# Patient Record
Sex: Female | Born: 1935 | Race: White | Hispanic: No | Smoking: Former smoker
Health system: Southern US, Community
[De-identification: ages and names within clinical notes are randomized; demographics above are authoritative.]

## PROBLEM LIST (undated history)

## (undated) DIAGNOSIS — N289 Disorder of kidney and ureter, unspecified: Secondary | ICD-10-CM

## (undated) DIAGNOSIS — E039 Hypothyroidism, unspecified: Secondary | ICD-10-CM

## (undated) DIAGNOSIS — C189 Malignant neoplasm of colon, unspecified: Secondary | ICD-10-CM

## (undated) DIAGNOSIS — I359 Nonrheumatic aortic valve disorder, unspecified: Secondary | ICD-10-CM

## (undated) DIAGNOSIS — I495 Sick sinus syndrome: Secondary | ICD-10-CM

## (undated) DIAGNOSIS — E785 Hyperlipidemia, unspecified: Secondary | ICD-10-CM

## (undated) DIAGNOSIS — K219 Gastro-esophageal reflux disease without esophagitis: Secondary | ICD-10-CM

## (undated) DIAGNOSIS — I10 Essential (primary) hypertension: Secondary | ICD-10-CM

## (undated) DIAGNOSIS — I701 Atherosclerosis of renal artery: Secondary | ICD-10-CM

## (undated) DIAGNOSIS — I2510 Atherosclerotic heart disease of native coronary artery without angina pectoris: Secondary | ICD-10-CM

## (undated) DIAGNOSIS — I219 Acute myocardial infarction, unspecified: Secondary | ICD-10-CM

## (undated) DIAGNOSIS — C50919 Malignant neoplasm of unspecified site of unspecified female breast: Secondary | ICD-10-CM

## (undated) HISTORY — DX: Gastro-esophageal reflux disease without esophagitis: K21.9

## (undated) HISTORY — DX: Disorder of kidney and ureter, unspecified: N28.9

## (undated) HISTORY — DX: Malignant neoplasm of colon, unspecified: C18.9

## (undated) HISTORY — PX: OTHER SURGICAL HISTORY: SHX169

## (undated) HISTORY — DX: Hyperlipidemia, unspecified: E78.5

## (undated) HISTORY — PX: ABDOMINAL HYSTERECTOMY: SHX81

## (undated) HISTORY — DX: Atherosclerosis of renal artery: I70.1

## (undated) HISTORY — DX: Sick sinus syndrome: I49.5

## (undated) HISTORY — PX: TONSILLECTOMY: SUR1361

## (undated) HISTORY — PX: AORTIC VALVE REPLACEMENT: SHX41

## (undated) HISTORY — DX: Malignant neoplasm of unspecified site of unspecified female breast: C50.919

## (undated) HISTORY — PX: PACEMAKER INSERTION: SHX728

## (undated) HISTORY — PX: CORONARY ARTERY BYPASS GRAFT: SHX141

## (undated) HISTORY — DX: Essential (primary) hypertension: I10

## (undated) HISTORY — DX: Atherosclerotic heart disease of native coronary artery without angina pectoris: I25.10

## (undated) HISTORY — DX: Hypothyroidism, unspecified: E03.9

## (undated) HISTORY — DX: Nonrheumatic aortic valve disorder, unspecified: I35.9

---

## 2002-01-11 ENCOUNTER — Ambulatory Visit (HOSPITAL_COMMUNITY): Admission: RE | Admit: 2002-01-11 | Discharge: 2002-01-11 | Payer: Self-pay | Admitting: Internal Medicine

## 2002-04-11 ENCOUNTER — Ambulatory Visit (HOSPITAL_COMMUNITY): Admission: RE | Admit: 2002-04-11 | Discharge: 2002-04-11 | Payer: Self-pay | Admitting: Internal Medicine

## 2002-10-23 ENCOUNTER — Inpatient Hospital Stay (HOSPITAL_COMMUNITY): Admission: EM | Admit: 2002-10-23 | Discharge: 2002-10-24 | Payer: Self-pay | Admitting: *Deleted

## 2002-10-23 ENCOUNTER — Encounter: Payer: Self-pay | Admitting: Emergency Medicine

## 2002-10-24 ENCOUNTER — Encounter: Payer: Self-pay | Admitting: Cardiology

## 2003-06-06 ENCOUNTER — Inpatient Hospital Stay (HOSPITAL_COMMUNITY): Admission: EM | Admit: 2003-06-06 | Discharge: 2003-07-04 | Payer: Self-pay | Admitting: Emergency Medicine

## 2003-06-14 ENCOUNTER — Encounter: Payer: Self-pay | Admitting: Cardiology

## 2003-06-15 ENCOUNTER — Encounter (INDEPENDENT_AMBULATORY_CARE_PROVIDER_SITE_OTHER): Payer: Self-pay | Admitting: Specialist

## 2003-06-19 ENCOUNTER — Encounter: Payer: Self-pay | Admitting: Thoracic Surgery (Cardiothoracic Vascular Surgery)

## 2003-06-27 ENCOUNTER — Encounter: Payer: Self-pay | Admitting: Cardiovascular Disease

## 2003-07-09 ENCOUNTER — Inpatient Hospital Stay (HOSPITAL_COMMUNITY): Admission: EM | Admit: 2003-07-09 | Discharge: 2003-07-11 | Payer: Self-pay | Admitting: Emergency Medicine

## 2003-07-24 ENCOUNTER — Ambulatory Visit (HOSPITAL_COMMUNITY): Admission: RE | Admit: 2003-07-24 | Discharge: 2003-07-24 | Payer: Self-pay | Admitting: *Deleted

## 2003-07-31 ENCOUNTER — Ambulatory Visit (HOSPITAL_COMMUNITY): Admission: RE | Admit: 2003-07-31 | Discharge: 2003-07-31 | Payer: Self-pay | Admitting: *Deleted

## 2003-08-10 ENCOUNTER — Encounter (HOSPITAL_COMMUNITY): Admission: RE | Admit: 2003-08-10 | Discharge: 2003-09-09 | Payer: Self-pay | Admitting: *Deleted

## 2003-09-08 ENCOUNTER — Encounter (HOSPITAL_COMMUNITY): Admission: RE | Admit: 2003-09-08 | Discharge: 2003-10-08 | Payer: Self-pay | Admitting: *Deleted

## 2003-10-09 ENCOUNTER — Encounter (HOSPITAL_COMMUNITY): Admission: RE | Admit: 2003-10-09 | Discharge: 2003-11-08 | Payer: Self-pay | Admitting: *Deleted

## 2003-11-10 ENCOUNTER — Encounter (HOSPITAL_COMMUNITY): Admission: RE | Admit: 2003-11-10 | Discharge: 2003-12-10 | Payer: Self-pay | Admitting: *Deleted

## 2003-12-11 ENCOUNTER — Ambulatory Visit (HOSPITAL_COMMUNITY): Admission: RE | Admit: 2003-12-11 | Discharge: 2003-12-11 | Payer: Self-pay | Admitting: Family Medicine

## 2004-02-11 ENCOUNTER — Inpatient Hospital Stay (HOSPITAL_COMMUNITY): Admission: EM | Admit: 2004-02-11 | Discharge: 2004-02-17 | Payer: Self-pay | Admitting: Emergency Medicine

## 2004-07-22 ENCOUNTER — Ambulatory Visit: Payer: Self-pay | Admitting: *Deleted

## 2004-07-22 ENCOUNTER — Ambulatory Visit: Payer: Self-pay | Admitting: Internal Medicine

## 2004-09-04 ENCOUNTER — Ambulatory Visit: Payer: Self-pay | Admitting: Cardiovascular Disease

## 2005-07-23 ENCOUNTER — Ambulatory Visit: Payer: Self-pay | Admitting: *Deleted

## 2005-08-14 ENCOUNTER — Ambulatory Visit: Payer: Self-pay | Admitting: Orthopedic Surgery

## 2005-08-14 ENCOUNTER — Observation Stay (HOSPITAL_COMMUNITY): Admission: EM | Admit: 2005-08-14 | Discharge: 2005-08-16 | Payer: Self-pay | Admitting: Emergency Medicine

## 2005-08-21 ENCOUNTER — Ambulatory Visit: Payer: Self-pay | Admitting: Orthopedic Surgery

## 2005-08-25 ENCOUNTER — Ambulatory Visit: Payer: Self-pay | Admitting: Orthopedic Surgery

## 2005-09-04 ENCOUNTER — Ambulatory Visit: Payer: Self-pay | Admitting: Cardiology

## 2005-10-01 ENCOUNTER — Ambulatory Visit: Payer: Self-pay | Admitting: Orthopedic Surgery

## 2005-10-13 ENCOUNTER — Encounter (HOSPITAL_COMMUNITY): Admission: RE | Admit: 2005-10-13 | Discharge: 2005-11-12 | Payer: Self-pay | Admitting: Orthopedic Surgery

## 2005-10-29 ENCOUNTER — Ambulatory Visit: Payer: Self-pay | Admitting: Orthopedic Surgery

## 2005-11-14 ENCOUNTER — Encounter (HOSPITAL_COMMUNITY): Admission: RE | Admit: 2005-11-14 | Discharge: 2005-12-14 | Payer: Self-pay | Admitting: Orthopedic Surgery

## 2006-01-07 ENCOUNTER — Ambulatory Visit: Payer: Self-pay | Admitting: Internal Medicine

## 2006-02-04 ENCOUNTER — Ambulatory Visit: Payer: Self-pay | Admitting: Internal Medicine

## 2006-03-11 ENCOUNTER — Ambulatory Visit: Payer: Self-pay | Admitting: Internal Medicine

## 2006-04-08 ENCOUNTER — Ambulatory Visit: Payer: Self-pay | Admitting: Internal Medicine

## 2006-04-09 ENCOUNTER — Ambulatory Visit: Payer: Self-pay | Admitting: Cardiology

## 2006-04-22 ENCOUNTER — Ambulatory Visit: Payer: Self-pay

## 2006-04-22 ENCOUNTER — Encounter: Payer: Self-pay | Admitting: Cardiology

## 2006-05-13 ENCOUNTER — Ambulatory Visit: Payer: Self-pay | Admitting: Internal Medicine

## 2006-05-20 ENCOUNTER — Ambulatory Visit: Payer: Self-pay | Admitting: Cardiology

## 2006-06-10 ENCOUNTER — Ambulatory Visit: Payer: Self-pay | Admitting: Internal Medicine

## 2006-07-08 ENCOUNTER — Ambulatory Visit: Payer: Self-pay | Admitting: Internal Medicine

## 2006-08-12 ENCOUNTER — Ambulatory Visit: Payer: Self-pay | Admitting: Internal Medicine

## 2006-09-02 ENCOUNTER — Ambulatory Visit: Payer: Self-pay | Admitting: Internal Medicine

## 2006-12-04 ENCOUNTER — Ambulatory Visit: Payer: Self-pay | Admitting: Internal Medicine

## 2007-03-03 ENCOUNTER — Ambulatory Visit: Payer: Self-pay | Admitting: Internal Medicine

## 2007-06-02 ENCOUNTER — Ambulatory Visit: Payer: Self-pay | Admitting: Internal Medicine

## 2007-06-03 ENCOUNTER — Ambulatory Visit: Payer: Self-pay | Admitting: Cardiology

## 2007-06-03 LAB — CONVERTED CEMR LAB
AST: 18 units/L (ref 0–37)
BUN: 26 mg/dL — ABNORMAL HIGH (ref 6–23)
Bilirubin, Direct: 0.1 mg/dL (ref 0.0–0.3)
CO2: 23 meq/L (ref 19–32)
Calcium: 8.8 mg/dL (ref 8.4–10.5)
Chloride: 110 meq/L (ref 96–112)
Cholesterol: 124 mg/dL (ref 0–200)
Creatinine, Ser: 1.7 mg/dL — ABNORMAL HIGH (ref 0.4–1.2)
GFR calc non Af Amer: 31 mL/min
Sodium: 139 meq/L (ref 135–145)
Total Bilirubin: 0.7 mg/dL (ref 0.3–1.2)
Total CHOL/HDL Ratio: 2.5
Total Protein: 6.8 g/dL (ref 6.0–8.3)

## 2007-06-15 ENCOUNTER — Encounter: Payer: Self-pay | Admitting: Cardiology

## 2007-06-15 ENCOUNTER — Ambulatory Visit: Payer: Self-pay

## 2007-08-27 ENCOUNTER — Ambulatory Visit: Payer: Self-pay | Admitting: Internal Medicine

## 2007-11-24 ENCOUNTER — Ambulatory Visit: Payer: Self-pay | Admitting: Internal Medicine

## 2008-03-22 ENCOUNTER — Ambulatory Visit: Payer: Self-pay | Admitting: Cardiology

## 2008-03-31 ENCOUNTER — Ambulatory Visit: Payer: Self-pay | Admitting: Cardiology

## 2008-03-31 ENCOUNTER — Encounter: Payer: Self-pay | Admitting: Cardiology

## 2008-03-31 ENCOUNTER — Ambulatory Visit: Payer: Self-pay

## 2008-03-31 LAB — CONVERTED CEMR LAB
ALT: 15 units/L (ref 0–35)
Albumin: 3.8 g/dL (ref 3.5–5.2)
Alkaline Phosphatase: 60 units/L (ref 39–117)
CO2: 23 meq/L (ref 19–32)
Calcium: 8.5 mg/dL (ref 8.4–10.5)
Cholesterol: 132 mg/dL (ref 0–200)
Creatinine, Ser: 1.7 mg/dL — ABNORMAL HIGH (ref 0.4–1.2)
GFR calc Af Amer: 37 mL/min
GFR calc non Af Amer: 30 mL/min
LDL Cholesterol: 61 mg/dL (ref 0–99)
Total Bilirubin: 0.7 mg/dL (ref 0.3–1.2)
Total Protein: 6.5 g/dL (ref 6.0–8.3)
Triglycerides: 95 mg/dL (ref 0–149)
VLDL: 19 mg/dL (ref 0–40)

## 2008-06-29 ENCOUNTER — Ambulatory Visit: Payer: Self-pay | Admitting: Internal Medicine

## 2008-08-18 ENCOUNTER — Encounter: Payer: Self-pay | Admitting: Internal Medicine

## 2008-08-22 ENCOUNTER — Ambulatory Visit: Payer: Self-pay | Admitting: Internal Medicine

## 2008-11-06 ENCOUNTER — Encounter (INDEPENDENT_AMBULATORY_CARE_PROVIDER_SITE_OTHER): Payer: Self-pay | Admitting: *Deleted

## 2008-11-22 ENCOUNTER — Ambulatory Visit: Payer: Self-pay | Admitting: Internal Medicine

## 2008-11-30 ENCOUNTER — Encounter: Payer: Self-pay | Admitting: Internal Medicine

## 2008-12-23 DIAGNOSIS — I2510 Atherosclerotic heart disease of native coronary artery without angina pectoris: Secondary | ICD-10-CM | POA: Insufficient documentation

## 2008-12-23 DIAGNOSIS — C189 Malignant neoplasm of colon, unspecified: Secondary | ICD-10-CM | POA: Insufficient documentation

## 2008-12-23 DIAGNOSIS — C50919 Malignant neoplasm of unspecified site of unspecified female breast: Secondary | ICD-10-CM | POA: Insufficient documentation

## 2008-12-23 DIAGNOSIS — I498 Other specified cardiac arrhythmias: Secondary | ICD-10-CM

## 2008-12-23 DIAGNOSIS — E039 Hypothyroidism, unspecified: Secondary | ICD-10-CM

## 2008-12-23 DIAGNOSIS — K219 Gastro-esophageal reflux disease without esophagitis: Secondary | ICD-10-CM | POA: Insufficient documentation

## 2008-12-23 DIAGNOSIS — R0989 Other specified symptoms and signs involving the circulatory and respiratory systems: Secondary | ICD-10-CM | POA: Insufficient documentation

## 2008-12-23 DIAGNOSIS — I10 Essential (primary) hypertension: Secondary | ICD-10-CM | POA: Insufficient documentation

## 2008-12-23 DIAGNOSIS — I459 Conduction disorder, unspecified: Secondary | ICD-10-CM

## 2008-12-23 DIAGNOSIS — M810 Age-related osteoporosis without current pathological fracture: Secondary | ICD-10-CM | POA: Insufficient documentation

## 2008-12-23 DIAGNOSIS — E785 Hyperlipidemia, unspecified: Secondary | ICD-10-CM | POA: Insufficient documentation

## 2008-12-23 DIAGNOSIS — N189 Chronic kidney disease, unspecified: Secondary | ICD-10-CM

## 2008-12-29 ENCOUNTER — Ambulatory Visit: Payer: Self-pay | Admitting: Cardiology

## 2008-12-29 DIAGNOSIS — Z954 Presence of other heart-valve replacement: Secondary | ICD-10-CM | POA: Insufficient documentation

## 2008-12-29 DIAGNOSIS — I079 Rheumatic tricuspid valve disease, unspecified: Secondary | ICD-10-CM | POA: Insufficient documentation

## 2008-12-29 DIAGNOSIS — Z950 Presence of cardiac pacemaker: Secondary | ICD-10-CM | POA: Insufficient documentation

## 2008-12-29 DIAGNOSIS — R0602 Shortness of breath: Secondary | ICD-10-CM

## 2008-12-29 DIAGNOSIS — I701 Atherosclerosis of renal artery: Secondary | ICD-10-CM

## 2009-01-16 ENCOUNTER — Telehealth (INDEPENDENT_AMBULATORY_CARE_PROVIDER_SITE_OTHER): Payer: Self-pay | Admitting: Radiology

## 2009-01-17 ENCOUNTER — Ambulatory Visit: Payer: Self-pay

## 2009-01-17 ENCOUNTER — Ambulatory Visit: Payer: Self-pay | Admitting: Cardiology

## 2009-01-17 ENCOUNTER — Encounter: Payer: Self-pay | Admitting: Cardiovascular Disease

## 2009-02-07 ENCOUNTER — Telehealth: Payer: Self-pay | Admitting: Cardiology

## 2009-02-21 ENCOUNTER — Ambulatory Visit: Payer: Self-pay | Admitting: Internal Medicine

## 2009-02-23 ENCOUNTER — Encounter: Payer: Self-pay | Admitting: Internal Medicine

## 2009-03-28 ENCOUNTER — Encounter: Payer: Self-pay | Admitting: Cardiology

## 2009-03-29 ENCOUNTER — Encounter: Payer: Self-pay | Admitting: Internal Medicine

## 2009-04-02 ENCOUNTER — Ambulatory Visit (HOSPITAL_COMMUNITY): Admission: RE | Admit: 2009-04-02 | Discharge: 2009-04-02 | Payer: Self-pay | Admitting: Family Medicine

## 2009-05-23 ENCOUNTER — Ambulatory Visit: Payer: Self-pay | Admitting: Internal Medicine

## 2009-05-28 ENCOUNTER — Encounter: Payer: Self-pay | Admitting: Internal Medicine

## 2009-09-19 ENCOUNTER — Ambulatory Visit: Payer: Self-pay | Admitting: Internal Medicine

## 2009-10-16 ENCOUNTER — Ambulatory Visit: Payer: Self-pay | Admitting: Cardiology

## 2009-11-02 ENCOUNTER — Ambulatory Visit (HOSPITAL_COMMUNITY): Admission: RE | Admit: 2009-11-02 | Discharge: 2009-11-02 | Payer: Self-pay | Admitting: Cardiology

## 2009-11-02 ENCOUNTER — Ambulatory Visit: Payer: Self-pay | Admitting: Cardiovascular Disease

## 2009-11-02 ENCOUNTER — Encounter: Payer: Self-pay | Admitting: Cardiology

## 2009-11-02 ENCOUNTER — Ambulatory Visit: Payer: Self-pay

## 2009-11-14 ENCOUNTER — Telehealth: Payer: Self-pay | Admitting: Cardiology

## 2009-12-19 ENCOUNTER — Ambulatory Visit: Payer: Self-pay | Admitting: Internal Medicine

## 2010-01-01 ENCOUNTER — Encounter: Payer: Self-pay | Admitting: Internal Medicine

## 2010-03-27 ENCOUNTER — Ambulatory Visit: Payer: Self-pay | Admitting: Internal Medicine

## 2010-03-28 ENCOUNTER — Encounter: Payer: Self-pay | Admitting: Internal Medicine

## 2010-07-02 ENCOUNTER — Encounter (INDEPENDENT_AMBULATORY_CARE_PROVIDER_SITE_OTHER): Payer: Self-pay | Admitting: *Deleted

## 2010-07-05 ENCOUNTER — Ambulatory Visit
Admission: RE | Admit: 2010-07-05 | Discharge: 2010-07-05 | Payer: Self-pay | Source: Home / Self Care | Attending: Internal Medicine | Admitting: Internal Medicine

## 2010-07-05 ENCOUNTER — Encounter: Payer: Self-pay | Admitting: Internal Medicine

## 2010-07-21 LAB — CONVERTED CEMR LAB
BUN: 29 mg/dL — ABNORMAL HIGH (ref 6–23)
CO2: 25 meq/L (ref 19–32)
Creatinine, Ser: 1.4 mg/dL — ABNORMAL HIGH (ref 0.4–1.2)

## 2010-07-23 NOTE — Progress Notes (Signed)
Summary: refill meds   Phone Note Refill Request Call back at Home Phone [phone] Message from:  Patient on Nov 14, 2009 11:03 AM  Refills Requested: Medication #1:  LISINOPRIL 20 MG TABS Take one tablet by mouth daily  Medication #2:  NIASPAN 1000 MG CR-TABS 1 tab by mouth once daily medco 90 suppy with 3 refill.    Method Requested: Fax to [facility] Initial call taken by: Neil Crouch,  Nov 14, 2009 11:04 AM    Prescriptions: LISINOPRIL 20 MG TABS (LISINOPRIL) Take one tablet by mouth daily  #90 x 3   Entered by:   Burnett Kanaris   Authorized by:   Colin Mulders, MD, [facility]   Signed by:   Burnett Kanaris on 11/14/2009   Method used:   Electronically to        [facility] (mail-order)             ,          Ph: [phone]       Fax: [phone]   [id number] [place] 1000 MG CR-TABS (NIACIN (ANTIHYPERLIPIDEMIC)) 1 tab by mouth once daily  #90 x 3   Entered by:   Burnett Kanaris   Authorized by:   Colin Mulders, MD, [facility]   Signed by:   Burnett Kanaris on 11/14/2009   Method used:   Electronically to        [facility] (mail-order)             ,          Ph: [phone]       Fax: [phone]   [id number]
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
## 2010-07-23 NOTE — Letter (Signed)
Summary: Remote Device Check  [facility], [place]  [id number] [place]   [place], [place] [address]   Phone: [phone]  Fax: [phone]     January 01, 2010 MRN: [id number]   Alexandra Hall, [place]  [address]   Dear [name],   Your remote transmission was recieved and reviewed by your physician.  All diagnostics were within normal limits for you.   __X____Your next office visit is scheduled for:   September 2011 with Dr Lovena Le.                            Please call our office to schedule an appointment.    Sincerely,  Shelly Bombard

## 2010-07-23 NOTE — Cardiovascular Report (Signed)
Summary: Office Visit Remote   Office Visit Remote   Imported By: Sallee Provencal 01/02/2010 12:07:19  _____________________________________________________________________  External Attachment:    Type:   Image     Comment:   External Document
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
## 2010-07-25 NOTE — Letter (Signed)
Summary: Device-Delinquent Phone Proofreader, [place]  [address] [place]   [place], [place] [address]   Phone: [phone]  Fax: [phone]     July 02, 2010 MRN: [id number]   [place] [address] [place], [place]  [address]   Dear Ms. DENHOLM,  According to our records, you were scheduled for a device phone transmission on 06-27-2010.     We did not receive any results from this check.  If you transmitted on your scheduled day, please call us to help troubleshoot your system.  If you forgot to send your transmission, please send one upon receipt of this letter.  Thank you,   [facility]

## 2010-08-04 ENCOUNTER — Encounter (INDEPENDENT_AMBULATORY_CARE_PROVIDER_SITE_OTHER): Payer: Self-pay | Admitting: *Deleted

## 2010-08-14 NOTE — Letter (Signed)
Summary: Remote Device Check  [facility], [place]  [id number] [place]   [place], [place] [address]   Phone: [phone]  Fax: [phone]     August 04, 2010 MRN: [id number]   [place], [place]  [address]   Dear Ms. MUNDIE,   Your remote transmission was recieved and reviewed by your physician.  All diagnostics were within normal limits for you.  __X___Your next transmission is scheduled for: 10-03-2010.  Please transmit at any time this day.  If you have a wireless device your transmission will be sent automatically.   Sincerely,  Shelly Bombard

## 2010-08-14 NOTE — Cardiovascular Report (Signed)
Summary: Office Visit Remote   Office Visit Remote   Imported By: Sallee Provencal 08/06/2010 14:58:59  _____________________________________________________________________  External Attachment:    Type:   Image     Comment:   External Document

## 2010-10-03 ENCOUNTER — Other Ambulatory Visit: Payer: Self-pay | Admitting: Internal Medicine

## 2010-10-03 ENCOUNTER — Ambulatory Visit (INDEPENDENT_AMBULATORY_CARE_PROVIDER_SITE_OTHER): Payer: Medicare Other | Admitting: *Deleted

## 2010-10-03 DIAGNOSIS — I495 Sick sinus syndrome: Secondary | ICD-10-CM

## 2010-10-08 ENCOUNTER — Encounter: Payer: Self-pay | Admitting: Cardiology

## 2010-10-09 ENCOUNTER — Ambulatory Visit (INDEPENDENT_AMBULATORY_CARE_PROVIDER_SITE_OTHER): Payer: Medicare Other | Admitting: Cardiology

## 2010-10-09 ENCOUNTER — Encounter: Payer: Self-pay | Admitting: Cardiology

## 2010-10-09 DIAGNOSIS — I359 Nonrheumatic aortic valve disorder, unspecified: Secondary | ICD-10-CM

## 2010-10-09 DIAGNOSIS — I701 Atherosclerosis of renal artery: Secondary | ICD-10-CM

## 2010-10-09 MED ORDER — LISINOPRIL 40 MG PO TABS: 40.0000 mg | ORAL_TABLET | Freq: Every day | ORAL | Status: DC

## 2010-10-09 NOTE — Assessment & Plan Note (Signed)
Continue statin. Lipids and liver monitored by primary care. 

## 2010-10-09 NOTE — Assessment & Plan Note (Signed)
Continued SBE prophylaxis. Followup echocardiogram both for AVR and tricuspid valve repair.

## 2010-10-09 NOTE — Assessment & Plan Note (Signed)
Management per electrophysiology. 

## 2010-10-09 NOTE — Progress Notes (Signed)
HPI:Alexandra Hall is a very pleasant  who I have seen in the past for coronary artery disease status post coronary bypass graft, history of aortic valve replacement, as well as tricuspid valve repair.  This was performed in December of 2004. Most recent Myoview was performed in July 2010.  At that time her ejection fraction was 64% and there was no ischemia or infarction. Last echocardiogram was performed in May of 2011. At that time her LV function was normal. The prosthetic aortic valve was functioning appropriately with no AI;  mean gradient of 22 mm of mercury; no TR. She also has a history of pacemaker. Renal Dopplers in July of 2010 revealed very mild 1-59% bilateral stenosis.  I last saw her in April of 2011. Since then she has dyspnea on exertion but unchanged compared to previous. It is relieved with rest. There is no associated chest pain. There is no orthopnea, PND, palpitations or syncope. Mild pedal edema.   Current Outpatient Prescriptions  Medication Sig Dispense Refill  . aspirin 81 MG tablet Take 81 mg by mouth daily.        Marland Kitchen azithromycin (ZITHROMAX) 250 MG tablet Take 2 tablets by mouth on day 1, followed by 1 tablet by mouth daily for 4 days.      Marland Kitchen CALCIUM-VITAMIN D PO Take 1 tablet by mouth daily.        . chlorthalidone (HYGROTON) 25 MG tablet Take 25 mg by mouth daily.        . diphenhydrAMINE (BENADRYL) 25 mg capsule Take 25 mg by mouth at bedtime as needed.        . ezetimibe-simvastatin (VYTORIN) 10-40 MG per tablet Take 1 tablet by mouth at bedtime.        . ibandronate (BONIVA) 150 MG tablet Take 150 mg by mouth every 30 (thirty) days. Take in the morning with a full glass of water, on an empty stomach, and do not take anything else by mouth or lie down for the next 30 min.       Marland Kitchen levothyroxine (SYNTHROID, LEVOTHROID) 88 MCG tablet Take 88 mcg by mouth daily.        Marland Kitchen lisinopril (PRINIVIL,ZESTRIL) 20 MG tablet Take 20 mg by mouth daily.        . metoprolol tartrate  (LOPRESSOR) 25 MG tablet Take 25 mg by mouth 2 (two) times daily.        . niacin (NIASPAN) 1000 MG CR tablet Take 1,000 mg by mouth at bedtime.        . vitamin B-12 (CYANOCOBALAMIN) 1000 MCG tablet Take 1,000 mcg by mouth daily.           Past Medical History  Diagnosis Date  . CAD (coronary artery disease)   . Hypothyroidism   . Renal insufficiency   . Colon cancer   . HTN (hypertension)   . HLD (hyperlipidemia)   . GERD (gastroesophageal reflux disease)   . Osteoporosis   . Breast cancer   . Sinus node dysfunction   . Renal artery stenosis     Past Surgical History  Procedure Date  . Coronary artery bypass graft     12/04  . Aortic valve replacement     12/04  . Tricuspid valve repair     12/04  . Pacemaker insertion     History   Social History  . Marital Status: Widowed    Spouse Name: N/A    Number of Children: N/A  . Years of Education:  N/A   Occupational History  . Not on file.   Social History Main Topics  . Smoking status: Never Smoker   . Smokeless tobacco: Not on file  . Alcohol Use: No  . Drug Use: Not on file  . Sexually Active: Not on file   Other Topics Concern  . Not on file   Social History Narrative  . No narrative on file    ROS: no fevers or chills, productive cough, hemoptysis, dysphasia, odynophagia, melena, hematochezia, dysuria, hematuria, rash, seizure activity, orthopnea, PND,  claudication. Remaining systems are negative.  Physical Exam: Well-developed well-nourished in no acute distress.  Skin is warm and dry.  HEENT is normal.  Neck is supple. No thyromegaly.  Chest is clear to auscultation with normal expansion.  Cardiovascular exam is regular rate and rhythm. 2/6 SM left sternal border. Abdominal exam nontender or distended. No masses palpated. Extremities show no edema. neuro grossly intact  ECG  Atrial paced rhythm, left axis deviation,prior anterior infarct.

## 2010-10-09 NOTE — Assessment & Plan Note (Signed)
Repeat echo

## 2010-10-09 NOTE — Assessment & Plan Note (Signed)
Schedule follow-up renal Dopplers. 

## 2010-10-09 NOTE — Assessment & Plan Note (Signed)
Blood pressure elevated. Increase lisinopril to 40 mg p.o. Daily. Check potassium and renal function in one week.

## 2010-10-09 NOTE — Patient Instructions (Signed)
Your physician recommends that you schedule a follow-up appointment in: [place] LISINOPRIL TO 40MG  ONCE DAILY  Your physician has requested that you have an echocardiogram. Echocardiography is a painless test that uses sound waves to create images of your heart. It provides your doctor with information about the size and shape of your heart and how well your heart's chambers and valves are working. This procedure takes approximately one hour. There are no restrictions for this procedure.    Your physician has requested that you have a renal artery duplex. During this test, an ultrasound is used to evaluate blood flow to the kidneys. Allow one hour for this exam. Do not eat after midnight the day before and avoid carbonated beverages. Take your medications as you usually do.   Your physician recommends that you return for lab work in: [place]

## 2010-10-09 NOTE — Assessment & Plan Note (Signed)
Continue aspirin and statin. 

## 2010-10-10 NOTE — Progress Notes (Signed)
Pacer remote

## 2010-10-16 ENCOUNTER — Other Ambulatory Visit: Payer: Self-pay | Admitting: Cardiology

## 2010-10-16 LAB — BASIC METABOLIC PANEL
BUN: 28 mg/dL — ABNORMAL HIGH (ref 6–23)
CO2: 23 mEq/L (ref 19–32)
Calcium: 9 mg/dL (ref 8.4–10.5)
Chloride: 101 mEq/L (ref 96–112)
Creat: 1.49 mg/dL — ABNORMAL HIGH (ref 0.40–1.20)
Glucose, Bld: 99 mg/dL (ref 70–99)
Potassium: 4.5 mEq/L (ref 3.5–5.3)
Sodium: 135 mEq/L (ref 135–145)

## 2010-10-22 ENCOUNTER — Encounter: Payer: Self-pay | Admitting: *Deleted

## 2010-10-24 ENCOUNTER — Ambulatory Visit (HOSPITAL_COMMUNITY): Payer: Medicare Other | Attending: Cardiology | Admitting: Radiology

## 2010-10-24 ENCOUNTER — Encounter (INDEPENDENT_AMBULATORY_CARE_PROVIDER_SITE_OTHER): Payer: Medicare Other | Admitting: Cardiology

## 2010-10-24 DIAGNOSIS — I2510 Atherosclerotic heart disease of native coronary artery without angina pectoris: Secondary | ICD-10-CM

## 2010-10-24 DIAGNOSIS — I359 Nonrheumatic aortic valve disorder, unspecified: Secondary | ICD-10-CM | POA: Insufficient documentation

## 2010-10-24 DIAGNOSIS — I701 Atherosclerosis of renal artery: Secondary | ICD-10-CM

## 2010-10-29 ENCOUNTER — Encounter: Payer: Self-pay | Admitting: Cardiology

## 2010-11-05 NOTE — Assessment & Plan Note (Signed)
Alexandra OFFICE NOTE   Hall, Alexandra Hall                       MRN:          [id number]  DATE:08/27/2007                            DOB:          May 07, 1922    Alexandra Hall returns today for follow-up.  She is a very pleasant elderly  woman with history of symptomatic bradycardia status post pacemaker  insertion.  She also has a history of aortic valve replacement in the  past.  She returns today for follow-up.  She denies chest pain or  shortness of breath.  She had no specific complaints today except for  problems with her foot swelling up, which has resolved.   MEDICATIONS:  1. Synthroid 112 mcg daily.  2. Aspirin 81 a day.  3. Metoprolol 25 twice daily.  4. Lisinopril 20 a day.  5. Vytorin 10/40 daily.  6. Niaspan 1 gram a day.  7. Chlorthalidone 25 a day.   PHYSICAL EXAMINATION:  GENERAL APPEARANCE:  She is a pleasant elderly-  appearing woman in no acute distress.  VITAL SIGNS:  Blood pressure today was 160/85, the pulse was 80 and  regular, respirations were 18, weight was 155 pounds.  NECK:  No jugular distention.  LUNGS:  Clear bilaterally to auscultation.  No wheezes, rales or rhonchi  are present.  CARDIAC:  Exam revealed a regular rate and rhythm with normal S1 and S2.  EXTREMITIES:  Trace edema on the right, none on the left, no cyanosis or  clubbing.   Interrogation of her pacemaker demonstrates a Medtronic Kappa 900.  The  P-waves were not available secondary to underlying sinus node  dysfunction, the R-waves were 15, the impedance 441 in the atrium, 671  the ventricle threshold 0.75 at 0.4 in the A and 0.5 at 0.4 in the V.  Battery voltage was 2.77 volts.  She was 99.5% A paced.   IMPRESSION:  1. Sinus node dysfunction.  2. History of heart block.  3. Status post AVR   DISCUSSION:  Overall Alexandra Hall is stable and her pacemaker is working  normally.  Will see her back in the  office in 1 year for pacemaker  follow-up.    Champ Mungo. Lovena Le, MD  Electronically Signed   GWT/MedQ  DD: 08/27/2007  DT: 08/29/2007  Job #: [id number]

## 2010-11-05 NOTE — Assessment & Plan Note (Signed)
[facility] OFFICE NOTE   Alexandra, Hall                       MRN:          [id number]  DATE:03/22/2008                            DOB:          01-04-1922    [name] is a very pleasant 86-year female who I have seen in the past  for coronary artery disease status post coronary bypass graft, history  of aortic valve replacement, as well as tricuspid valve repair.  Most  recent Myoview was performed on April 22, 2006.  At that time her  ejection fraction was 56% and there was no ischemia or infarction.  Since I last saw her she apparently is doing well from a symptomatic  standpoint with no dyspnea, chest pain, palpitations, or syncope, and  there is no pedal edema.   MEDICATIONS:  Include,  1. Chlorthalidone 25 mg tablets 2 p.o. daily.  2. Synthroid 125 mcg p.o. daily.  3. Prilosec 20 mg p.o. daily.  4. Benadryl.  5. Vytorin 10/40 daily.  6. Lisinopril 20 mg p.o. daily.  7. Boniva.  8. Niaspan 1 g p.o. daily.  9. Aspirin 81 mg p.o. daily.  10.Lopressor 25 mg p.o. b.i.d.   PHYSICAL EXAMINATION:  VITAL SIGNS:  Today shows a blood pressure that  is elevated to 150/90 and pulse is 77.  HEENT:  Normal.  NECK:  Supple.  CHEST:  Clear.  CARDIOVASCULAR:  Regular rate and rhythm.  There is a 2/6 systolic  murmur at the left sternal border.  There is no diastolic murmur noted.  ABDOMEN:  No tenderness.  EXTREMITIES:  No edema.   Electrocardiogram shows atrial pacing at a rate of 77.  There are no  significant ST changes noted.   DIAGNOSES:  1. Coronary artery disease status post coronary artery bypass graft -      the patient has had no chest pain and we will continue with medical      therapy including her aspirin, statin, ACE inhibitor, and beta-      blocker.  2. Status post aortic valve replacement with pericardial tissue valve      - we will plan to repeat her echocardiogram.  She will  continue      with SBE prophylaxis and we have provided a card for her use today.  3. History of tricuspid valve repair - as per status post aortic valve      replacement with pericardial tissue valve.  4. Hypertension - her blood pressure is elevated today.  However,      daughter has kept records of her pressures at home and they are      adequately controlled on present medications.  We will check her      BMET to follow her potassium and renal function.  5. Hyperlipidemia - she will continue on statin and Niaspan and I will      check lipids and liver.  6. History of intolerance to Lipitor.  7. Status post pacemaker placement - she will follow up with Dr.      Lovena Le concerning this issue.  8. Hypothyroidism.  9. Renal insufficiency.  10.History of gastroesophageal reflux disease.   She will see [place] back in 9 months.     Alexandra Hall Alexandra Breed, MD, Alexandra Hall  Electronically Signed    BSC/MedQ  DD: 03/22/2008  DT: 03/23/2008  Job #: [id number]   cc:   Alexandra Reaper A. Wolfgang Phoenix, MD

## 2010-11-05 NOTE — Assessment & Plan Note (Signed)
[place]                            CARDIOLOGY OFFICE NOTE   [name], [name]                       MRN:          [id number]  DATE:06/03/2007                            DOB:          1921-11-27    Alexandra Hall returns for followup today.  She is a very pleasant female  whom I follow for coronary artery disease status post coronary artery  bypass grafting, status post aortic valve replacement and tricuspid  valve repair.  Since I last saw her, she denies any dyspnea, other than  with more moderate activities.  There is no orthopnea, PND, pedal edema,  pre-syncope, syncope, or chest pain.  The most recent Myoview was  performed in October of 2007.  At that time, her left ventricular  function was normal with an ejection fraction of 56% and there was  normal perfusion.   MEDICATIONS AT PRESENT:  1. Amlodipine 2.5 mg p.o. daily.  2. Chlorthalidone 25 mg p.o. daily.  3. Toprol 25 mg p.o. daily.  4. Synthroid 112 mcg p.o. daily.  5. Prilosec.  6. Benadryl.  7. Vytorin 10/40 daily.  8. Lisinopril 20 mg p.o. daily.  9. Boniva 150 mg monthly.  10.Niaspan 1 g p.o. daily.   PHYSICAL EXAM:  Shows a blood pressure of 143/78, and pulse is 87.  She  weighs 158 pounds.  HEENT:  Normal.  NECK:  Supple.  CHEST:  Clear.  CARDIOVASCULAR:  Regular rate and rhythm.  There is a 1/6 systolic  ejection murmur at the left sternal border.  There is no diastolic  murmur.  Note, she also has bilateral carotid bruits.  ABDOMEN:  No tenderness.  EXTREMITIES:  No edema.   Her electrocardiogram shows atrial pacing at a rate of 80.  There is  left axis deviation.  There are nonspecific ST changes.   DIAGNOSES:  1. Coronary artery disease status post coronary artery bypass grafting      - she has had no chest pain and her Myoview 1 year ago showed      normal perfusion.  We will continue with medical therapy, including      her ACE inhibitor, Toprol, and a Statin.   I have asked her to begin      aspirin 81 mg p.o. daily.  2. Status post aortic valve replacement with pericardial tissue valve      - we will plan to repeat her echocardiogram.  She will continue      with subacute bacterial endocarditis prophylaxis.  3. Status post tricuspid valve repair - as per #2.  4. Hyperthyroidism - her blood pressure is mildly elevated today.      However, they state it runs in the normal range at home.  I have      asked her to track this and we can increase her amlodipine as      needed.  5. Hyperlipidemia - she will continue on her Statin and Niaspan.  We      will check lipids and liver today and adjust as indicated.  We will  also check a BMET given her diuretic use.  6. History of intolerance to Lipitor.  7. Status post pacemaker placement secondary to sick sinus syndrome -      she will follow up with Dr. Lovena Le.  8. Hypothyroidism.  9. Renal insufficiency.  10.History of gastroesophageal reflux disease.  11.Carotid bruits - we will recheck carotid Dopplers.   I will see her back in 9 months.     Denice Bors Stanford Breed, MD, [facility]  Electronically Signed    BSC/MedQ  DD: 06/03/2007  DT: 06/03/2007  Job #: [id number]   cc:   Nicki Reaper A. Wolfgang Phoenix, MD
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
## 2010-11-08 NOTE — H&P (Signed)
NAME:  Alexandra [name], Alexandra [name]                          ACCOUNT NO.:  [id number]   MEDICAL RECORD NO.:  [id number]                   PATIENT TYPE:  INP   LOCATION:  A203                                 FACILITY:  [facility]   PHYSICIAN:  Scott [name]. Wolfgang Phoenix, M.D.               DATE OF BIRTH:  [birth year]   DATE OF ADMISSION:  02/11/2004  DATE OF DISCHARGE:                                HISTORY & PHYSICAL   CHIEF COMPLAINT:  Right upper abdominal discomfort and pain.   The patient states after church started having severe right upper quadrant  nausea, discomfort, pain, combined with multiple episodes of vomiting,  inability to keep anything down, no diarrhea, no fever, no chills. Denied  any chest pressure, tightness, shortness of breath, or sweats. Presented to  the emergency department. Was evaluated in the ED. Was felt to have findings  of cholecystitis.   The patient had known history of cholecystitis symptoms back in December of  2004 and before having her surgery, she ended up having an infarct and had  to go to [facility] and received bypass there. In addition to this, she also  has had an aortic valve replacement, and she also had [name] pacemaker placement  as well. She has been overall medically stable recently with no chest  pressure, tightness, or pain.   MEDICATIONS:  1. Aspirin 81 mg daily.  2. [place].  3. Chlorthalidone 25 two daily.  4. Synthroid 112 mcg daily.  5. Altace 20 mg daily.  6. Prilosec OTC 1 daily.  7. Ultram p.r.n.  8. Lipitor 20 mg daily.  9. Toprol-XL 100 mg daily.   ALLERGIES:  DARVON and STREPTOMYCIN cause side effects.   PAST MEDICAL HISTORY:  1. Five bypass surgeries along with aortic valve replacement December 2004.     It has been recommended in the past that she wait at least 6 months after     surgery before having any repair done.  2. Hypothyroidism.  3. Hyperlipidemia.  4. Osteoporosis.  5. History of esophageal webs.  6. Diverticula.   SOCIAL  HISTORY:  Quit smoking years ago. Does not currently smoke or drink.  Is married.   FAMILY HISTORY:  Colon and breast cancer, hypertension.   REVIEW OF SYSTEMS:  See per above.   PHYSICAL EXAMINATION:  GENERAL:  ND.  HEENT:  Benign.  NECK:  No masses.  CHEST:  CTA. No crackles.  HEART:  Regular.  ABDOMEN:  Soft, moderate right upper tenderness.  EXTREMITIES:  No edema.   LABORATORY DATA:  White count of 12.9, hemoglobin 14.1. Potassium 4.3,  sodium 137, glucose 149. Bilirubin 0.7, SGOT 448, SGPT 131, lipase 8610.   ASSESSMENT/PLAN:  1. Cholecystitis. She will need to have surgery. I do feel from [name] cardiac     standpoint she is stable for surgery but probably will go ahead and have     [facility] see  her from [name] consultation standpoint.  2. Pancreatitis secondary to cholecystitis. Should gradually come down. Puts     her [name] little higher risk for surgery but not [name] lot. Will need to follow     that as well as liver enzymes after wards.  3. Hypertension, stable.  4. Osteoporosis, not an issue for this admission.     ___________________________________________                                         Elayne Snare Wolfgang Phoenix, M.D.   Lennon Alstrom  D:  02/12/2004  T:  02/12/2004  Job:  [id number]
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
## 2010-11-08 NOTE — Discharge Summary (Signed)
NAME:  Alexandra [name], Alexandra [name]                          ACCOUNT NO.:  [id number]   MEDICAL RECORD NO.:  [id number]                   PATIENT TYPE:  INP   LOCATION:  3303                                 FACILITY:  [facility]   PHYSICIAN:  Champ Mungo. Lovena Le, M.D.               DATE OF BIRTH:  1921-12-07   DATE OF ADMISSION:  07/09/2003  DATE OF DISCHARGE:  07/11/2003                                 DISCHARGE SUMMARY   PRIMARY DIAGNOSIS:  Atypical chest pain.   SECONDARY DIAGNOSES:  1. Coronary artery disease status post five vessel coronary artery bypass     grafting December 2004.  2. Ischemic cardiomyopathy of 35% to 40 to 50%.  3. Bio aortic valve replacement.  4. Tricuspid valve repair.  5. Sick sinus syndrome.  6. Atrial fibrillation.  7. Status post permanent pacemaker.  8. Postoperative hypertension.  9. Hypothyroidism.  10.      Peptic ulcer disease.  11.      Diverticulosis.  12.      Hiatal hernia.  13.      Total abdominal hysterectomy for fibroids.   HISTORY OF PRESENT ILLNESS:  This is a 77 year old female admitted to [facility] on June 12, 2003, with non-ST elevated MI.  The following day, catheterization by Dr. Lyndel Safe showed three vessel disease.  LVEF was 35%.  The patient went to the OR for a CABG x5 with an AVR  pericardial valve and tricuspid valve repair with ring.  Also had gallstone  pancreatitis as admitted, recovered well.  The patient developed  bradycardia.  She was AV paced.  She underwent a dual chamber pacemaker,  [employer] type, on July 03, 2003.  She was ultimately discharged home.  She did well until 1 a.m. the morning of admission when she awakened from  sleep with a 10/10 pain in her left shoulder at the pacemaker site,  radiating down her arm, sharp in quality, much different than angina.  No  swelling or redness at the pacemaker site.  No fever or chills.  No trauma  or strains in the left arm.  No shortness of breath,  palpitations or  dizziness.  She took an Ultram without effect.   The patient was admitted to the hospital.  Her CK was 56 with an MB of 2.9,  troponin 0.11.  Blood cultures showed no growth.  Lipid profile showed  cholesterol 225, triglycerides 201, HDL 28, LDL was 157.  The wbc was 7.0,  H&H 10.9 and 32.4, platelets 189.  INR 1.1.  Glucose 119.  Second set of  cardiac enzymes with CK 34, MB 2.6, troponin 0.09.  Third CK 39, MB 2.9, and  troponin 0.10.   The patient was admitted and the following day, evaluated by Dr. Cristopher Peru.  There was no evidence of DVT in that left arm.   She was then discharged to home  the following day in stable condition on all  her previous medications which include:  1. Synthroid 112 mcg daily.  2. Prilosec 20 daily.  3. Altace 10 daily.  4. Enteric coated aspirin 81 daily.  5. Toprol 25 daily.  6. Lipitor 20 nightly.  7. Ultram as needed.   ACTIVITY:  As before.   DIET:  Low fat, low cholesterol, low salt diet.   WOUND CARE:  Same as before.   FOLLOW UP:  She was to follow up with Dr. Cristopher Peru, September 12, 2003, at  11:45 a.m.  at [place], [place], and reschedule with Dr. Wilhemina Cash in  the [place], [place], office on July 20, 2003, at 2 p.m.      Forest Becker, C.R.N.P. [facility]                 Champ Mungo. Lovena Le, M.D.    DS/MEDQ  D:  07/11/2003  T:  07/12/2003  Job:  [id number]   cc:   Scarlett Presto, M.D.  Fax: [facility] [place]. Wolfgang Phoenix, M.D.  [address] [place] [address]  Fax: [phone]   Gilford Raid, M.D.  [address]  [place]  [place] [address]

## 2010-11-08 NOTE — Discharge Summary (Signed)
NAME:  Alexandra [name], Alexandra [name]                ACCOUNT NO.:  [id number]   MEDICAL RECORD NO.:  [id number]          PATIENT TYPE:  INP   LOCATION:  A203                          FACILITY:  [facility]   PHYSICIAN:  Vernon Prey. Tamala Julian, M.D.   DATE OF BIRTH:  August 18, 1921   DATE OF ADMISSION:  02/11/2004  DATE OF DISCHARGE:  08/27/2005LH                                 DISCHARGE SUMMARY   DISCHARGE DIAGNOSES:  1.  Acute biliary pancreatitis.  2.  Cholelithiasis with cholecystitis.  3.  Severe three vessel coronary artery disease, status post five vessel      coronary artery bypass graft.  4.  Valvular heart disease, status post aortic valve replacement and      tricuspid valve repair.  5.  Sick sinus syndrome, status post dual chamber pacemaker placement.  6.  Chronic atrial fibrillation.  7.  Hypertension.  8.  Gastroesophageal reflux disease.  9.  Hypothyroidism.   PROCEDURE:  Laparoscopic cholecystectomy on February 13, 2004.   DISPOSITION:  The patient is discharged home in stable satisfactory  condition.   DISCHARGE MEDICATIONS:  1.  Norvasc 5 mg 1/2 tab daily.  2.  Aspirin 81 mg daily.  3.  Chlorthalidone 25 mg two daily.  4.  Synthroid 112 mcg daily.  5.  Altace 20 mg daily.  6.  Toprol XL 100 mg daily.   HISTORY:  An 77 year old female with a history of acute onset of sever right  upper quadrant pain, nausea, vomiting, without diarrhea.  The patient  presented to the emergency room where on examination she was noted to have  findings suggestive of cholecystitis.  The patient has a known history of  cholecystitis dating back to December 2004.  During that time, she had an  acute myocardial infarction and underwent coronary artery bypass graft.  She  also had aortic valve replacement and pacemaker replacement during that  hospitalization.  The patient has done well from a cardiac standpoint.  Because of her prior cardiac disease, it was felt that she needed to have an  evaluation by cardiology.   This was done by Dr. Wilhemina Cash.  It was Dr. Baird Cancer  opinion that the patient was at moderate risk for procedure, but that she  did not need further cardiac workup because she was stable.  Because her  enzymes were so high, it was elected to allow her pancreatitis to trend  downward.  She finally underwent laparoscopic cholecystectomy on February 13, 2004.  She did exceptionally well in the postoperative period except for  elevation of her SGOT.  This was followed and it trended downward.  By  February 16, 2004, she had good return of bowel movements, she was doing well,  she was afebrile, alkaline phosphatase was 308, SGOT was 177, SGPT was 59,  amylase and lipase were normal.  The following day she was stable.  Her  abdominal examination was benign.  She was tolerating a diet without  difficulty, and she was discharged home to have followup in cardiology in  the office and with Dr. Wolfgang Phoenix.  LCS/MEDQ  D:  03/24/2004  T:  03/24/2004  Job:  [id number]   cc:   Nicki Reaper A. Wolfgang Phoenix, M.D.  [address], [place]  [place] [address]  Fax: [phone]

## 2010-11-08 NOTE — Discharge Summary (Signed)
   NAME:  Alexandra [name], Alexandra [name]                          ACCOUNT NO.:  [id number]   MEDICAL RECORD NO.:  [id number]                   PATIENT TYPE:  INP   LOCATION:  2016                                 FACILITY:  [facility]   PHYSICIAN:  Scott [name]. Wolfgang Phoenix, M.D.               DATE OF BIRTH:  [birth year]   DATE OF ADMISSION:  10/23/2002  DATE OF DISCHARGE:  10/24/2002                                 DISCHARGE SUMMARY   The patient was admitted in on Oct 23, 2002, was treated for chest pain,  thought to have the possibility of unstable angina and cardiology was called  and the patient was transferred to cardiology in [place] in good  condition.                                               Scott [name]. Wolfgang Phoenix, M.D.    Lennon Alstrom  D:  11/21/2002  T:  11/21/2002  Job:  [id number]

## 2010-11-08 NOTE — H&P (Signed)
NAME:  Alexandra [name], Alexandra [name]                          ACCOUNT NO.:  [id number]   MEDICAL RECORD NO.:  [id number]                   PATIENT TYPE:  INP   LOCATION:  2016                                 FACILITY:  [facility]   PHYSICIAN:  Glori Bickers, M.D. [facility]          DATE OF BIRTH:  [birth year]   DATE OF ADMISSION:  10/23/2002  DATE OF DISCHARGE:                                HISTORY & PHYSICAL   PRIMARY CARE DOCTOR:  Dr. Nicki Reaper A. Luking.   CARDIOLOGIST:  She is new to [facility].   PATIENT PROFILE:  The patient is a very pleasant 77 year old white female  with a history of hypertension, hyperlipidemia, peptic ulcer disease and  esophageal ring, status post dilation, but no known history of CAD, who was  transferred from [facility] for further evaluation of chest pain.   She denies previous history of known heart disease except for a heart  murmur.  She has never had a cardiac catheterization or a stress test.  At  baseline, she is fairly active without exertional angina.   Today, around 4 a.m., she woke up with 10/10 ache across her left chest and  there was no associated radiation, shortness of breath, palpitations,  diaphoresis or nausea and vomiting.  She went to [facility] and the  pain was partially relieved with a GI cocktail, but she got total relief  with one sublingual nitroglycerin.  EKG was nondiagnostic.  She has had  three sets of cardiac enzymes subsequently which have been negative.  She  denies any further chest pain.  She also denies any recent long car rides,  airplane trips or prolonged bedrest.   She does have a history of a severe Schatzki's ring which was dilated in  July of 2003; she also had a gastric ulcers at that time.  She was placed on  a PPI and followup EGD in October 2003 showed that the ulcer was well-healed  with an incomplete Schatzki's ring.   REVIEW OF SYSTEMS:  She denies any abdominal pain, fevers, chills, nausea,  vomiting,  melena or CHF.   PAST MEDICAL HISTORY:  1. Hypertension.  2. Hyperlipidemia.  3. History of Schatzki's ring, status post dilation in July of 2003.  4. History of peptic ulcer disease, as per HPI.  5. History of heart murmur.  6. Hypothyroidism.  7. Anxiety.   MEDICATIONS:  1. Chlorthalidone 50 mg a day.  2. Synthroid 112 mcg a day.  3. Crestor 10 mg a day.  4. Adalat XL 30 mg a day.  5. Xanax p.r.n.  6. Prilosec 20 mg a day.   DRUG ALLERGIES:  She has no known drug allergies.  She is intolerant to BETA  BLOCKERS due to bradycardia.   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  She has a history of tobacco but quit over 20 years ago.  She  denies any alcohol.   PHYSICAL EXAMINATION:  GENERAL:  On exam, she is in no acute distress.  VITAL SIGNS:  Temperature is 98.3, blood pressure 140/80, heart rate is 59.  BACK:  She is kyphotic.  NECK:  Her neck is supple.  JVP is approximately 7 cmH2O with prominent c-v  waves.  Carotids are 2+ bilaterally with bilateral bruits over both carotids  and clavicles.  CARDIAC:  Exam shows a regular rate and rhythm with a normal S1 and S2.  She  has a 2/6 early peaking systolic ejection murmur at the right sternal border  and a 2/6 blowing systolic murmur at the left sternal border which increases  with respirations.  LUNGS:  Lungs are clear to auscultation.  ABDOMEN:  Abdomen is benign, nontender and nondistended and no  hepatosplenomegaly and no bruits.  EXTREMITIES:  No cyanosis, clubbing or edema.  They are warm.  Her femoral  pulses are 2+ bilaterally with bilateral bruits.  DP pulses are 2+  bilaterally.   LABORATORY AND ACCESSORY CLINICAL DATA:  White count is 5.3, hematocrit is  35, platelets are 189,000.  Sodium is 133, potassium is 3.4, BUN is 15,  creatinine is 1.0, glucose is 107.  TSH is normal.  Coagulations are normal.  First set of cardiac marker is 104 with an MB of 2.4, troponin 0.01;  second  set -- CK of 99, MB of 2.3, troponin 0.04; and third set -- CK 97, MB of  1.9, troponin of 0.01.   EKG shows sinus bradycardia with heart rate of 50, with approximately 0.5-mm  ST depression in V4 to V6 with no change over serial EKGs.   ASSESSMENT AND PLAN:  This is a 77 year old white female, as above, with  history of hypertension, hyperlipidemia and significant gastrointestinal  problems, who is admitted with acute onset of chest pain with both typical  and atypical features.  She is now with three sets of negative cardiac  enzymes and remains pain-free.  She also has some evidence of peripheral  vascular disease on exam.   Plan will be to:  1. Proceed with treadmill Cardiolite in a.m. to look for ischemic disease.  2. Treat with aspirin, nitroglycerin, statin and Lovenox.  No beta blocker     secondary to bradycardia.  3. Change calcium channel blocker to ACE inhibitor.  4. Check echocardiogram to evaluate valvular disease.  5. Carotid ultrasound to evaluate bruits.  6. Continue proton pump inhibitor.                                               Glori Bickers, M.D. [facility]    DB/MEDQ  D:  10/23/2002  T:  10/24/2002  Job:  [id number]

## 2010-11-08 NOTE — Assessment & Plan Note (Signed)
[facility] OFFICE NOTE   [name], Alexandra                       MRN:          [id number]  DATE:05/20/2006                            DOB:          14-Oct-1921    Alexandra [name] returns for followup today.  We did a nuclear study on her  recently on April 22, 2006.  She was found to have normal perfusion  and an ejection fraction of 56%.  An echocardiogram performed on the  same day showed mildly reduced left ventricular function, a pericardial  tissue valve with no significant AI, with a mean gradient of 13 mmHg.  She had mild tricuspid regurgitation across her tricuspid valve repair.  Since that time, she has not had any dyspnea more than normal.  There is  no orthopnea, PND, pedal edema, palpitations, presyncope, syncope or  chest pain.  We did decrease her chlorthalidone to 25 mg p.o. daily  secondary to mild renal insufficiency.   MEDICATIONS:  1. Boniva 168.75 mg q month.  2. Lisinopril 10 mg p.o. daily.  3. Amlodipine 2.5 mg p.o. daily.  4. Chlorthalidone 25 mg p.o. daily.  5. Metoprolol ER 25 mg p.o. daily.  6. Synthroid 112 mcg p.o. daily.  7. Prilosec.  8. Baby aspirin.  9. Benadryl.  10.Vytorin 10/40 q.h.s.   PHYSICAL EXAMINATION TODAY:  VITAL SIGNS:  Blood pressure of 110/70,  pulse of 65.  NECK:  Her neck is supple.  CHEST:  Clear.  CARDIOVASCULAR:  Regular rate and rhythm.  EXTREMITIES:  Trace edema.   DIAGNOSES:  1. Coronary artery disease status post coronary artery bypass graft.  2. Status post aortic valve replacement with a pericardial tissue      valve.  3. Status post tricuspid valve repair.  4. Hypertension.  5. __________.  6. History of intolerance to Lipitor.  7. Status post pacemaker secondary to sick sinus syndrome.  8. Hypothyroidism.  9. Gastroesophageal reflux disease.  10.Renal insufficiency.   PLAN:  [name] is doing well from a symptomatic standpoint.  We  will  continue with medical therapy.  We did recently change her statin to  Vytorin, and she is scheduled to have lipids and liver checked in  December.  We will adjust with a goal LDL of less than 70, given her  history of coronary disease.  Her blood pressure is much better today.  We will need to follow her renal function in the future.  She will  continue with risk factor modification and followup in the pacemaker  clinic, and I will see her back in 6 months.     Denice Bors Stanford Breed, MD, [facility]  Electronically Signed    BSC/MedQ  DD: 05/20/2006  DT: 05/20/2006  Job #: [id number]   cc:   Sallee Lange, MD

## 2010-11-08 NOTE — Assessment & Plan Note (Signed)
[facility] OFFICE NOTE   HIBAQ, NAKAO                       MRN:          [id number]  DATE:04/09/2006                            DOB:          04-23-1922    [name] is an 77 year old female that has previously been followed by Dr.  Wilhemina Cash.  She has a history of hypertension, hyperlipidemia, coronary artery  disease status post coronary artery bypassing graft, status post aortic  valve replacement secondary to aortic insufficiency (pericardial tissue  valve) and tricuspid valve repair.  She also has had a previous pacemaker  placed secondary to sick sinus syndrome.  Since she was last seen, she has  done reasonably well.  She has dyspnea with more extreme exertion, but not  with routine activities.  There is no orthopnea or PND, and she denies any  pedal edema.  She typically does not have exertional chest pain.  However,  recently she was traveling and developed a dull sensation in her chest that  she thought was potentially gas.  Her pain did not radiate and it resolved  spontaneously.  She presented for establishing with a cardiologist, as Dr.  Wilhemina Cash recently left the practice.   MEDICATIONS:  1. Boniva 168.75 mg.  2. Lisinopril 10 mg p.o. daily.  3. Amlodipine 2.5 mg p.o. daily.  4. Chlorthalidone 50 mg p.o. daily.  5. Metoprolol ER 25 mg p.o. daily.  6. Zocor 40 mg p.o. nightly.  7. Synthroid 112 mcg p.o. daily.  8. Prilosec 20 mg p.o. daily.  9. Baby aspirin 81 mg p.o. daily.  10.Benadryl.   ALLERGIES:  DARVOCET.   SOCIAL HISTORY:  She does not smoke, nor does she consume alcohol.   PHYSICAL EXAMINATION:  Shows a blood pressure of 160/80 and her pulse is 80.  She weighs 155 pounds.  NECK:  Supple, and I cannot appreciate bruits.  CHEST:  Clear.  CARDIOVASCULAR:  Exam reveals a regular rate and rhythm.  There is a 1/6  systolic ejection murmur at the left sternal border.  There is no  diastolic  murmur noted.  ABDOMEN:  Exam is benign.  EXTREMITIES:  Show no edema.   Her electrocardiogram shows atrial pacing at a rate of 85.  There are  nonspecific ST changes noted.  There is poor R wave progression.   DIAGNOSES:  1. Coronary artery disease, status post coronary artery bypassing graft.  2. Status post aortic valve replacement with a pericardial tissue valve.  3. Status post tricuspid valve repair.  4. Hypertension.  5. Hyperlipidemia.  6. History of intolerance to Lipitor.  7. Status post pacemaker secondary to sick sinus syndrome.  8. History of hypothyroidism.  9. Gastroesophageal reflux disease.   PLAN:  Ms. [name] is complaining of atypical chest pain.  We will risk  stratify with an adenosine Myoview.  If it shows no ischemia, we will  continue with medical therapy.  We will plan to repeat her echocardiogram to  assess her prosthetic aortic valve and her tricuspid valve repair.  This  will also help quantify  her left ventricular function.  Her blood pressure  is elevated today, and I will increase her lisinopril to 20 mg p.o. daily  for optimal control.  In one week we will check a CMET (note she has had  some renal insufficiency in the past).  We will also check lipids and adjust  her Zocor as indicated.  Her goal LDL will be less than 70, and she has had  significant hyperlipidemia in the past.  Note she has not tolerated Lipitor  in the past.  She will otherwise continue with risk factor modification,  including diet and exercise.  We will see her back in 6 weeks to check her  blood pressure.  She will also be followed in the pacemaker clinic.    ______________________________  Denice Bors. Stanford Breed, MD, [facility]    BSC/MedQ  DD: 04/09/2006  DT: 04/12/2006  Job #: [id number]   cc:   Nicki Reaper A. Wolfgang Phoenix, MD

## 2010-11-08 NOTE — Discharge Summary (Signed)
NAME:  Alexandra [name], Alexandra [name]                          ACCOUNT NO.:  [id number]   MEDICAL RECORD NO.:  [id number]                   PATIENT TYPE:  INP   LOCATION:  2031                                 FACILITY:  [facility]   PHYSICIAN:  Scott A. Wolfgang Phoenix, M.D.               DATE OF BIRTH:  Dec 22, 1921   DATE OF ADMISSION:  06/06/2003  DATE OF DISCHARGE:  06/12/2003                                 DISCHARGE SUMMARY   DISCHARGE DIAGNOSIS:  1. Acute myocardial infarction.  2. Pancreatitis.  3. Gallstones.   HOSPITAL COURSE:  77 year-old white female was admitted in with  acute biliary pancreatitis and cholecystitis.  Cardiac enzymes were  negative.  Amylase and lipase were markedly elevated.  Ultrasound showed  thickened gallbladder, liver is normal.  She had chest pain back in 2004  which had a negative Cardiolite.  Patient had an episode of back pain on  June 09, 2003, with significant enzyme changes.  It was felt by [facility]  Cardiology that patient had an MI.  They were shooting to transfer the  patient and over the whole weekend unable to transfer because of lack of  beds.  Patient was stable throughout the weekend.  She was transferred on  June 12, 2003 without further event.  Patient was properly treated with  medications to try to help alleviate angina related symptoms.  Finally, on  June 12, 2003, she was transferred.     ___________________________________________                                         Elayne Snare. Wolfgang Phoenix, M.D.   Alexandra [name]  D:  07/13/2003  T:  07/13/2003  Job:  [id number]

## 2010-11-08 NOTE — Procedures (Signed)
[name], Alexandra [name] NO.:  [id number]   MEDICAL RECORD NO.:  [id number]                  PATIENT TYPE:   LOCATION:                                       FACILITY:   PHYSICIAN:  Margaretmary Eddy, M.D.             DATE OF BIRTH:   DATE OF PROCEDURE:  10/23/2002  DATE OF DISCHARGE:                                EKG INTERPRETATION   FINDINGS:  Electrocardiogram reveals sinus bradycardia with nonspecific ST-T  changes.      ___________________________________________                                            Margaretmary Eddy, M.D.   WSL/MEDQ  D:  06/21/2003  T:  06/21/2003  Job:  [id number]

## 2010-11-08 NOTE — H&P (Signed)
NAME:  Alexandra [name], Alexandra [name]                          ACCOUNT NO.:  [id number]   MEDICAL RECORD NO.:  [id number]                   PATIENT TYPE:  INP   LOCATION:  A318                                 FACILITY:  [facility]   PHYSICIAN:  Tammi Sou, M.D.             DATE OF BIRTH:  February 01, 1922   DATE OF ADMISSION:  06/06/2003  DATE OF DISCHARGE:                                HISTORY & PHYSICAL   PRIMARY M.D.:  Dr. Sallee Lange.   CHIEF COMPLAINT:  Chest pain, nausea, and vomiting.   HISTORY OF PRESENT ILLNESS:  Alexandra [name] is a 77 year old white female who was  feeling in her normal state of health this morning and had acute onset of  lower chest and upper abdominal pain after eating breakfast.  She noted no  difficulty until the last bite of her breakfast and had difficulty getting  it down.  She began to feel extremely nauseous and began vomiting up  nonbilious, nonbloody emesis.  She took a nitroglycerin tablet, and this did  not bring any relief.  She began to get sweaty and turned slightly pale per  her daughter's report.  She attempted to take a Vicodin which her daughter  had around and attempted another nitroglycerin but threw these up.  She  waited a significant amount of time, continued vomiting, and finally  presented to the emergency department about six hours after the onset of her  pain.  She had no shortness of breath, no cough, no recent fever.  She did  say the abdominal pain radiated into her back.  In the emergency department  she was evaluated for chest pain and had a normal EKG and cardiac enzymes.  Blood counts revealed evidence of pancreatitis, and I was called for  admission and further evaluation.   PAST MEDICAL HISTORY:  1. Hypertension.  2. Chest pain:  Recent Cardiolite stress test and echocardiogram revealing     no ischemia and normal LV function in 2004.  3. Hypothyroidism.  4. Anxiety.  5. Gastroesophageal reflux disease.  6. Small sliding hiatal hernia.  7.  History of gastric ulcer (this was in July 2003, and an EGD documented     healing in October 2003).  8. Left colonic diverticulosis.  9. Osteoporosis (could not tolerate bisphosphonate).   PAST SURGICAL HISTORY:  1. Hysterectomy done for uterine fibroids in the distant past.  2. Tonsillectomy in the distant past.   MEDICATIONS:  1. Altace 10 mg daily.  2. Chlorthalidone 25 mg daily.  3. Adalat 30 mg daily.  4. Nitroglycerin 0.4 mg p.o. every five minutes x3 p.r.n. chest pain.  5. Synthroid 112 mcg p.o. daily.  6. Prilosec OTC 20 mg p.o. daily.  7. Aspirin 81 mg p.o. daily.  8. Occasional Tylenol p.r.n. back pain.   Of note, no NSAIDs and no herbal medicines.   ALLERGIES:  DARVOCET causes nausea.   SOCIAL HISTORY:  The  patient lives with her 34 year old husband, and they  take care of each other.  They live in [place].  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  She does drive and gets her hair done every Friday.   FAMILY HISTORY:  Noncontributory.   REVIEW OF SYSTEMS:  No melena or hematochezia.  No fever.  No lower  abdominal pain/pelvic pain.  No vaginal bleeding or discharge.  No dysuria.  No rash.  No lower extremity swelling.  No shortness of breath.   PHYSICAL EXAM:  VITAL SIGNS:  Temperature 97.7, pulse 66, respirations 20,  blood pressure 186/73.  Weight 142 pounds.  GENERAL:  Alert and talkative.  She is oriented to person, place, time, and  situation.  HEENT:  Pupils are equal, round, and reactive to light and accommodation.  Extraocular movements are intact.  No icterus.  Tympanic membranes with good  light reflex and landmarks bilaterally.  Oropharynx with slightly tachy  mucosa, pink, no lesion.  NECK:  Supple.  With no lymphadenopathy or thyromegaly.  LUNGS:  Clear to auscultation bilaterally, nonlabored breathing.  CARDIOVASCULAR:  Regular rhythm and rate with a 2/6 systolic murmur heard   best at the left lower sternal border but heard throughout the precordium.  ABDOMEN:  Soft, with mild to moderate tenderness to palpation in the  epigastrium diffusely.  No rebound or guarding.  There is mild right lower  quadrant pain as well.  No mass.  No pulsations.  No hepatosplenomegaly.  There is a faint abdominal bruit versus transmission of the cardiac murmur.  Femoral pulses 2+ bilaterally.  Radial pulses 2+ bilaterally.  EXTREMITIES:  No edema, clubbing, or cyanosis.  NEUROLOGIC:  Cranial nerves II-XII grossly intact bilaterally.  Motor exam  shows 5/5 in proximal and distal muscles in the upper extremities and lower  extremities bilaterally.  No sensory deficits.   LABORATORIES:  EKG:  Normal sinus rhythm, rate 60.  Nonspecific T-wave  changes in the anterolateral leads.  There are no ischemic changes noted.   Abdominal x-ray shows a nonspecific bowel gas pattern.  There is a 1-cm oval  density in the left lower quadrant.  There is no prior abdominal film for  comparison.  Portable chest x-ray shows slightly increased vascular  markings.  No cardiomegaly, no infiltrate, no effusions.   CBC shows a white count of 12.6 with hemoglobin of 13.7 and platelet count  259.  Differential with 91% neutrophils, 7% lymphocytes.  Sodium 135,  potassium 3.8, chloride 101, bicarbonate 24, BUN 24, creatinine 1.5, glucose  163, LDH 275, calcium 9.4, albumin 3.8, total protein 6.8, ALT 68, AST 212,  alkaline phosphatase 87, total bilirubin 1.3, indirect bilirubin 0.9, direct  bilirubin 0.4.  PT 12, INR 0.8, PTT 23.  Lipase 5900, amylase 3676.  CK  total 102, CK-MB 3.2, relative index 3.1, troponin I 0.02.   ASSESSMENT AND PLAN:  1. Pancreatitis:  Clinical presentation suspicious for obstructing gallstone     which she likely now has passed.  I have ordered an abdominal ultrasound    to evaluate for biliary tract disease and will hydrate with IV fluids and     treat pain p.r.n.  Of note, her  pain is controlled now without ever     receiving pain medicine, but only antiemetics.  Will keep her n.p.o.     except for ice chips for now.  2. Hypertension:  The patient is  moderately hypertensive presently and is on     two medications at home for this.  Will continue with ACE inhibitor in     the hospital since we are hydrating her.  Will hold her diuretic for now.  3. Hypothyroidism:  Will continue home medications.  4. Gastroesophageal reflux disease:  Will continue home medications.  5. Will notify Dr. Sallee Lange of admission in the morning.     ___________________________________________                                         Tammi Sou, M.D.   PHM/MEDQ  D:  06/06/2003  T:  06/06/2003  Job:  [id number]

## 2010-11-08 NOTE — Discharge Summary (Signed)
NAME:  Alexandra [name], Alexandra [name]                          ACCOUNT NO.:  [id number]   MEDICAL RECORD NO.:  [id number]                   PATIENT TYPE:  INP   LOCATION:  2031                                 FACILITY:  [facility]   PHYSICIAN:  Gilford Raid, M.D.                  DATE OF BIRTH:  17-Aug-1921   DATE OF ADMISSION:  06/12/2003  DATE OF DISCHARGE:  07/04/2003                                 DISCHARGE SUMMARY   CARDIOLOGIST:  Jacqulyn Ducking, M.D.   ADMISSION DIAGNOSES:  1. Non-Q-wave myocardial infarction.  2. Gallstone pancreatitis.   DISCHARGE DIAGNOSES:  1. Severe three vessel coronary artery disease status post non-Q-wave     myocardial infarction.  2. Gallstone pancreatitis, resolved.  3. Postoperative symptomatic bradycardia secondary to sinus node dysfunction     status post dual chamber pacemaker placement.  4. Postoperative atrial fibrillation/atrial flutter, resolved.   PAST MEDICAL HISTORY:  1. Hypertension.  2. Hypothyroidism.  3. Gastroesophageal reflux disease.  4. History of gastric ulcer in 2003.  5. Left colonic diverticulosis.  6. History of hiatal hernia.  7. History of prior cardiac workup in May 2004 with a negative Adenosine     Cardiolite scan and a normal echocardiogram.  8. History of hysterectomy for fibroid tumors.  9. History of tonsillectomy.   CONSULTATIONS:  [place] Ray. Grandville Silos, M.D. from [facility] to  evaluate cholecystitis.   PROCEDURES:  1. On June 13, 2003 she underwent a cardiac catheterization by Dr. Christy Sartorius. Findings showed severe three vessel coronary artery disease. The     LAD had 50-60% proximal stenosis and 90% mid vessel stenosis. The left     circumflex had a 90% small first marginal and a 50% lesion in the AV     groove prior to a large second marginal. The right coronary artery had     80% osteal stenosis, 70% mid stenosis, and then 60% distal stenosis. The     left ventricular ejection fraction was about 35%  with akinesis of the     distal anterior wall apex and distal inferior wall. She had 3+ mitral     regurgitation with an end diastolic pressure of 35. There was no gradient     across the aortic valve.  2. On June 15, 2003 she underwent a transesophageal echocardiogram and     cardiographic probe placement with interpretation during aortic and     tricuspid valve replacement by Dr. Lillia Abed.  3. On June 15, 2003 she underwent a median sternotomy extracorporal     circulation, coronary artery bypass graft surgery x5 using a left     internal mammary graft to the left anterior descending coronary artery,     saphenous vein graft to the diagonal branch of the LAD, a sequential     saphenous vein graft to the first and second obtuse marginal branches of  the left circumflex coronary artery disease, and saphenous vein graft to     the posterior descending coronary artery. She also had aortic valve     replacement with a 21 mm pericardial tissue valve and tricuspid valve     repair using a 26 mm Annuloplasty ring. Endoscopic vein harvesting was     done using the right leg. Her surgeon was Dr. Gilford Raid.  4. On July 03, 2003 she underwent placement of a dual chamber pacemaker     by Dr. Lovena Le.   BRIEF HISTORY:  Alexandra [name] is a 77 year old Caucasian female who was  initially admitted to [facility] on June 06, 2003 for what was  thought to be gallstone pancreatitis. While there she developed an acute  onset of lower chest and upper abdominal pain after eating breakfast, which  was associated with nausea and vomiting. She also had some diaphoresis and  looked pale. She did take a nitroglycerin tablet, which did not bring any  relief. While in the emergency department she did have a normal  electrocardiogram and cardiac enzymes. Her lipase was elevated at 5900 and  her amylase at 3676. Her troponin was 0.02. Her CPK 102 with an MB of 3.2.  Subsequently she was  admitted with gallstone pancreatitis and an ultrasound  of her abdomen was done, which was consistent with acute cholecystitis.  Shortly after her hospitalization she was noted to have an episode of  hypotension and bradycardia with a feeling of coolness. At that time she was  noted to have an abnormal electrocardiogram. Her CPK was also elevated at  291 with an MB of 18.6 and her troponin was 1.69. Subsequently she was  diagnosed with a non-Q-wave myocardial infarction and was transferred to  [facility] where she was to undergo cardiac catheterization.   HOSPITAL COURSE:  On June 12, 2003 Alexandra [name] was transferred from [facility] to [facility] to undergo a cardiac catheterization.  This was done on June 13, 2003 as described above. Subsequently Dr.  Gilford Raid was consulted for cardiac surgery evaluation. Based on her  cardiac catheterization results Dr. Gilford Raid did feel that Alexandra [name]  would benefit from coronary artery bypass graft surgery as well as aortic  valve replacement for severe aortic insufficiency and tricuspid valve repair  for tricuspid regurgitation. After discussing the risks and benefits with  the patient, her husband, and daughter, the patient agreed to proceed and  this was tentatively scheduled for December 23.   On admission to [facility] Dr. Georganna Skeans was also consulted to  evaluate her acute cholecystitis as described above. At that time she was  started on Unasyn intravenously and continued on a low-fat diet. At the time  of his consultation her gallstone pancreatitis was nearly resolved. Her  nausea and vomiting and abdominal pain were also resolving, so Dr. Grandville Silos  did feel that further evaluation of her cholecystitis could be postponed to  after her cardiac surgery.   On June 15, 2003 Alexandra [name] did undergo coronary artery bypass graft surgery x5, aortic valve replacement, and tricuspid valve repair  as noted  above. Overall she tolerated these procedures very well, but did require  fresh frozen plasma and platelets to correct postoperative coagulopathy. She  also received one unit of packed red blood cells for a decreased hemoglobin  of 5.1 postoperatively.   Over the next couple of days Alexandra [name] did slowly progress. She was  extubated and neurologically intact on postoperative day one. Her  mobilization was slow initially, requiring two people assist as well as a  front wheel walker. She also required supplemental oxygen per nasal cannula,  but was weaned to room air prior to discharge. She also required diuresis  with Lasix and potassium replacement for the first several weeks of her  hospitalization, however, prior to discharge she was actually below her  preoperative weight. In fact, she was noted to have decreased from 155  pounds to 140 pounds. Subsequently her diuretics were held and she was  started on Ensure Plus twice a day for supplemental calories and nutrition.   On the morning of June 22, 2003 she was noted to have a heart rate in  the high 40s. Subsequently she was connected to a temporary pacer and was  atrial paced at a rate of 80. She remained atrial paced until June 28, 2003. At that time Dr. Cyndia Bent did turn off the pacer and she was noted to go  into atrial fibrillation with a rate of 110. At that time she was started on  oral Digoxin and Lovenox. By later that afternoon she had converted back  into normal sinus rhythm at a rate of 62. However, the following day she was  noted to be bradycardic with a rate in the 40s and 50s with runs of atrial  fibrillation and flutter. At this time she was reconnected to that atrial  pacer with a rate of 80. Cardiology reconsulted for a permanent pacemaker  and a dual chamber pacemaker was placed on July 03, 2003 as described  above.   On postoperative day nine she was noted to have shortness of breath and  right  shoulder pain. A D. dimer, lower extremity Dopplers, and chest x-ray  were done to rule out the possibility of pulmonary embolism. Venous studies  of her lower extremities were negative for DVT, SVT, or Baker's cyst. A  chest CT was also done, which showed no evidence of pulmonary embolism. The  following day she also reported acute chest pain and cardiac enzymes were  ordered that showed a slightly elevated troponin at 0.3. She was started on  a nitroglycerin drip and did undergo another echocardiogram on June 27, 2003 which showed a left ventricular ejection fraction ranging between 40  and 50%. No mitral or tricuspid valve regurgitation and aortic valve  replacement was functioning normal. Overall, Dr. Cyndia Bent was pleased with  these results. Her chest pain did resolve the following day.   On postoperative day 56 Alexandra [name] was progressing well. As stated above she did require a permanent pacemaker, which was placed on January 10 and she  was noted on telemetry to be atrial paced at 76 beats per minute. Her vital  signs were stable and she was afebrile. She was saturating 94% on room air.  She did continue to have slight edema and bruising on the right lower  extremity, but overall her lower extremity and sternal incisions were clean,  dry, and intact without erythema. She positively denied any abdominal pain  and her liver enzymes on June 29, 2003 were within normal limits. She did  complete a 12 day course of Unasyn for her cholecystitis. She was also have  bowel movement without diarrhea. She was ambulating in the hallways with a  rolling walker independently, however, she was noted to be stopping several  times for rest. Based on this home health physical therapy was arranged  as  well as home health nursing for further monitoring of her vital signs and  incisions. Based on her ___________ study Dr. Cyndia Bent did feel that she was  stable for discharge on July 04, 2003.    ALLERGIES:  Darvocet.   RECENT LABS:  On May 31, 2003 her sodium was 136, potassium 4.3, blood  glucose 106, BUN 21, creatinine 1.5. On June 29, 2003 her alkaline  phosphatase was 86, SGOT 21, SGPT 10, total protein 6.3, and albumin 2.6.  Her white blood count was 8.8, hemoglobin 10.3, hematocrit 30.1, and  platelet count 253.   MEDICATIONS:  She will resume the following home medications:  1. Synthroid 112 mcg 1 p.o. daily.  2. Prilosec 20 mg 1 p.o. daily.  3. Altace 10 mg 1 p.o. daily.  4. Aspirin 81 mg 1 p.o. daily.  5. Toprol XL 25 mg 1 p.o. daily, which was started during the     hospitalization.  6. She may also take a laxative of choice if needed for constipation.  7. Ultram 50 mg 1-2 tablets p.o. q.4h. p.r.n. pain.   ACTIVITY:  She is to avoid driving, heavy lifting, or strenuous activity.  Home health physical therapy through [facility] is to evaluate and  assist with mobilization and home safety.   DIET:  She is to follow a balanced low-fat diet.   WOUND CARE:  She may shower daily with soap and water. She is to notify the  CVTS office for fever greater than 101, redness, or drainage from any of her  incision sites.   SPECIAL INSTRUCTIONS:  [facility] Nurse to provide routine nursing  care and wound evaluation.   DISCHARGE FOLLOW UP:  1. She is to see Dr. Cyndia Bent at the [place] office on Tuesday, July 18, 2003     at 11:45 a.m. She is instructed to bring her chest x-ray to this     appointment.  2. She is to follow up with Dr. Jacqulyn Ducking at [facility] in two     weeks. She will have a chest x-ray done at this appointment. She is to     call [phone] to schedule this appointment. [facility] is also     notified of her discharge. She will also have a three month follow-up     with [facility] for her  pacemaker placement. 3. She is to follow up with Dr. Georganna Skeans at [facility]     in four to six  weeks. She is to call [phone] to schedule this     appointment.      Jacinta Shoe, P.A.                  Gilford Raid, M.D.    AWZ/MEDQ  D:  07/03/2003  T:  07/04/2003  Job:  [id number]   cc:   Gilford Raid, M.D.  [address]  [place]  [place] [address]   Jacqulyn Ducking, M.D.   Merri Ray Grandville Silos, M.D.  [facility]  [address] [place], [place] [address]  Fax: [phone]

## 2010-11-08 NOTE — Op Note (Signed)
NAME:  Alexandra [name], Alexandra [name]                          ACCOUNT NO.:  [id number]   MEDICAL RECORD NO.:  [id number]                   PATIENT TYPE:  AMB   LOCATION:  DAY                                  FACILITY:  [facility]   PHYSICIAN:  Hildred Laser, M.D.                 DATE OF BIRTH:  1921-10-05   DATE OF PROCEDURE:  04/11/2002  DATE OF DISCHARGE:                                 OPERATIVE REPORT   PROCEDURE:  Esophagogastroduodenoscopy followed by total colonoscopy.   INDICATIONS FOR PROCEDURE:  Alexandra [name] is [name] 77 year old Caucasian female who  was found to have gastric ulcer on recent EGD. She is undergoing repeat exam  to document ulcer healing. She has experienced any dysphagia since the last  EGD and ED. She is also undergoing colonoscopy primarily for screening  purposes. She has had nonbloody diarrhea she feels is secondary to Prilosec  and she was recently switched to Protonix. The procedure risks were reviewed  with the patient and informed consent was obtained.   PREOP MEDICATION:  Cetacaine spray for pharyngeal topical anesthesia,  Demerol 50 mg IV, Versed 4 mg IV in divided dose.   INSTRUMENT:  Olympus video system.   FINDINGS:  Procedures were performed in endoscopy suite. The patient's vital  signs and O2 sat were monitored during the procedure and remained stable.   1. ESOPHAGOGASTRODUODENOSCOPY.  The patient was placed in the left lateral     decubitus position, endoscope was passed through oropharynx without any     difficulty into her esophagus.   ESOPHAGUS.  The mucosa of the esophagus was normal throughout. There was  incomplete noncritical Schatzki's ring. There was [name] small sliding hiatal  hernia.   STOMACH:  Empty and distended very well with insufflation. The folds in the  proximal stomach were normal. There were some focal erythema in the antrum  which was felt to be the site of previous ulceration that is completely  healed. Pyloric channel was patent. The  angularis and fundus were examined  by retroflexing the scope and were normal.   DUODENUM:  Examination of the bulb and second part of the duodenum was  normal. The endoscope was withdrawn and the patient was prepared for  procedure #2.   1. COLONOSCOPY.  Rectal examination performed. This was within normal     limits. The scope was placed through the rectum and advanced under direct     vision in the sigmoid colon and beyond. Preparation was excellent. The     scope was passed to the cecum which was identified by the ileocecal valve     and appendiceal stump. Pictures taken for the record. As the scope was     withdrawn, the mucosa was once again carefully examined. There were [name] few     diverticula which was clustered in the area of sigmoid and descending     colon junction. There  was [name] 5-6 mm polyp at the distal sigmoid colon     which was snared. The rest of the mucosa was normal. The rectal mucosa     was also normal. The scope was retroflexed to examine the anorectal     junction and some small hemorrhoids were noted below the dentate line.     The endoscope was straightened and withdrawn. The patient tolerated the     procedures well.   FINAL DIAGNOSES:  1. Small sliding hiatal hernia with incomplete noncritical Schatzki's ring.  2. Gastric ulcer that has completely healed.  3. Left colonic diverticulosis.  4. [name] 5-6 mm polyp snared from distal sigmoid colon.  5. Small external hemorrhoids.   RECOMMENDATIONS:  1. She will continue antireflux measures and Protonix as before.  2. High fiber diet.  3. Citrucel one tablespoonful daily.  4. Like for her to hold off her ASA for one week.   I will be contacting the patient with biopsy results and further  recommendations.                                                  Hildred Laser, M.D.    NR/MEDQ  D:  04/11/2002  T:  04/11/2002  Job:  [id number]   cc:   Alexandra [name]. Alexandra [name], M.D.

## 2010-11-08 NOTE — Op Note (Signed)
NAME:  Alexandra [name], Alexandra [name]                          ACCOUNT NO.:  [id number]   MEDICAL RECORD NO.:  [id number]                   PATIENT TYPE:  INP   LOCATION:  2031                                 FACILITY:  [facility]   PHYSICIAN:  Champ Mungo. Lovena Le, M.D.               DATE OF BIRTH:  04/10/1922   DATE OF PROCEDURE:  07/03/2003  DATE OF DISCHARGE:                                 OPERATIVE REPORT   PROCEDURE PERFORMED:  Insertion of a dual chamber pacemaker.   INDICATIONS FOR PROCEDURE:  Symptomatic bradycardia in the setting of tachy-  brady syndrome.   INTRODUCTION:  The patient is a 77 year old woman with a history of  aortic  insufficiency, status post  aortic valve replacement. She also has a history  of sinus node dysfunction. Postoperatively she developed symptomatic  bradycardia with sinus rates of less than 40 despite a lack of significant  sinus node blocking drugs as well as paroxysmal atrial fibrillation with  rapid ventricular response with rates of over 130 beats a minute. She is now  referred for permanent pacemaker insertion.   DESCRIPTION OF PROCEDURE:  After informed consent was obtained the patient  was taken to the diagnostic electrophysiology laboratory  in a fasting  state. After the usual prepping and draping a total of 30 mL of Lidocaine  was infiltrated into the left infraclavicular region.   A 6-cm incision was carried out over this region and electrocautery was  utilized to dissect down to the fascial plane. Then 10 mL of contrast was  injected into the left upper extremity venous system and demonstrated a  patent left subclavian vein. It was subsequently punctured x2, and the  Medtronic model 5076, 52-cm active fixation pacing lead, serial number  [id number] was advanced into the right ventricle and the Medtronic model  5076, 45-cm active fixation pacing lead, serial number [id number] was  advanced into the right atrium. Mapping was carried out in the right  ventricle, and at the final site the R-waves measured 11 millivolts in the  pacing threshold with the leads actively fixed with 0.3 volts at 0.5  milliseconds with a pacing impendence of 690 ohms. Then 10 volt pacing was  carried out and did not stimulate the diaphragm.   With the ventricular lead in satisfactory position, attention was turned to  the atrial lead. Mapping was carried out in the right atrium where P-waves  measured 1.4 millivolts paced, and the atrial threshold was 0.7 volts at 0.5  milliseconds. Once the lead was actively fixed, the pacing impendence was  509 ohms. Again 10 volt pacing did not stimulate the diaphragm.   With both atrial and ventricular leads in satisfactory position, they were  secured to the subpectoralis fascia with a figure-of-8 silk suture. In  addition the sewing sleeves ere secured with silk suture.   Electrocautery was then utilized to make a subcutaneous pocket. Kanamycin  irrigation was  utilized to irrigate the pocket and electrocautery was  utilized to assure hemostasis. The Medtronic Kappa KDR 901 dual chamber  pacemaker serial number [id number] was connected to the atrial and  ventricular pacing leads and placed in the subcutaneous pocket. The  generator was secured with a silk suture.   Additional kanamycin was utilized to irrigate the incision and the incision  was then closed with a layer of 2-0 Vicryl followed  by a layer of 3-0  Vicryl followed  by a layer of 4-0 Vicryl. Benzoin was painted on the skin,  Steri-Strips were applied and a pressure dressing  was placed. The patient  was returned to her room in satisfactory condition.   COMPLICATIONS:  There were no immediate procedure complications.   RESULTS:  This demonstrates successful  implantation of a Medtronic dual  chamber pacemaker in a patient with symptomatic bradycardia following  aortic valve replacement.                                               Champ Mungo. Lovena Le,  M.D.    GWT/MEDQ  D:  07/03/2003  T:  07/03/2003  Job:  [id number]   cc:   Nicki Reaper A. Wolfgang Phoenix, M.D.  [address] [place] [address]  Fax: [phone]   Scarlett Presto, M.D.  Fax: [phone]   Wynona Dove, R.N. [facility]

## 2010-11-08 NOTE — Group Therapy Note (Signed)
NAME:  Alexandra [name], Alexandra [name]                ACCOUNT NO.:  [id number]   MEDICAL RECORD NO.:  [id number]          PATIENT TYPE:  INP   LOCATION:  A339                          FACILITY:  [facility]   PHYSICIAN:  Scott A. Wolfgang Phoenix, MD    DATE OF BIRTH:  [birth year]   DATE OF PROCEDURE:  DATE OF DISCHARGE:                                   PROGRESS NOTE   Overall patient doing good.  Has a brace on the left arm.  Awaiting a brace  for the left knee.  Dr. Aline Brochure saw the patient.  Going to have physical  therapy to see her today.  It is hard to note when the patient will be able  to go home.  Will talk to Dr. Aline Brochure and try to help clarify that.  I  think as soon as it is cleared by him the patient can go home because  medically everything seems stable.  The patient is alert and appropriate  this morning and eating okay.      Scott A. Wolfgang Phoenix, MD  Electronically Signed     SAL/MEDQ  D:  08/15/2005  T:  08/15/2005  Job:  [id number]

## 2010-11-08 NOTE — H&P (Signed)
NAME:  Alexandra [name], Alexandra [name]                          ACCOUNT NO.:  [id number]   MEDICAL RECORD NO.:  [id number]                   PATIENT TYPE:  INP   LOCATION:  A220                                 FACILITY:  [facility]   PHYSICIAN:  Naomie Dean, M.D.                  DATE OF BIRTH:  [birth year]   DATE OF ADMISSION:  10/23/2002  DATE OF DISCHARGE:                                HISTORY & PHYSICAL   PRIMARY CARE PHYSICIAN:  Scott A. Wolfgang Phoenix, M.D.   CHIEF COMPLAINT:  I have chest pain.   HISTORY OF PRESENT ILLNESS:  The patient is a 77 year old lady with a  history of hypothyroidism and hypertension.  She was well until about 2  hours ago when she woke up with substernal chest pain which she describes as  a dull ache 10/10 radiating to the back.  She took four tablets of aspirin  and came to the emergency room.  The pain subsided in the emergency room  after she was given a GI cocktail but it was completely relieved after she  was given some nitroglycerin sublingually.  She denied any nausea,  diaphoresis, palpitations, or any shortness of breath.  She has no more  chest pain at the moment.   REVIEW OF SYSTEMS:  CONSTITUTIONAL: She denies any fever or recent weight  loss.  RESPIRATORY: She denies any cough or shortness of breath.  GI: Denies  any nausea, vomiting, or diarrhea.  GENITOURINARY: Denies any dysuria.  All  other systems were reviewed and were negative.   PAST MEDICAL HISTORY:  She has hypertension.  She has hyperlipidemia,  hypothyroidism, and anxiety disorder.   MEDICATIONS:  Her medications include chlorthalidone 50 mg once daily and  Synthroid 112 mcg once daily, Crestor 10 mg once daily, Adalat XL 50 mg and  Xanax p.r.n.   ALLERGIES:  She has no known drug allergies.   FAMILY HISTORY:  Her father died from a heart attack at the age of 65.  Her  mother also died from a CVA.   SOCIAL HISTORY:  She is married with three children.  She quit smoking 20  years ago; does not  drink alcohol.  She lives at home with her husband, and  she is independent of activities of daily living.   PHYSICAL EXAMINATION:  VITAL SIGNS:  Blood pressure is 178/56, heart rate is  53, respiratory rate is 18.  GENERAL:  She is an elderly lady lying comfortably on a stretcher, not in  any apparent distress.  HEAD/EARS/NOSE AND THROAT:  She is not pale.  She is anicteric.  Oral mucosa  is moist.  NECK:  Supple.  No jugular venous distention.  No thyromegaly.  No carotid  bruit.  CHEST:  Air entry is good bilaterally.  Breath sounds are vesicular.  No  wheeze or crackles are heard.  CVS:  Heart sounds 1 and 2 are  normal rhythm, appears regular, but slightly  bradycardic.  No murmurs are appreciated.  ABDOMEN:  Benign.  CNS:  She is awake, alert and oriented x 3.  She has no gross or focal  neurological deficits.  EXTREMITIES:  She has no pedal edema.  Pulses are 2+ bilaterally.  No  clubbing or cyanosis is noted.   LABORATORIES AND DIAGNOSTICS:  WBC is 5.3, neutrophil is 68%, hemoglobin is  12.0, MCV is 92.9, platelet count is 189.  Sodium is 134, potassium is 3.2,  chloride is 103, CO2 is 26, BUN is 19, creatinine is 1.1, glucose is 119,  calcium is 8.2.  PTT is 28, PT is 12.8, INR is 0.9.  CT is 104, troponin is  0.01.  MB fraction: CK-MB is 2.4.  Her chest x-ray shows no cardiomegaly, no  infiltrates, no widening of the mediastinum.  Her EKGs are sinus rhythm at  59 beats per minute, left axis deviation, normal intervals, no acute ST or T  wave changes.   ASSESSMENT AND PLAN:  1. Chest pain in a medically solid lady with hypertension and     hyperlipidemia.  The patient will be admitted to telemetry to rule out     acute myocardial infarction.  I will order serial cardiac enzymes and EKG     q.6h.  The patient will be put on aspirin 325 once daily.  Her beta     blockers will be held because of the patient's slow heart rate and she     will also be given nitroglycerin as  needed and a cardiology evaluation     may be needed on this admission.  2. Gastroesophageal reflux disease.  The patient will be given Protonix 40     mg once daily.  3. Hypertension, uncontrolled at this time.  The patient will be resumed on     Hygroton 50 once daily and Adalat 30 once daily.  Her blood pressure will     be monitored and medication should be adjusted accordingly.  4. Hyperlipidemia.  The patient's lipid panel will be checked.  She will be     continued on Crestor 10 mg once daily.  5. Hypothyroidism.  The patient's TSH will be checked and maintained on     Synthroid 112 mcg once daily.  6. Hypokalemia.  The patient will be put on oral replacement K-Dur 40 mg     stat and a BMET to be monitored.   The patient to be admitted under the care of primary MD, Dr. Wolfgang Phoenix.  Further workup and management depend on clinical course.                                               Naomie Dean, M.D.    DW/MEDQ  D:  10/23/2002  T:  10/23/2002  Job:  [id number]

## 2010-11-08 NOTE — H&P (Signed)
NAME:  Alexandra [name], Alexandra [name]                          ACCOUNT NO.:  [id number]   MEDICAL RECORD NO.:  [id number]                   PATIENT TYPE:  INP   LOCATION:  1831                                 FACILITY:  [facility]   PHYSICIAN:  Fritz Pickerel [name]. Alexandra [name], M.D. [facility]            DATE OF BIRTH:  1922/02/21   DATE OF ADMISSION:  07/09/2003  DATE OF DISCHARGE:                                HISTORY & PHYSICAL   ADMITTING PHYSICIAN:  Norva Pavlov, MD.   This is [facility] Admission Note.   CHIEF COMPLAINT:  Left chest pressure.   HISTORY OF PRESENT ILLNESS:  Patient is [name] 77 year old female who was  admitted to [facility] on June 12, 2003, with non-ST elevation  MI with peak MB of 17 and [name] troponin-I of 1.7.  The following day she had  cardiac catheterization showing three-vessel disease with 60% proximal and  90% mid LAD lesions, 50% mid left circumflex lesion, 90% OM-1 lesion, 80%  ostial RCA lesion, left ventricular ejection fraction of 35% with anterior  apical akinesis, 3+ mitral regurgitation and evidence of peripheral vascular  disease.  Consequently, on June 15, 2003, the patient underwent  cardiothoracic surgery with five-vessel bypass grafting including LIMA to  LAD, SVG to D1, SVG to OM1/OM2, and SVG to PDA, as well as interop  transesophageal echocardiography showing 3+ aortic regurgitation and  tricuspid regurgitation, prompting placement of aortic valve, 21 mm  pericardial valve as well as [name] tricuspid valve repair with [name] ring.  At  admission, the patient also had gallstone pancreatitis and cholecystitis.  She was treated with Unasyn for 14 days.  Postoperatively the patient  recovered well until June 22, 2003, when she had bradycardia into the  40s.  [name] temporary pacemaker was placed and she was apaced at [name] rate of 80.  She continued to do well until June 28, 2003, when she had postoperative  atrial fibrillation at [name] rate of 110.  This was followed again by  bradycardia.  Ultimately, [name] DDD permanent pacemaker was placed on July 03, 2003.  She was discharged home in stable condition on July 04, 2003.   The patient did well until 1 [name].m. on July 09, 2003, when she was awoken  from sleep by 10/10 chest pain in the left shoulder at the pacemaker pocket  site, with radiation down the left arm, pain sharp in quality and much  different than her prior anginal symptoms.  She had no swelling or redness  at the pacemaker site, no fevers or chills.  No trauma or strain to the left  arm.  No shortness of breath or palpitations or dizziness.  She took one  Ultram without effect and called the Cardiology coverage.  She had been  given antibiotics up til the day of her discharge but has not been on  antibiotics the last few days.  Because of her extensive cardiac history  in  the recent months, she was instructed to report to [facility] for  further evaluation.   PAST MEDICAL HISTORY:  1. Coronary artery disease status post five-vessel bypass grafting.  2. Ischemic cardiomyopathy with an EF of approximately 45% postoperatively.  3. Bioprosthetic aortic valve replacement.  4. Tricuspid valve repair.  5. Sick sinus syndrome/postoperative atrial fibrillation status post     pacemaker placement.  6. Hypertension.  7. Gallstone pancreatitis and cholecystitis.  8. Hypothyroidism.  9. Peptic ulcer disease in 2003.  10.      Diverticulosis.  11.      Hiatal hernia.  12.      Total abdominal hysterectomy for fibroids.  13.      Tonsillectomy.  14.      Schatzki's ring status post dilation.  15.      Chronic anxiety.   ALLERGIES:  DARVOCET.   MEDICATIONS:  1. Synthroid 112 mcg daily.  2. Prilosec 20 mg daily.  3. Ramipril 10 mg daily.  4. Aspirin 81 mg daily.  5. Toprol XL 20 mg daily.  6. Ultram as needed.  7. Laxative as needed.   SOCIAL HISTORY:  The patient lives in [place] in [name] trailer with her  husband.  She has two  daughters and [name] son nearby.  She has [name] distant smoking  history and quit in 1970.  No alcohol or drug or herbal medicine use.   FAMILY HISTORY:  Her father had [name] myocardial infarction.   REVIEW OF SYSTEMS:  Chest pain and arm pain.  Otherwise negative in detail.   ADVANCED DIRECTIVE:  Patient is Full Code.   PHYSICAL EXAMINATION:  Pulse 75, blood pressure 115/62, respirations 22,  temperature 97.9.  GENERAL:  The patient was comfortable appearing, nontoxic and conversant.  NECK:  Had no carotid bruits with small thyroid and visible jugular venous  pressure at 7 cm.  CHEST:  Her left chest wall showed [name] pacemaker incision site that was clean  with Steri-Strips over it and no drainage.  No swelling at the site and only  mild tenderness.  She had mild warmth but no significant erythema.  Her  sternal incision site was healing well.  She had no axillary or cervical  lymphadenopathy around the pacemaker site.  HEART:  Was regular rate and rhythm with normal S1, S2, no gallops and [name] 3/6  mitral regurgitation murmur at the apex as well as [name] 1/6 aortic murmur at  the base.  Pulses were 2+ in the [place] and 1+ in the __________.  She had no  femoral bruits.  LUNGS:  Clear to auscultation bilaterally.  SKIN:  Without rash.  ABDOMEN:  Soft, nontender.  RECTAL EXAM:  Showed normal sphincter tone with no masses, minimal stool and  visible hemorrhoid with heme negative stool.  EXTREMITIES:  Showed minimal edema and were warm with [name] well-healing  saphenous vein harvest site on the right leg.  Patient was alert and  oriented.   Chest x-ray showed atrial and ventricular leads in place without fracture  and no evidence of abnormality around the pacemaker generator box, no  significant pulmonary edema, no infiltrate, and mild cardiomegaly with  questionable small effusion on the left.   EKG showed [name] rate of 77, in normal sinus rhythm with [name] leftward axis at -20, normal intervals and anterolateral  inferior T-wave inversions.  Her inferior  T-wave inversions were significantly increased from July 04, 2003, and  there was mild pseudonormalization of her lateral  T-waves.   LABORATORIES:  With white blood cell count of 7.0 and [name] dif of 70%  neutrophils, 17 lymphocytes and 9 monocytes.  Hematocrit 32.4, platelets  189, sodium 134, potassium 3.5, BUN 16, creatinine 1.2, down from 1.5.  Glucose 119, calcium 8.5, CK 34, troponin-I 0.09, PT 13.6, PTT 30.  Two  blood cultures were sent and pending.   IMPRESSION:  77 year-old female with extensive recent  cardiovascular history including five-vessel coronary artery bypass graft,  aortic valve replacement, tricuspid valve repair and pacemaker placement for  sick sinus.  By history, the patient reports pain at the pacemaker site and  this most likely represents symptoms from the pacemaker pocket itself.  Concern would be for infection of the site or hematoma but examination,  white blood cell count and temperature argue against infection.  However,  her mild EKG changes and mildly elevated troponin point towards unstable  angina/non ST elevation myocardial infarction syndrome.  Her CK is  relatively flat and her troponin-I could be residual from the cardiac events  of her recent hospitalization.  Consequently, we will admit her for  observation and further evaluation of possible cardiac ischemia.   PROBLEM LIST:  1. Chest pain.  Admit to the Primary Care Unit.  Continue aspirin.  Add     Plavix given low chance of surgery.  Unfractionated heparin until cardiac     markers have been cycled times three and repeat EKG is done.  Prior to     discharge the patient may need either [name] Cardiolite or [name] re-     catheterization to evaluate her coronary blood flow.  2. Hyperlipidemia.  The patient was not discharged on [name] statin and this     should be started prior to discharge.  3. Valvular disease.  Postoperative echocardiogram showed  excellent aortic     valve function.  She has no evidence of heart failure currently on exam.  4. Hypothyroidism.  Continue Synthroid.                                                Larina Earthly Alexandra [name], M.D. [facility]    LAA/MEDQ  D:  07/09/2003  T:  07/09/2003  Job:  [id number]

## 2010-11-08 NOTE — Op Note (Signed)
[facility]  Patient:    Alexandra [name], Alexandra [name] Visit Number: [id number] MRN: [id number]          Service Type: END Location: DAY Attending Physician:  Rogene Houston Dictated by:   Hildred Laser, M.D. Proc. Date: 01/11/02 Admit Date:  01/11/2002 Discharge Date: 01/11/2002   CC:         Sallee Lange, M.D.   Operative Report  PROCEDURE:  Esophagogastroduodenoscopy with esophageal dilatation.  INDICATIONS:  Alexandra [name] is a 78 year old Caucasian female who has had intermittent solid food dysphagia for the past few months but now having episodes of food impaction.  She is, therefore, undergoing EGD and ED. She does complain of hot burning and belching with certain foods.  She was begun on the Prevacid by Dr. Sallee Lange yesterday but had not started it yet. Procedure risks were reviewed with the patient, and informed consent was obtained.  PREOPERATIVE MEDICATIONS:  Cetacaine spray for pharyngeal topical anesthesia, Demerol 40 mg IV, Versed 2.5 mg IV.  INSTRUMENT: Olympus video system.  FINDINGS:  Procedure performed in endoscopy suite.  The patients vital signs and OT sat were monitored and during the procedure remained stable. The patienT was placed in the left lateral recumbent position, and the endoscope was passed without any difficulty into the esophagus.  ESOPHAGUS:  The esophagus was normal.  The body was somewhat tortuous.  She had a tight Schatzkis ring.  This was initially dilated by passing it through the scope. There was a small sliding hiatal hernia.  STOMACH:  It was empty and distended very well with insufflation.  Folds in the proximal stomach were normal.  Examination of the mucosa revealed a 5 mm ulcer at the gastric antrum along the posterior wall towards the lesser curvature.  Multiple erosions were noted, one of which was over 1 cm long. The pyloric channel was patent.  The angularis and fundus were examined by retroflexing the  scope.  DUODENUM:  Examination of the bulb and second part of the duodenum was normal. The endoscope was withdrawn.  Esophageal dilatation was performed by passing 75 French Maloney dilator through the esophagus completely.  As the dilatation was completed, the endoscope was passed again, and this ring was noted to be further disrupted with a Maloney dilator.  Pictures were taken for the record and part of our data base.  The endoscope was withdrawn. The patient tolerated the procedure well.  FINAL DIAGNOSES: 1. Tight Schatzkis ring which was initially dilated with a scope and    subsequently bypassing 54 [place] Maloney dilator. 2. Small sliding hiatal hernia. 3. Gastric ulcer at antrum along with multiple erosions.  RECOMMENDATIONS: 1. Antireflux measures. 2. She should resume her Prevacid 30 mg p.o. q. a.m. 3. She is advised to hold off her ASA for now. 4. H. pylori will be checked today, and I will be contacting the patient    with the results within the next few days. Dictated by:   Hildred Laser, M.D. Attending Physician:  Rogene Houston DD:  01/11/02 TD:  01/14/02 Job: [id number]

## 2010-11-08 NOTE — Procedures (Signed)
NAME:  Alexandra [name], Alexandra [name]                ACCOUNT NO.:  [id number]   MEDICAL RECORD NO.:  [id number]          PATIENT TYPE:  INP   LOCATION:  A339                          FACILITY:  [facility]   PHYSICIAN:  Edward L. Luan Pulling, M.D.DATE OF BIRTH:  [birth year]   DATE OF PROCEDURE:  DATE OF DISCHARGE:                                EKG INTERPRETATION   I believe this EKG shows a dual-chamber pacemaker.      Edward L. Luan Pulling, M.D.  Electronically Signed     ELH/MEDQ  D:  08/14/2005  T:  08/15/2005  Job:  [id number]

## 2010-11-08 NOTE — Consult Note (Signed)
NAME:  Alexandra [name], Alexandra [name]                          ACCOUNT NO.:  [id number]   MEDICAL RECORD NO.:  [id number]                   PATIENT TYPE:  INP   LOCATION:  A203                                 FACILITY:  [facility]   PHYSICIAN:  Vernon Prey. Tamala Julian, M.D.                DATE OF BIRTH:  1922/03/08   DATE OF CONSULTATION:  02/12/2004  DATE OF DISCHARGE:                                   CONSULTATION   Scheduled for surgery on February 13, 2004.   An 77 year old female with known history of gallbladder disease dating back  to December 2004.  The patient presents with less than a 24-hour history of  recurrent, severe abdominal pain radiating through to her back with  associated nausea and vomiting.  She states that her pain started in the  midday while she was at church.  She developed some nausea.  The pain became  much more severe, and after leaving church she had recurrent episodes of  nausea with vomiting.  After vomiting over a period of about 3-hours she was  brought to the emergency room for evaluation.   At the time of evaluation she was in severe discomfort.  Vital signs were  stable.  Abdominal exam revealed moderate epigastric and right upper  quadrant tenderness.  She also had some left upper quadrant tenderness with  guarding.  White count was elevated at 12,000.  Her amylase of 8610; follow  up lipase was over 2600.  The patient is admitted with acute biliary  pancreatitis. She will be followed and we will have to get cardiac  clearance, but she should have cholecystectomy within the next 24-48 hours.   PAST HISTORY:  The patient presented with a similar picture on June 07, 2003.  She was noted to have an elevated amylase of 3600 with lipase of 5900  at that time.  Liver functions were normal.  She was being prepared for  cholecystectomy when she developed acute chest pain with weakness and  hypotension and suffered a non-Q wave myocardial infarction.  Previous  adenosine stress  Cardiolite study which was done in May of 2004 was  reportedly normal.  The patient, however, was transferred to [facility] where she underwent urgent cardiac catheterization.  At the time of  cardiac catheterization she had severe 3-vessel disease.  She also had  significant valvular heart disease.  After stabilization she underwent 5-  vessel coronary artery bypass graft.  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  She subsequently  had a dual chamber pacemaker placed. During her open heart surgery she had  an aortic valve replacement and a tricuspid valve repair.  She has been  asymptomatic from her cardiac standpoint.  After she recuperated from her  surgeries she has not had any more abdominal pain since that time either.  She was scheduled to have  elective cholecystectomy in October,   Other medical problems include:  Hypertension, hypothyroidism,  gastroesophageal reflux disease, diverticulosis.   SURGERY:  Hysterectomy, tonsillectomy, 5-vessel coronary artery bypass  graft, aortic valve replacement, tricuspid valve repair. She also had a dual  chamber cardiac pacemaker placement.   MEDICATIONS AT THE TIME OF ADMISSION:  1. Ultram 50 mg p.r.n.  2. Hygroton 25 mg daily.  3. Synthroid 112 mcg daily.  4. Altace 20 mg daily,  5. Prilosec 20 mg daily.  6. Toprol XL 50 mg daily.  7. Lipitor 20 mg h.s.  8. Aspirin 81 mg daily.   ALLERGIES:  She has an allergy to DARVON COMPOUND.   PHYSICAL EXAMINATION:  GENERAL:  On examination she is a pleasant, elderly,  female who is in no acute distress.  VITAL SIGNS:  Blood pressure presently is 137/68; pulse 81; respirations 20,  temperature 97.4,  HEENT:  Unremarkable.  NECK:  Supple.  No JVD, bruits or adenopathy.  CHEST:  Clear to auscultation.  ABDOMEN:  Obese, soft, no epigastric or upper quadrant tenderness, no  masses, no guarding.  Active bowel sounds.  EXTREMITIES:   No clubbing, cyanosis, or edema.  Good peripheral pulses.  No  joint deformity.  NEUROLOGIC EXAM:  No focal, motor, sensory, or cerebellar deficit.   IMPRESSION:  1. Recurrent biliary pancreatitis.  2. Acute cholecystitis.  3. Severe 3-vessel coronary artery disease status post 5-vessel coronary     artery bypass graft.  4. Valvular heart disease status post aortic valve replacement and tricuspid     valve repair.  5. History of sick sinus syndrome status post dual chamber pacemaker     placement.  6. History of atrial fibrillation presently on beta blocker therapy.  7. Hypertension, controlled.  8. Gastroesophageal reflux disease.  9. Hypothyroidism.   PLAN:  The patient will be continued on NSAIDs.  Will follow her amylase and  lipase.  If she is trending downwards we will make plans for early  intervention with cholecystectomy with intraoperative cholangiogram. We will  get cardiology clearance prior to surgery.  With her having bivessel bypass  aortic valve replacement, tricuspid valve repair, and pacemaker placement  she should do very well.  We will need the blessings of cardiology to  proceed.  The procedure has been discussed with the patient, her husband and  daughter.  They understand that she is at risk for perioperative events  including cardiac and cerebrovascular events.  They accept the risk and we  will probably proceed with surgery in the morning.      ___________________________________________                                            Vernon Prey. Tamala Julian, M.D.   LCS/MEDQ  D:  02/12/2004  T:  02/12/2004  Job:  [id number]

## 2010-11-08 NOTE — Consult Note (Signed)
NAME:  Alexandra [name], Alexandra [name]                          ACCOUNT NO.:  [id number]   MEDICAL RECORD NO.:  [id number]                   PATIENT TYPE:  INP   LOCATION:  IC08                                 FACILITY:  [facility]   PHYSICIAN:  Scarlett Presto, M.D.                DATE OF BIRTH:  Nov 01, 1921   DATE OF CONSULTATION:  DATE OF DISCHARGE:                                   CONSULTATION   CONSULTING PHYSICIAN:  Scarlett Presto, M.D.   PRIMARY CARE Kerstie Agent:  Nicki Reaper A. Wolfgang Phoenix, M.D.   HISTORY OF PRESENT ILLNESS:  Ms. Chihuahua is a 77 year old woman with no known  coronary artery disease, who was admitted on the June 06, 2003,  complaining of acute onset of epigastric and chest discomfort, was felt to  have gallstone pancreatitis with an elevated amylase and lipase and was  admitted to the hospital, evaluated.  At that time an abdominal ultrasound  showed pericholecystic fluid and evidence of a mildly dilated gallbladder  and bile ducts, and she was scheduled for surgery tomorrow for a  cholecystectomy with common bile duct exploration.  However, she had acute  onset this morning of lower back pain associated with shortness of breath.  She was evaluated by her primary care Juwuan Sedita, who got an electrocardiogram  which showed junctional bradycardia at a rate of 54 with inverted T waves  across the anterior lateral and inferolateral precordium, and we were  consulted.  She describes the onset of this discomfort in her chest and back  associated with shortness of breath this morning which was progressive.  It  was associated with diaphoresis and mild nausea.  She subsequently resolved  the discomfort and by the time I saw her, she was completely pain free  though mildly short of breath.  Electrocardiogram subsequently showed sinus  rhythm at a rate of 65 with evidence of inverted T waves across the  anterolateral precordium.  A quick-look echocardiogram shows an anterior  wall which is significantly  hypokinetic from the mid ventricle to the apex.  There is a very small pericardial effusion with normal right ventricular  systolic function, normal right ventricular size.   PAST MEDICAL HISTORY:  Significant for:  1. Hypertension which is reasonably well controlled.  2. Hyperlipidemia.  3. A recent evaluation at [facility] for atypical chest pain in     which she had a perfusion study which showed normal left ventricular     systolic function and normal ventricular perfusion.  She has subsequently     had an echocardiogram which showed an ejection fraction of 72% with mild     aortic insufficiency.  4. She has a history of a Schatzki's ring, status post dilatation in July     2003.  5. History of peptic ulcer disease.  6. History of hypothyroidism on replacement.  7. Chronic anxiety.   MEDICATIONS:  Upon presentation were:  1.  Chlorthalidone 50 mg a day.  2. Synthroid 112 mcg a day.  3. Crestor 10 mg a day.  4. Adalat XR 30 mg a day.  5. Xanax as needed.  6. Prilosec 20 mg a day.   ALLERGIES:  She is not allergic to any medications.  Is supposedly  intolerant to beta-blockers due to bradycardia.   SOCIAL HISTORY:  She lives with her husband in [place].  She quit smoking  over 20 years ago.  She does not drink any alcohol.   FAMILY HISTORY:  Noncontributory at this point.   PHYSICAL EXAMINATION:  GENERAL:  She is a well-developed, well-nourished,  white female who is in only mild distress secondary to mild anxiety.  VITAL SIGNS:  Her heart rate is 52 in sinus.  Her blood pressure is 140/70.  Her respiratory rate is 14-16.  NECK:  Without significant jugular venous distention.  There are no carotid  bruits noted.  CARDIAC:  Bradycardic rhythm with a soft diastolic murmur.  Her point of  maximum impulse is not displaced.  She has no S4 or S3.  First and second  heart sounds appear normal.  CHEST:  Shows decreased breath sounds at the bases with frank rales in  the  right base.  ABDOMEN:  Generally tender without significant tenderness to deep palpation.  She has normoactive bowel sounds.  EXTREMITIES:  Her lower extremities are without significant edema.  She has  1-2 pulses throughout.   LABORATORIES:  Her blood gas shows a pH of 7.26 with a pCO2 of 39, a pO2 of  55, 94% saturation and this was on room air with a bicarb of 17.  Corresponding Chem-7 shows a sodium of 124, potassium of 5.0, chloride of  97, bicarb of 20, with a glucose of 149, BUN of 17, creatinine of 1.4.  Her  calcium is 8.1.  Her magnesium is 1.9.  Her amylase is 89.  Her lipase is  42.  Cardiac enzymes, CK of 291 with an MB fraction of 18.6 giving a  relative index of 6.4 with a troponin of 1.69.  Her admission cardiac  enzymes:  CK of 102, MB of 3.2 with a relative index of 3.1 and a troponin  of 0.02.   Her electrocardiogram is previously described.   Her portable chest x-ray shows mild cardiomegaly with increased interstitial  markings consistent with mild congestive heart failure.   RECOMMENDATIONS:  So, my recommendations for this woman are that we  anticoagulate her, treat her as though she has developed an acute anterior  wall myocardial infarction.  The only thing that is concerning is she does  not have any significant chest discomfort, and I wonder if she has completed  her infarction and that her back pain was actually her angina equivalent.  We will continue to cycle her enzymes.  We will transfer her emergently to  [facility], where she can get multi-specialty care including a GI  consultation, general surgery consultation and cardiac evaluation.  She may  need a heart catheterization urgently rather than emergently as she is not  having any discomfort in her chest.  A full formal echocardiogram is  recommended to delineate the extent of her left ventricular systolic dysfunction.  I estimate that her ejection fraction is probably 40% at this   point.      ___________________________________________  Scarlett Presto, M.D.   JH/MEDQ  D:  06/09/2003  T:  06/10/2003  Job:  [id number]

## 2010-11-08 NOTE — Consult Note (Signed)
NAME:  Alexandra [name]                          ACCOUNT NO.:  [id number]   MEDICAL RECORD NO.:  [id number]                   PATIENT TYPE:  INP   LOCATION:  6533                                 FACILITY:  [place]   PHYSICIAN:  Alexandra [name], M.D.                  DATE OF BIRTH:  [birth year]   DATE OF CONSULTATION:  06/13/2003  DATE OF DISCHARGE:                                   CONSULTATION   REFERRING PHYSICIAN:  Christy [name], M.D.   REASON FOR CONSULTATION:  Severe three-vessel coronary artery disease status  post non-Q-wave myocardial infarction.   HISTORY OF PRESENT ILLNESS:  This patient is a 77 year old white female who  was admitted to [facility] on June 06, 2003 with what was felt  to be gallstone pancreatitis. She developed acute onset of lower chest and  upper abdominal pain after eating breakfast which was associated with nausea  and vomiting. She did take nitroglycerin tablet which did not bring any  relief. She had some diaphoresis and turned pale. In the emergency  department, she was evaluated and had normal electrocardiogram and cardiac  enzymes. Her lipase was elevated at 5,900 and her amylase at 3,676. Troponin  was 0.02, and her CPK was 102 with a MB of 3.2. She was admitted with a  diagnosis of gallstone pancreatitis and ultrasound of the abdomen was  consistent with acute cholecystitis. The patient subsequently had an episode  of hypotension and bradycardia with feeling pale and cold and was noted to  have an abnormal electrocardiogram. She had an elevation of her CPK of 291  with a MB of 18.6 and a troponin of 1.69. She ruled in for a non-Q-wave  myocardial infarction. She is transferred to [facility] and underwent  cardiac catheterization today which showed three-vessel coronary disease.  The LAD had 35 to 60% proximal stenosis and 90% mid vessel stenosis. The  left circumflex had 90% small first marginal and a 50% lesion in the AV  groove prior to a  large second marginal. The right coronary artery had 80%  ostial stenosis, a 70% mid stenosis, and then a 60% distal stenosis. Left  ventricular ejection fraction was about 35% with akinesis of the distal  anterior wall, apex, and distal inferior wall. She had 3+ mitral  regurgitation with an end-diastolic pressure of 35. There was no gradient  across the aortic valve.   PAST MEDICAL HISTORY:  1. Hypertension.  2. She has a history of hypothyroidism.  3. She has a history of gastroesophageal reflux disease.  4. She has a history of gastric ulcer in 2003.  5. She has a history of left colonic diverticulosis.  6. She has a history of hiatal hernia.  7. She has a new history of gallstone pancreatitis.  8. She has a history of prior cardiac workup in May of 2004 with negative     adenosine  Cardiolite scan and a normal echocardiogram.  9. She is status post hysterectomy for fibroids.  10.      She is status post tonsillectomy.   MEDICATIONS PRIOR TO ADMISSION:  1. Altace 10 mg q.d.  2. Chlorthalidone 25 mg q.d.  3. Adalat 30 mg q.d.  4. Synthroid 112 mcg q.d.  5. Nitroglycerin sublingual p.r.n.  6. Prilosec 20 mg q.d.  7. Aspirin 81 mg q.d.  8. Occasional Tylenol for back pain.   ALLERGIES:  DARVOCET which causes nausea.   REVIEW OF SYSTEMS:  GENERAL:  She denies fever or chills. She has had no  recent weight changes. She has had some fatigue. EYES:  Negative. ENT:  Negative. ENDOCRINE:  She does have hypothyroidism. She denies diabetes.  CARDIOVASCULAR:  She has had some mild chest pressure. She reports pain in  her back but says this is a lower back pain. She has had exertional  shortness of breath with minimal activity. This has been worsening over the  past few months. She denies PND or orthopnea. RESPIRATORY:  She denies cough  or sputum production. GASTROINTESTINAL:  She has had nausea and vomiting  which prompted her admission. She denies melena or bright red blood per   rectum. GENITOURINARY:  She denies dysuria or hematuria. NEUROLOGICAL:  She  has had no focal weakness or numbness. She denies dizziness or syncope.  MUSCULOSKELETAL:  She does have osteoporosis and has had some lower back  pain. PSYCHIATRIC:  Negative.   SOCIAL HISTORY:  She lives with her 23 year old husband in [place]. 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 She does drive and remains  relatively active.   FAMILY HISTORY:  Negative for coronary artery disease.   PHYSICAL EXAMINATION:  GENERAL:  Her blood pressure is 125/70 and her pulse  is 75 and regular. Respiratory rate is 16 and unlabored. She is a well-  developed elderly white female in no distress.  HEENT:  Shows her to normocephalic, atraumatic. Pupils are equal, round, and  reactive to light and accommodation. Extraocular muscles are intact. Throat  is clear.  NECK:  Shows normal carotid pulses bilaterally. There are no bruits. There  was no adenopathy or thyromegaly.  CARDIAC:  Shows a regular rate and rhythm with a grade 2/6 systolic murmur  along the left sternal border. There is no rub or gallop.  LUNGS:  Reveals slight crackles in the bases.  ABDOMEN:  Shows active bowel sounds. Her abdomen is soft and nontender.  There are no palpable masses or organomegaly.  EXTREMITIES:  Shows trace ankle edema bilaterally. Pedal pulses are not  palpable in the right foot, but her left dorsalis pedis and posterior tibial  pulses are both palpable.  NEUROLOGICAL:  Shows him to be alert and oriented x3. Motor and sensory  exams are grossly normal.  SKIN:  Warm and dry.   IMPRESSION:  The patient has significant three-vessel coronary artery  disease status post non-Q-wave myocardial infarction. She has a murmur on  exam and 3+ mitral regurgitation by catheterization, and this will require  echocardiogram to evaluate her mitral regurgitation. I will make a decision after the echocardiogram whether she needs intervention  for this. I  discussed the operative procedure of coronary artery bypass surgery with her  including alternatives, benefits, and risks including bleeding, blood  transfusion, infection, stroke, myocardial infarction. Her and her husband  and daughter understand and agree to proceed. We will tentatively plan to do  this on Thursday,  June 15, 2003.                                               Alexandra [name], M.D.    BB/MEDQ  D:  06/13/2003  T:  06/14/2003  Job:  [id number]

## 2010-11-08 NOTE — Procedures (Signed)
NAME:  ARADHYA, DUSEL                          ACCOUNT NO.:  [id number]   MEDICAL RECORD NO.:  [id number]                   PATIENT TYPE:  INP   LOCATION:                                       FACILITY:   PHYSICIAN:  Margaretmary Eddy, M.D.             DATE OF BIRTH:  January 13, 1922   DATE OF PROCEDURE:  06/09/2003  DATE OF DISCHARGE:                                EKG INTERPRETATION   EKG #1:  June 09, 2003 at 3:30  EKG revealed junctional rhythm with diffuse symmetric T wave inversions  suggestive of acute ischemia.   EKG #2:  June 09, 2003 at 4:40 p.m.  EKG reveals normal sinus rhythm with anterolateral T wave inversion,  symmetric in nature, suggestive of acute ischemia.  The ST segment in V2 and  V3 is slightly elevated, suggestive of acute anterior infarction.      ___________________________________________                                            Margaretmary Eddy, M.D.   WSL/MEDQ  D:  07/06/2003  T:  07/06/2003  Job:  [id number]

## 2010-11-08 NOTE — Op Note (Signed)
NAME:  Alexandra [name], Alexandra [name]                          ACCOUNT NO.:  [id number]   MEDICAL RECORD NO.:  [id number]                   PATIENT TYPE:  INP   LOCATION:  2301                                 FACILITY:  [facility]   PHYSICIAN:  Gilford Raid, M.D.                  DATE OF BIRTH:  1921-11-19   DATE OF PROCEDURE:  06/15/2003  DATE OF DISCHARGE:                                 OPERATIVE REPORT   PREOPERATIVE DIAGNOSES:  1. Severe 3-vessel coronary artery disease.  2. Severe aortic insufficiency.  3. Severe tricuspid regurgitation.   POSTOPERATIVE DIAGNOSIS:  1. Severe 3-vessel coronary artery disease.  2. Severe aortic insufficiency.  3. Severe tricuspid regurgitation.   PROCEDURE:  1. Median sternotomy.  2. Extracorporeal circulation.  3. Coronary artery bypass graft surgery x5 using a left internal mammary     graft  to the left anterior descending coronary artery, saphenous vein     graft to the diagonal branch of the LAD, a  sequential saphenous vein     graft to the 1st and 2nd obtuse marginal branches of the left circumflex     coronary artery and a saphenous vein graft to the posterior descending     coronary artery.  4. Aortic valve replacement with a 21-mm pericardial valve.  5. Tricuspid valve repair using a 26-mm annuloplasty ring.  6. Endoscopic vein harvest from the right leg.   SURGEON:  Gilford Raid, M.D.   ASSISTANTS:  Mardene Celeste, N.P. and Jacinta Shoe, P.A.-C.   ANESTHESIA:  General endotracheal anesthesia.   INDICATIONS FOR PROCEDURE:  This patient is a 77 year old white female who  was originally admitted to [facility] with gallstone pancreatitis.  She had an episode of hypertension, bradycardia and diaphoresis there and  ruled in for non Q-wave myocardial infarction.   Cardiac catheterization showed severe 3-vessel coronary artery disease. The  LAD had 50% and 60% proximal stenosis and then 90% mid vessel stenosis. The  left circumflex  had 2 marginal branches. The 1st one was smaller and had 90%  proximal stenosis. There was 50% stenosis in the AV groove portion of the  circumflex before a large 2nd marginal. The right coronary artery had an  ostial 80% stenosis. There was about 70% proximal  stenosis. There was 50%  to 60% distal stenosis. The left ventricular ejection fraction was about 30%  to 35% with akinesis of the distal anterior wall apex and distal inferior  wall. There was also 3+ mitral regurgitation noted, but it was unclear  whether the catheter was causing this. There was no gradient across the  aortic valve. End diastolic pressure  was 35.   I was asked to evaluate the patient for coronary artery bypass graft surgery  and possible  mitral valve surgery. An echocardiogram  was obtained and this  surprisingly showed moderate to severe  aortic insufficiency as well as  severe  tricuspid regurgitation. There was also severe  pulmonary  hypertension with mild to moderate pulmonary  insufficiency. There was only  mild mitral regurgitation but with a structurally normal valve. Ejection  fraction was about 30% to 35%. This was a significant change from her last  echocardiogram  back in May of 2004, which showed normal left ventricular  function with no valvular abnormalities.   After review of this study, I felt that the best treatment would be coronary  artery bypass graft surgery as well as aortic valve replacement and  tricuspid valve repair. I discussed  the operative procedure with the  patient and her family  including  alternatives, benefits and risks  including  bleeding, blood transfusion, infection, stroke, myocardial  infarction, graft failure and death.  They understood the high risk nature  of this surgery, especially in a 77 year old patient and agreed to proceed.   DESCRIPTION OF PROCEDURE:  The patient was taken to the operating room and  placed on the table in the supine position. After  induction of general  endotracheal anesthesia a Foley catheter was placed in the bladder using  sterile technique. Then the chest, abdomen  and both lower extremities were  prepped and draped in the usual sterile manner. The patient did have severe  pulmonary hypertension with a PA pressure of 70/40 before she was put under  anesthesia.   A transesophageal echocardiogram  was performed and showed moderate to  severe aortic insufficiency as well as severe  tricuspid regurgitation with  a large right atrium. There was trivial mitral regurgitation. The left  ventricular function appeared moderately depressed with an ejection fraction  of about 35%. The right ventricle was dilated and hypokinetic.   Then the chest was opened through a median sternotomy incision. The  pericardium was opened in the midline. Examination  of the heart  showed  hypokinesis of the right ventricle which appeared  somewhat bulging. The  ascending aorta had no palpable plaques in it. There was a moderate sized  serous pericardial effusion present.   Then the left internal mammary artery was harvested from the chest wall as a  pedicle graft. This was a medium caliber vessel with excellent blood flow  through it. There was also large left and right pleural effusions that were  serous. At the same time a segment of greater saphenous vein was harvested  from the right leg using endoscopic vein harvest technique. We examined the  vein in the left leg at the knee, but this vein was small and not felt to be  any better.   Then the patient was heparin. When an adequate activated clotting time was  achieved, the distal ascending aorta was cannulated using a 20 French  aortic cannula for arterial inflow. Venous outflow was achieved using  bicaval venous cannulation. A 24 French right-angle metal tipped cannula was placed through a pursestring suture in the superior vena cava and a 36  [place]  plastic right-angle cannula  through a pursestring suture in the low  right atrium. An antegrade cardioplegia and vent cannula was inserted in the  aortic root. A left ventricular vent was placed through the right superior  pulmonary  vein. A retrograde cardioplegia cannula was inserted through the  right atrium into the coronary sinus.   The patient was placed on cardiopulmonary bypass and the distal coronary  artery was identified. The LAD was a medium sized graftable vessel. The  diagonal branch was small  but graftable.  The 1st marginal was small  but  graftable. The 2nd marginal was a slightly larger vessel which was diffusely  diseased. The posterior descending artery was a medium sized graftable  vessel. There was evidence of previous  inferior  infarct with some scar  present.   Then the aorta was cross clamped and 300 mL of cold blood antegrade  cardioplegia was administered in the aortic root. There was poor root  pressure due to the aortic insufficiency and therefore  another 300 mL of  cold blood retrograde cardioplegia was administered with quick arrest of the  heart. Systemic hypothermia to 20 degrees centigrade and topical hypothermia  with iced saline was used. A temperature probe was placed in the septum and  an insulating pad on the pericardium.   The 1st distal  anastomosis was performed to the posterior descending  coronary artery. The internal diameter was 1.6 mm. The conduit used was a  segment of greater saphenous vein and the anastomosis performed in an end-to-  side manner using continuous 7-0 Prolene suture. Flow was measured through  the graft and was excellent. Then a dose of antegrade cardioplegia was given  down this vein graft.   The 2nd distal  anastomosis was performed to the 1st marginal branch.. The  internal diameter was about 1.5 mm. The conduit used was a 2nd segment of  greater saphenous vein and the anastomosis performed in a  sequential side-  to-side manner using  continuous 7-0 Prolene suture. Flow was measured  through the graft and was excellent.   The 3rd distal  anastomosis was performed to the 2nd marginal branch. The  internal diameter of this vessel was about 1.6 mm. The conduit used was the  same segment of greater saphenous vein and the anastomosis performed in a  sequential end-to-side manner using continuous 7-0 Prolene suture. Flow was  measured through the graft and was excellent. Then another dose of antegrade  cardioplegia was given down both vein grafts and in the aortic root.   The 4th distal  anastomosis was performed to the diagonal branch. The  internal diameter was about 1.6 mm. The conduit used was a 3rd segment of  greater saphenous vein and the anastomosis performed in an end-to-side  manner using continuous 7-0 Prolene suture.   The 5th distal  anastomosis was performed to the midportion of the left anterior descending coronary artery. The internal diameter was about 1.75  mm. The conduit used was left internal mammary artery graft  and this was  brought out through an opening in the left pericardium anterior to the  phrenic nerve. It was anastomosed to the LAD in an end-to-side manner using  continuous 8-0 Prolene suture. The pedicle was tacked to the epicardium with  6-0 Prolene sutures. Then another dose of retrograde cardioplegia was given  followed  by a dose of antegrade cardioplegia down the vein grafts.   Attention was turned aortic valve replacement. During this procedure  additional doses of retrograde cardioplegia were given at about 20 minute  intervals followed  by a dose of antegrade cardioplegia down the vein grafts  to be sure there was good right ventricular protection.   Then the aorta was opened through a transverse incision at the level of the  fine tubular junction. Examination  of the native valve showed there was a 3  leaflet valve. There was significant contraction of the noncoronary leaflet   with some calcification along the edge of the leaflet, and therefore  poor  coaptation with the other  leaflets. I did not feel that this was something  which could be repaired. Therefore, the native valve was excised. The  annulus was sized and a 21-mm pericardial valve was chosen.   Then a series of pledgeted 2-0 Ethibond horizontal mattress sutures were  placed around the annulus with the pledgets in a subannular position. The  sutures were placed through the sewing and the valve lowered into place. The  suture was tied  sequentially. The valve seated nicely. Then the aorta was  closed in 2 layers using continuous 4-0 Prolene suture with pledgets on both  ends. Then the 3 proximal vein graft anastomoses were performed to the  aortic root in an end-to-side manner using continuous 6-0 Prolene suture.   Then attention was turned to tricuspid valve repair. Caval tapes were placed  around the superior  and inferior vena cava. The right atrium was opened  through a vertical incision from the right atrial appendage down to the site  of the retrograde cardioplegia cannula. A retractor was placed and  examination  of the tricuspid valve showed that the valve leaflets were  intact with normal subvalvular apparatus. There were no ruptured cords.  There was no significant prolapse of the valve. There was a large area of  central regurgitation when the ventricle was filled with saline, and I  suspect that this was due to annular dilatation, since the annulus appeared  quite large.   The anterior leaflet of the valve was sized and a 26-mm tricuspid  annuloplasty ring was chosen. Then a series of 2-0 Ethibond sutures were  placed around the annulus. No sutures were placed in the area of the bundle  of His and the atrial ventricular node. Then the sutures were placed through  the annuloplasty ring and the ring lowered into place. The sutures were tied sequentially. The ring seated nicely.   The  right ventricle was then filled with iced saline and the valve appeared  completely competent. Then the right atrium was closed in 2 layers using  continuous 4-0 Prolene suture. The patient was rewarmed to 37 degrees  centigrade. The left side of the heart  was deaired. The clamp was removed  from the mammary pedicle and there was rapid rewarming of the ventricular  septum. The head was placed in the Trendelenburg position and the cross  clamp  was removed with a time of 212 minutes. There was spontaneous return  of sinus rhythm. The proximal  and distal  anastomoses appeared  hemostatic  and the lie of the graft  satisfactory. Graft  markers were placed around  the proximal  anastomosis. Two temporary right ventricular and right atrial  pacing wires were placed and brought out thorough the skin.   When the patient had rewarmed to 37 degrees centigrade, she was weaned from  cardiopulmonary bypass on Dopamine and milrinone. Total bypass time was 251  minutes. Transesophageal echocardiogram  was performed and showed normal  functioning aortic valve prosthesis. There was no evidence of perivalvular  leak or regurgitation. There was trivial mitral regurgitation. There was no  tricuspid regurgitation noted. Left ventricular function appeared  well  preserved with some hypokinesis of the septum. The right ventricle appeared  dilated and hyperkinetic as it did before.   The patient remain hemodynamically stable off bypass with a cardiac output  of about 4 liters a minute. Protamine was given and the venous and aortic  cannulas were removed without difficulty.  Hemostasis was achieved. The  patient was given 10 units of platelets.  Four chest tubes were placed with  2 in the posterior  pericardium, 1 in the anterior mediastinum and bilateral  pleural tubes. The pericardium was not closed over the heart.   The sternum was closed with #6 stainless steel wires. The fascia was closed  with continuous  #1 Vicryl suture. The subcutaneous tissue was closed with  continuous 2-0 Vicryl and the skin with 3-0 Vicryl subcuticular closure. The  lower extremity vein harvest sites were closed in layers in a similar  manner.   All sponge, instrument and needle counts were correct according to the scrub  nurse. Dry sterile dressings were applied over the incisions, around the  chest tubes and were hooked to Pleurovac suction .The patient remained  hemodynamically stable and was transported to the SICU in guarded but stable  condition.                                               Gilford Raid, M.D.    BB/MEDQ  D:  06/15/2003  T:  06/17/2003  Job:  [id number]   cc:   [facility]
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
## 2010-11-08 NOTE — Procedures (Signed)
NAME:  ONEDA, GOWARD                          ACCOUNT NO.:  [id number]   MEDICAL RECORD NO.:  [id number]                   PATIENT TYPE:  OUT   LOCATION:  RAD                                  FACILITY:  [facility]   PHYSICIAN:  Scarlett Presto, M.D.                DATE OF BIRTH:  [birth year]   DATE OF PROCEDURE:  07/24/2003  DATE OF DISCHARGE:                                  ECHOCARDIOGRAM   TAPE NUMBER:  [id number], tape count [id number].   INDICATION:  This is a 77 year old woman who was recently status post aortic  valve replacement with a Carpentier-Edwards prosthesis as well as placement  of a tricuspid ring and bypass surgery.  This is a postoperative study.   TECHNICAL QUALITY:  Adequate.   M-MODE TRACINGS:  1. The aorta is 28 mm.  2. The left atrium is 53 mm.  3. The septum is 17 mm.  4. The posterior wall is 13 mm.  5. Left ventricular diastolic dimension is 36 mm.  6. Left ventricular systolic dimension is 30 mm.   2-D AND DOPPLER IMAGING:  The left ventricle is normal size with normal  systolic function.  Estimated ejection fraction 50-55%.  There is a mild,  postoperative septum seen, but no obvious wall motion abnormalities other  than that.  The right ventricle is normal size with normal systolic  function.   Both atria are enlarged, left greater than right.  There is a pacer wire  seen in both the left atrium and left ventricle.   The aortic valve is prosthetic and appears to be bioprosthetic.  There is no  evidence of perivalvular leak.  The valve is well seated.  There is one view  in the four chamber view where there is a small jet seen to cross from the  left ventricle to the left atrium which I believe is not a ventricular  septal defect, but a small area of suture line flow.   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   The tricuspid valve appears to be postoperative.  The tricuspid ring is not  well seen, but there is no tricuspid  regurgitation seen.   The pulmonic valve was not well seen.   The pericardial structures are normal.   The ascending aorta was not well seen.   The inferior vena cava appears to be normal size.      ___________________________________________                                            Scarlett Presto, M.D.   JH/MEDQ  D:  07/24/2003  T:  07/24/2003  Job:  [id number]

## 2010-11-08 NOTE — Consult Note (Signed)
NAME:  TEYANNA, GARCIAGONZALEZ                          ACCOUNT NO.:  [id number]   MEDICAL RECORD NO.:  [id number]                   PATIENT TYPE:  INP   LOCATION:  A203                                 FACILITY:  [facility]   PHYSICIAN:  Scarlett Presto, M.D.                DATE OF BIRTH:  1922-03-28   DATE OF CONSULTATION:  DATE OF DISCHARGE:                                   CONSULTATION   CARDIOLOGY CONSULTATION   PRIMARY CARE Emanuelle Bastos:  Scott A. Wolfgang Phoenix, M.D.   REQUESTING PHYSICIAN:  Vernon Prey. Tamala Julian, M.D.   CARDIOLOGIST:  Scarlett Presto, M.D.   HISTORY OF PRESENT ILLNESS:  Ms. Heppler is an 77 year old woman whom I follow  for coronary artery disease and valvular heart disease.  She is recently  status post a coronary artery bypass surgery with aortic valve replacement  and tricuspid valve repair under the care of Dr. Gilford Raid.  This was all  performed in December of 2004.  She now presents with abdominal pain, was  found to have gallstone pancreatitis and cholecystitis and gallstones and is  pending a laparoscopic cholecystectomy.  We were asked to see her for  preoperative assessment.   Ms. Lozeau has recovered extremely well from her bypass surgery. She is now  more than 6 months out from her bypass surgery and really is asymptomatic  from cardiology standpoint.  For any 77 year old woman she is very vigorous  and active.  She has minimal shortness of breath and no chest discomfort.   PAST MEDICAL HISTORY:  Her past medical history is significant for  hypertension, hypothyroidism, and gastroesophageal reflux disease.  She has  sick sinus syndrome, status post pacemaker placement after her valve  replacement surgery.  She had severe 3-vessel coronary disease and underwent  a bypass surgery on June 15, 2003 with a LIMA to her LAD, a saphenous  vein graft to the first diagonal.  A saphenous vein graft to the first and  the second obtuse marginal; and then a saphenous vein graft to the  posterior  descending coronary artery. Her ejection fraction on operation was 35%.  It  subsequently increased to 50-55% on an echocardiogram done postop.  She had  severe aortic insufficiency and underwent a pericardial tissue valve, 21 mm.  She also had postoperative bradycardia and required a pacemaker placement at  that time.  An echocardiogram revealed her to have severe tricuspid  regurgitation and so it was deemed appropriate to repair her tricuspid valve  and that was done with a 26 mm Annuloplasty ring.   REVIEW OF SYSTEMS:  Her review of systems is generally negative except for  that reviewed in the history of present illness.  She does not smoke  cigarettes and never has.  She does not drink any alcohol.  She does not use  any illicit drugs.   MEDICATIONS:  1. Her medications currently are aspirin 81 mg  a day.  2. Cefazolin 1 gm q.6h. IV.  3. Chlorthalidone 25 mg once a day.  4. Synthroid 112 mcg once a day.  5. Toprol XL 50 mg a day.  6. Protonix 40 mg a day.  7. Altace 20 mg a day.  8. Zocor 40 mg a day.  9. She takes Tylenol, morphine sulfate, and Phenergan on a p.r.n. basis.   ALLERGIES:  She is allergic to DARVOCET, it makes her nauseated.   FAMILY HISTORY:  Her family history is noncontributory.   PHYSICAL EXAMINATION:  VITAL SIGNS:  On physical exam her pulse is 78 and  regular, respiratory rate is 24.  Her blood pressure is 135/77.  She weighs  133 pounds.  HEAD, EYES, EARS, NOSE, AND THROAT:  Unremarkable.  NECK:  The neck is supple.  There is no jugular venous distention or carotid  bruits.  CHEST:  Her chest is clear to auscultation bilaterally.  She has a well-  healed, median sternotomy scar.  CARDIAC:  Her heart reveals a nondisplaced point of maximal impulse.  No  lifts or thrills.  First heart sound is normal.  Second heart sounds is a  little bit soft. She has no S3 or S4.  She has a 2/6 mid-peaking systolic  murmur heard best at the midleft sternal  border.  No diastolic murmurs are  noted.  ABDOMEN:  Her abdomen is soft, slightly tender over the right subcostal  area.  EXTREMITIES:  The lower extremities are without clubbing, cyanosis, or  edema.  Her vein harvest scars are well healed.  She has 1+ pulses  throughout.   LABORATORIES:  She had a blood gas which shows a pH of 7.37, pCO2 of 37, pO2  of 71, 94% saturation on room air.  White blood cell count of 6.8, H&H of 13  and 38 with a platelet count of 201. Sodium 137, potassium 4.3, chloride  102, bicarb 24, BUN 13, creatinine 1.7, and her blood sugar is 149.  Liver  function studies show elevated alkaline phosphatase, elevated O2 and PT with  elevated amylase and lipase consistent with her gallstones.  The CT scan of  her abdomen and pelvis show gallstones with cholecystitis and pancreatitis.  Her electrocardiogram was not performed.  Her chest x-ray was also not  performed.   RECOMMENDATIONS:  So my recommendations are:  1. That a chest x-ray and an EKG be performed preop and I will write for     those orders.  2. She is a moderate risk for a procedure which is relatively     straightforward and the general endotracheal anesthetic.  I think that     there is no further cardiac evaluation that needs to be done.  We will     follow her with you postop.  3. With regards to her pacemaker it is of importance that if you are going     to use electrocautery which, I think, is a common technique used in     laparoscopic surgery, that she may need to have her pacemaker     reinterrogated before she leaves the hospital just to make sure that its     settings are appropriate.  4. The biggest concern with Ms. Roda is that she will develop postoperative     atrial fibrillation which is a common problem for her, and this may need     to be treated aggressively.      ___________________________________________  Scarlett Presto, M.D.    JH/MEDQ  D:  02/12/2004  T:  02/12/2004  Job:  [id number]

## 2010-11-08 NOTE — Op Note (Signed)
NAME:  Alexandra [name], Alexandra [name]                          ACCOUNT NO.:  [id number]   MEDICAL RECORD NO.:  [id number]                   PATIENT TYPE:  INP   LOCATION:  2301                                 FACILITY:  [facility]   PHYSICIAN:  Crissie Sickles. Conrad [place], M.D.                DATE OF BIRTH:  05-26-1922   DATE OF PROCEDURE:  06/15/2003  DATE OF DISCHARGE:                                 OPERATIVE REPORT   PROCEDURE:  Transesophageal echocardiogram and cardiographic probe  placement, with interpretation during aortic and tricuspid valve  replacement.   INDICATIONS FOR PROCEDURE:  I was consulted by Dr. Gilford Raid to place the  transesophageal echocardiographic probe into Alexandra [name], to monitor her  during her heart surgery procedure.  The patient had known aortic  insufficiency and tricuspid regurgitation, as well as coronary artery  disease.  She underwent an aortic valve replacement, tricuspid valve  replacement and coronary artery bypass grafting.   DESCRIPTION OF PROCEDURE:  After uneventful induction of anesthesia and  endotracheal intubation, the transesophageal probe was easily passed into  the stomach.  She has had a history of her esophagus being dilated in the  past, but there was no obstruction to the passing of the probe.   Initial short axis view of the left ventricle showed left ventricular  hypertrophy, without any wall motion abnormalities.  Turning to the four-  chamber view, the mitral valve structurally looked normal.  The leaflets  collected normally and placing color flow  Doppler across the valve, there  was a trace of central mitral regurgitation.  The left atrium was normal and  flow in the pulmonary veins was normal.   Turning to the aortic outflow tract, and placing color flow Doppler over  this area, there was 2-3+ aortic insufficiency.  There was no fluttering of  intraleaflet of the mitral valve.   Looking at the aortic valve, there was an obvious defect and asystole  on the  30-degree view, and placing color flow Doppler along the tibial view, 2-3+  aortic insufficiency could be seen.  There was no aortic stenosis.   Turning to the right side of the heart, the atrial septum was normal.  The  right atrium was normal.  A large regurgitant jet from the tricuspid valve  could be visualized.  It was difficult to visualize the structure of the  tricuspid valve.  The pulmonary artery catheter could be seen traversing the  bowels.   The patient was then placed on cardiopulmonary bypass by Dr. Cyndia Bent, and  underwent an aortic and tricuspid valve replacement, as well as coronary  artery bypass grafting.   Immediately prior to discontinuing cardiopulmonary bypass, there was minimal  intracardiac acing and on looking at the aortic valve in the longitudinal  position, there were no perivalvular jets.  In addition, degenerative aortic  insufficiency was __________.   The patient was discontinued on cardiopulmonary  bypass uneventfully on  minimal inotropes.  At this point I got a good view of the tricuspid valve  in the four-chamber view, which showed the tricuspid valve to be competent  and the right atrium was normal.  There was trace to 1+ central mitral  regurgitation, and the replaced aortic valve could be seen to be functioning  normally and there were no perivalvular jets.  The aortic insufficiency jet  in the aortic outflow tract was absent.   At the end of the procedure the transesophageal echocardiographic probe was  uneventfully removed from the patient.  The patient was transported to the  intensive care unit, intubated in stable condition.                                               Crissie Sickles Conrad [place], M.D.    KDO/MEDQ  D:  06/15/2003  T:  06/18/2003  Job:  [id number]

## 2010-11-08 NOTE — Assessment & Plan Note (Signed)
[facility] OFFICE NOTE   [name], Alexandra [name]                       MRN:          [id number]  DATE:09/02/2006                            DOB:          04/09/1922    [name] returns today for followup.  She is a very pleasant elderly  woman who is followed by Dr. Stanford Breed and Dr Sallee Lange.  She has a  history of symptomatic bradycardia and status post aortic valve  replacement.  She had a history of bradycardia and is status post  pacemaker insertion back in 2005 with a Medtronic Kappa 900 placed at  that time.  She returns today for followup.  She has had no syncope.  She does have very mild dyspnea.  She notes she has been able to walk  much more lately and overall her shortness of breath is improved.  She  denies chest pain.  She has occasional episodes of back pain.  On exam,  she is a pleasant, well-appearing woman in no distress.  Blood pressure  is 152/93, the pulse was 90 and regular, respirations were 18, the  weight was 158 pounds.  Neck revealed no jugular venous distention.  Lungs were clear bilaterally to auscultation.  There are no wheezes,  rales, or rhonchi.  Cardiovascular exam revealed a regular rate and  rhythm with normal S1 and S2.  The extremities demonstrated no edema.   Interrogation of her pacemaker demonstrates a Medtronic Kappa 900 with  no P waves.  The R waves were 11.  The impedance 407 in the atrium, 686  in the ventricle.  The threshold 0.75 at 0.4 in the right atrium and 0.5  at 0.4 in the right ventricle.  The battery voltage was 2.78 volts.  There were no high rate episodes noted.   IMPRESSION:  1. Symptomatic bradycardia.  2. History of aortic valve replacement.   DISCUSSION:  Overall, [name] is stable.  Her pacemaker is working  normally.  Her dyspnea is improved.  We will plan to see her back in the  office in 1 year for followup.     Champ Mungo. Lovena Le, MD  Electronically Signed    GWT/MedQ  DD: 09/02/2006  DT: 09/04/2006  Job #: [id number]   cc:   Nicki Reaper A. Wolfgang Phoenix, MD

## 2010-11-08 NOTE — Consult Note (Signed)
NAME:  [name], Alexandra A                          ACCOUNT NO.:  [id number]   MEDICAL RECORD NO.:  [id number]                   PATIENT TYPE:  INP   LOCATION:  A318                                 FACILITY:  [facility]   PHYSICIAN:  Leane Para C. Tamala Julian, M.D.                DATE OF BIRTH:  Feb 03, 1922   DATE OF CONSULTATION:  06/07/2003  DATE OF DISCHARGE:                                   CONSULTATION   HISTORY OF PRESENT ILLNESS:  This is a 77 year old female who was admitted  on June 06, 2003, by Dr. Sallee Lange with the complaint of acute onset  of epigastric pain radiating through to her back with nausea, vomiting.  The  pain started on the early morning of December 14, after breakfast.  She had  nausea and vomiting throughout the day.  She presented to the emergency room  later that afternoon where evaluation revealed elevated amylase and lipase.  Abdominal series revealed nonspecific bowel gas pattern.  The patient,  subsequently, was found to have amylase of 3676 with lipase 5900 that was  also markedly elevated.  Cardiac enzymes were negative.  Liver function  studies were normal including total bilirubin of 1.3.  BUN was mildly  elevated at 24.  Creatinine was 1.5.  She was treated symptomatically, and  her symptoms have improved.  She had ultrasound done earlier this morning  which showed thickened gallbladder with pericholecystic fluid and normal  common bile duct of 5 mm.  Liver was normal.  Pancreatic duct was normal at  2.8 mm.  It is felt that she had an episode of biliary pancreatitis and  probably has passed a stone.  She is improving symptomatically, but we need  to make sure that her pancreatitis is trending downward before considering  operative treatment.   PAST MEDICAL HISTORY:  1. Peptic ulcer disease and underwent endoscopy in July 2003 which showed     antral ulcer with some antral erosions.  2. She had Schatzki's ring that was dilated to 54 [place] using Maloney  dilators.  3. Follow up EGD in October 2003 which showed healing of the ulcer with     resolution of the erosions.  4. She presented in May 2004 with chest pain and was transferred to the     [facility] for evaluation.  Cardiac enzymes were normal.     EKG was nondiagnostic.  Adenosine stress Cardiolite was negative for     ischemia.  Echocardiogram showed ejection fraction of 55-65% with no wall     motion abnormalities with mildly calcified aortic valve and a mildly     dilated left atrium.  The patient was discharged on medications.   DISCHARGE MEDICATIONS:  1. Aspirin 81 mg daily.  2. Chlorthalidone 50 mg daily.  3. Crestor 10 mg q.h.s.  4. Altace 10 mg daily.  5. Prilosec 20 mg daily.  6.  Synthroid 112 mcg daily.  7. Adalat 30 mg daily.  8. Nitroglycerin p.r.n.  9. Xanax p.r.n.   OTHER MEDICAL PROBLEMS:  1. Hypertension.  2. Hyperlipidemia.  3. Hypothyroidism.  4. Anxiety disorder.   ALLERGIES:  She has no drug allergies, but she does not tolerate beta  blockers because of bradycardia.   PHYSICAL EXAMINATION:  GENERAL APPEARANCE:  Pleasant elderly female in no  acute distress.  General physical examination is unremarkable.  VITAL SIGNS:  Stable, afebrile.  ABDOMEN:  Mildly distended with hyperactive bowel sounds.  Mild epigastric  right upper quadrant and left upper quadrant tenderness.  There is a  suggestion of tenderness in the left lower quadrant.  No masses were felt.   IMPRESSION:  1. Acute biliary pancreatitis.  2. Acute cholecystitis.  3. Noncardiac chest pain with normal Adenosine stress Cardiolite study and     echocardiogram without ischemia and with normal left ventricular     function.  4. Gastroesophageal reflux disease.  5. History of gastric ulcer with documented healing.  6. Anxiety disorder.  7. Hypertension.  8. Osteoporosis.   RECOMMENDATIONS:  The patient should be continued on n.p.o. status and  monitoring until it is  demonstrated that her pancreatic enzymes are trending  downward.  If her pain improves and her enzymes return to near normal range,  we can proceed with cholecystectomy at that time.  She should have  cholecystectomy before discharge.  Liver functions remain normal.  It is  probable that she has passed her stone.  I will follow her closely until she  is discharged.  It may take 24-48 hours more for her pancreatitis to resolve  before surgery is done.  I have tentatively scheduled her to have surgery on  December 16, but more than likely, it will have to be done on December 17 or  18.  This has been discussed with the patient, her husband and her two  daughters.  I will see her in the morning, and will make a decision about  scheduling operative therapy.      ___________________________________________                                            Vernon Prey. Tamala Julian, M.D.   LCS/MEDQ  D:  06/07/2003  T:  06/07/2003  Job:  [id number]

## 2010-11-08 NOTE — Discharge Summary (Signed)
NAME:  Alexandra [name], Alexandra [name]                          ACCOUNT NO.:  [id number]   MEDICAL RECORD NO.:  [id number]                   PATIENT TYPE:  INP   LOCATION:  2016                                 FACILITY:  [facility]   PHYSICIAN:  Jacqulyn Ducking, M.D.               DATE OF BIRTH:  May 22, 1922   DATE OF ADMISSION:  10/23/2002  DATE OF DISCHARGE:  10/24/2002                           DISCHARGE SUMMARY - REFERRING   PROCEDURES:  1. Carotid ultrasound.  2. Adenosine Cardiolite.   HOSPITAL COURSE:  The patient is a 77 year old female with no known history  of coronary artery disease.  She was admitted to the hospital on Oct 23, 2002  for chest pain.  She woke up with chest pain on the day of admission, which  she described as a 10/10, radiating to her back.  She took four tablets and  came to the emergency room.  The pain subsided after a GI cocktail and  sublingual nitroglycerin.  She was pain-free at the time of exam.  She was  admitted to rule out MI and for further evaluation.   The patient's enzymes were negative for MI and she was scheduled for a gated  exercise treadmill Cardiolite on Oct 24, 2002.  She attempted the treadmill  portion but was unable to complete it secondary to an unsteady gait and  danger of falling, so she was changed to an adenosine Cardiolite.  The  adenosine Cardiolite went without incident and the results are reported as  no ischemia and an EF of 72%.  There was also no scar.  Additionally, she  had carotid Dopplers performed and these showed no significant stenosis.  She had a 2-D echocardiogram performed and those results showed an EF of 55-  65% with no wall motion abnormalities.  The aortic valve was mildly  calcified and the left atrium was mildly to moderately dilated.  The  pulmonary vein were grossly normal and the estimated peak right ventricular  systolic pressure was elevated at 40.  The inferior vena cava and right  atrium were mildly dilated.  The  echocardiogram and other results were  evaluated by Dr. Jacqulyn Ducking, who felt that since the patient was pain-  free, she was considered stable for discharge on Oct 24, 2002, with  outpatient followup arranged.   LABORATORY VALUES:  Hemoglobin 11.5, hematocrit 34.5, WBC 6.5, platelets  181,000.  Sodium 137, potassium 3.5, chloride 105, CO2 26, BUN 16,  creatinine 1.3, glucose 101, calcium 8.8.  Total cholesterol 157,  triglycerides 110, HDL 46, LDL 99.  Serial CK-MBs and troponin I negative  for MI.  TSH 1.033.   Chest x-ray:  Cardiomegaly present with mild pulmonary vascular congestion  but no effusion and no acute bony abnormality.   DISCHARGE CONDITION:  Stable.   DISCHARGE DIAGNOSES:  1. Chest pain, negative myocardial infarction by enzymes and negative     ischemia by  Cardiolite with preserved left ventricular function by     Cardiolite and echocardiogram.  2. Hyperlipidemia.  3. Hypertension.  4. Hypothyroidism.  5. History of anxiety disorder.  6. Family history of coronary artery disease and cerebrovascular accident.  7. Gastroesophageal reflux disease.  8. Hypokalemia, supplemented and resolved.   DISCHARGE INSTRUCTIONS:  1. Her activity level is to be as tolerated.  2. She is to stick to a low-fat diet.  3. She is to follow up with Dr. Nicki Reaper A. [name] as scheduled.  4. She is to follow up with Dr. Lattie [name] in two months and call for an     appointment.   DISCHARGE MEDICATIONS:  1. Coated aspirin 81 mg two tabs daily.  2. Chlorthalidone 50 mg daily.  3. Crestor 10 mg q.h.s.  4. Altace 10 mg daily.  5. Prilosec 20 mg daily.  6. Synthroid 112 mcg daily.  7. Xanax p.r.n.  8. Adalat 30 mg daily.  9. Nitroglycerin p.r.n.     Davis Gourd, P.A. [facility]                  Jacqulyn Ducking, M.D.    RG/MEDQ  D:  10/24/2002  T:  10/25/2002  Job:  [id number]   cc:   Alexandra Reaper A. Wolfgang Phoenix, M.D.  [address], [place] [address]  Fax: [phone]   Jacqulyn Ducking, M.D.

## 2010-11-08 NOTE — Cardiovascular Report (Signed)
NAME:  Alexandra [name], Alexandra [name]                          ACCOUNT NO.:  [id number]   MEDICAL RECORD NO.:  [id number]                   PATIENT TYPE:  INP   LOCATION:  6533                                 FACILITY:  [facility]   PHYSICIAN:  Christy Sartorius, M.D.                   DATE OF BIRTH:  07-16-1921   DATE OF PROCEDURE:  06/13/2003  DATE OF DISCHARGE:                              CARDIAC CATHETERIZATION   PROCEDURES PERFORMED:  1. Left heart catheterization.  2. Left ventriculogram.  3. Selective coronary angiography.   DIAGNOSES:  1. Three-vessel coronary artery disease.  2. Moderate left ventricular systolic dysfunction.  3. 3+ mitral regurgitation.  4. Non-ST elevated myocardial infarction.  5. Peripheral vascular disease.   HISTORY:  Alexandra [name] is a 77 year old female who presents for assessment of  chest discomfort and non-ST elevation myocardial infarction.  The patient  presented to [facility] with acute abdominal pain and elevated  transaminases consistent with gallstone pancreatitis.  She has documented  stones and cholecystitis which will require surgical intervention.  The  patient unfortunately suffered a non-ST elevation myocardial infarction in  May 2004.  She presented with substernal chest discomfort and at that time  had stress imaging study suggesting no ischemia and normal LV function.  She  presents for further cardiac assessment.   TECHNIQUE:  Informed consent was obtained.  The patient brought to the  catheterization lab.  A 6 French sheath was placed in the right femoral  artery using the modified Seldinger technique.  Some trouble was encountered  advancing a JL-4 catheter through the right iliac artery.  Manual injection  contrast with a JR-4 catheter showed severe disease in the distal aorta and  iliac bifurcation.  A Wholey wire was used to cross the stenosis and short  exchange technique used thereafter.  JR-4 and JL-4 catheters were then used  to engage  the right and left coronary arteries and selective angiography  performed in various projections using manual injection contrast.  Subsequently, a 6 French pigtail catheter was advanced into the left  ventricle and a left ventriculogram performed using power injection  contrast. At the termination of the case, catheters and sheath were removed.  Manual pressure applied until adequate hemostasis was achieved.  The patient  tolerated the procedure well and was transferred to the floor in stable  condition.   FINDINGS:   LEFT HEART CATHETERIZATION:  1. Left main trunk:  Short, mild disease of 30%.  2. LAD:  This is a medium-caliber vessel that provides two trivial diagonal     branches in the mid section.  The LAD has moderate disease of 50-60% in     the proximal segment and near the take off of a large septal perforator.     There is then subtotal narrowing of 90-95% in the mid section between the     two diagonal branches.  The distal LAD then has diffuse irregularities of     30-40%.  3. Left circumflex artery:  This is a medium caliber vessel that provides a     trivial first marginal branch at the proximal segment, large second     marginal branch distally.  The AV circumflex has moderate disease of 50%.     The small first marginal branch has ostial disease of 90%.  The second     marginal branch has moderate disease of 40-50%.  4. Right coronary artery is dominant.  It is a medium caliber vessel that     provides small posterior descending artery in its terminal segment.  The     right coronary artery is heavily calcified and has an ostial narrowing of     80%.  There is then diffuse disease of 70% in the mid section with     narrowing of 50-60% in the distal section prior to the bifurcation.   LEFT VENTRICULOGRAPHY:  1. Dilated end-systolic and end-diastolic dimensions..  2. Overall left ventricular function is moderately impaired.  3. Ejection fraction approximately 30-35%.   4. There is akinesis of the distal, anterior, and apical walls.  3+ mitral     regurgitation is noted.  5. LV pressure is 150/20.  6. Aortic pressure is 150/65.  7. LVEDP equals 35.   DISTAL AORTOGRAM:  Distal aortogram was not performed.  However, manual  injection contrast at the iliac bifurcation shows severe atheromatous  disease and narrowings of 60% at the origin of the right iliac artery and  70% at the origin of the left iliac artery.   ASSESSMENT AND PLAN:  Alexandra [name] is a 77 year old female who suffered a non-  ST elevation anterior wall myocardial infarction.  She has severe disease of  the LAD and right coronary artery.  Further assessment with  electrocardiogram to determine the extent of mitral regurgitation will be  made and consideration was given toward surgical revascularization should  the patient be high risk for this and percutaneous intervention may be  considered and timing of her cholecystectomy will need to be discussed.  The  patient does have right lower extremity claudication and further assessment  and treatment may be made once the patient is recovered from her acute  event.                                               Christy Sartorius, M.D.    NG/MEDQ  D:  06/13/2003  T:  06/13/2003  Job:  [id number]   cc:   Scarlett Presto, M.D.  Fax: [phone]

## 2010-11-08 NOTE — Op Note (Signed)
NAME:  Alexandra [name], HRABIK                          ACCOUNT NO.:  [id number]   MEDICAL RECORD NO.:  [id number]                   PATIENT TYPE:  INP   LOCATION:  A203                                 FACILITY:  [facility]   PHYSICIAN:  Vernon Prey. Tamala Julian, M.D.                DATE OF BIRTH:  1922-05-05   DATE OF PROCEDURE:  02/13/2004  DATE OF DISCHARGE:                                 OPERATIVE REPORT   PREOPERATIVE DIAGNOSIS:  Cholelithiasis, cholecystitis, biliary  pancreatitis.   POSTOPERATIVE DIAGNOSIS:  Cholelithiasis, cholecystitis, biliary  pancreatitis.   PROCEDURE:  Laparoscopic cholecystectomy.   SURGEON:  Dr. Tamala Julian.   DESCRIPTION:  Under general anesthesia, the patient's abdomen was prepped  and draped in sterile field. Supraumbilical incision was made. Veress needle  was inserted uneventfully. Abdomen was insufflated with 3 liters of CO2.  Using a Visiport guide, a 10-mm port was placed. Laparoscope was placed.  Under videoscopic guidance, a 10-mm port and two 5-mm ports were placed in  the right subcostal region. The gallbladder was grasped and positioned.  Cystic artery was dissected, clipped with 3 clips, and divided. Cystic duct  was dissected between the gallbladder and the common bile duct. It was  clipped close to the gallbladder and then clipped distally with 4 clips. It  was divided. Posterior branch of the cystic artery was clipped with 3 clips  and divided. Gallbladder was then separated from the intrahepatic bed  without difficulty. There was no bleeding. There was no bowel leak. The  gallbladder was placed in an EndoCatch device and retrieved intact.  Inspection of the bed revealed no blood or bile leak. Copious irrigation was  carried out. CO2 was allowed to escape from the abdomen, and the ports were  removed. The fascial of the umbilical incision was closed with 0 Vicryl. The  skin incisions were closed with staples. OpSite dressings were placed. The  patient tolerated  the procedure well. She was awakened from anesthesia  uneventfully, transferred to a bed, and taken to the post anesthetic care  unit for further monitoring.      ___________________________________________                                            Vernon Prey. Tamala Julian, M.D.   LCS/MEDQ  D:  02/13/2004  T:  02/13/2004  Job:  [id number]

## 2010-11-08 NOTE — Consult Note (Signed)
NAME:  Alexandra [name], Alexandra [name] A                          ACCOUNT NO.:  [id number]   MEDICAL RECORD NO.:  [id number]                   PATIENT TYPE:  INP   LOCATION:  6533                                 FACILITY:  [facility]   PHYSICIAN:  Merri Ray. Grandville Silos, M.D.             DATE OF BIRTH:  1922-05-15   DATE OF CONSULTATION:  06/13/2003  DATE OF DISCHARGE:                                   CONSULTATION   REASON FOR CONSULTATION:  Evaluate cholecystitis in the setting of acute  myocardial infarction.   HISTORY OF PRESENT ILLNESS:  The patient is a 77 year old white female who  was admitted to [facility] last Tuesday with what sounds like a biliary  pancreatitis.  Abdominal ultrasound on June 07, 2003, had shown  cholecystitis.  According to her family, she had some elevated pancreatic  and liver enzymes at that time.  She was observed with plans for  cholecystectomy last Saturday, but on Friday she developed some cardiac  complications, including a non-ST change myocardial infarction.  She was  subsequently transferred to [facility] where she underwent cardiac  catheterization.  This showed significant coronary artery disease, including  a 90% lesion in her mid LAD and an 80% ostial occlusion of her right  coronary artery.  The patient has been eating a normal diet since Sunday  with no right upper quadrant abdominal pain, and when I see her today she is  currently eating lunch.  She denies abdominal pain, nausea, vomiting, and  claims she is feeling much better than on admission.   PAST MEDICAL HISTORY:  1. Hypertension.  2. Hyperlipidemia.  3. Bradycardia.  4. Coronary artery disease.  5. Acute myocardial infarction.  6. GERD.  7. Gastric ulcer.  8. Anxiety.  9. Hypothyroidism.   MEDICATIONS:  1. __________  infusion.  2. Synthroid.  3. Altace.  4. Zocor.  5. Aspirin.  6. Protonix.   ALLERGIES:  Intolerance to BETA BLOCKERS due to bradycardia.   REVIEW OF SYSTEMS:  GENERAL:   Feels much better than on admission.  CARDIAC:  Refer to the history of present illness.  PULMONARY:  No shortness of  breath.  GASTROINTESTINAL:  Please refer to history of present illness.  MUSCULOSKELETAL:  Negative.  GENITOURINARY:  Negative.   PHYSICAL EXAMINATION:  VITAL SIGNS:  Heart rate is 88, respirations 22, she  is afebrile according to the nurse.  GENERAL:  She is awake and alert and eating her lunch.  NECK:  Supple.  LUNGS:  Clear to auscultation bilaterally.  HEART:  Regular rhythm around 80.  PMI is palpable in the left chest.  ABDOMEN:  Nondistended, bowel sounds are present.  She has minimal  tenderness to deep palpation in the right upper quadrant with no guarding.  No masses or tenderness are noted.  SKIN:  Warm and dry with no rashes.  EXTREMITIES:  Some minimal peripheral edema.   LABORATORY DATA:  White blood cell count of 8.9, hemoglobin 10.  Amylase 35,  lipase 27.   IMPRESSION:  1. Cholecystitis and biliary pancreatitis nearly resolved.  2. Severe coronary artery disease with acute myocardial infarction.   RECOMMENDATIONS:  1. Intravenous antibiotics.  I will write for Unasyn.  2. Continue low fat diet.  3. If her abdominal pain worsens or nausea and vomiting symptoms return, I     would recommend proceeding with percutaneous cholecystostomy by     radiology.  4. I would proceed with further evaluation for coronary artery bypass     grafting.  5. We will plan cholecystectomy once she is stable from a cardiac standpoint     or as an outpatient when she recovers.   I will follow her along with you.  Thank you very much for this consult.                                               Merri Ray Grandville Silos, M.D.    BET/MEDQ  D:  06/13/2003  T:  06/14/2003  Job:  [id number]

## 2010-11-08 NOTE — Procedures (Signed)
NAME:  Alexandra [name], Alexandra [name]                ACCOUNT NO.:  [id number]   MEDICAL RECORD NO.:  [id number]          PATIENT TYPE:  INP   LOCATION:  A339                          FACILITY:  [facility]   PHYSICIAN:  Edward L. Luan Pulling, M.D.DATE OF BIRTH:  1921-11-19   DATE OF PROCEDURE:  08/14/2005  DATE OF DISCHARGE:  08/16/2005                                EKG INTERPRETATION   The rhythm is probably a pacemaker with a bipolar pacemaker.  Abnormal  electrocardiogram.      Jasper Loser. Luan Pulling, M.D.  Electronically Signed     ELH/MEDQ  D:  08/20/2005  T:  08/21/2005  Job:  [id number]

## 2010-12-02 ENCOUNTER — Telehealth: Payer: Self-pay | Admitting: Cardiology

## 2010-12-02 NOTE — Telephone Encounter (Signed)
Spoke with daughter Alexandra Hall and she says that since April office visit and increase of Lisinopril to 40mg  has had a cough(no cough on 20mg ) BP-- 126/70 with HR 81.  Feels like she has pulled something in her ribs, not able to sleep.  Has been using cough drops(dry,hacking cough) not helping.  This started one week after increase and has progressively gotten worse.  The daughter is going to cut the dose in 1/2 to 20mg  and keep a log of BP's for [place]  Wants to know if there is anything else she can take?

## 2010-12-02 NOTE — Telephone Encounter (Signed)
Re medication that was increased

## 2010-12-04 NOTE — Telephone Encounter (Signed)
Pt's dtr calling again re medication causing coughing and wanting to know if anything else she can take? [phone] sheila corum

## 2010-12-05 NOTE — Telephone Encounter (Signed)
Discussed with sally putt, pharm md, pt can change to losartan 50mg  once daily. She will need to monitor her bp because it may need to be increased to 100mg  daily. Left message for dtr to call [name]

## 2010-12-07 ENCOUNTER — Emergency Department (HOSPITAL_COMMUNITY): Payer: Medicare Other

## 2010-12-07 ENCOUNTER — Emergency Department (HOSPITAL_COMMUNITY)
Admission: EM | Admit: 2010-12-07 | Discharge: 2010-12-07 | Disposition: A | Discharge status: Home / Self Care | Payer: Medicare Other | Attending: Emergency Medicine | Admitting: Emergency Medicine

## 2010-12-07 DIAGNOSIS — IMO0002 Reserved for concepts with insufficient information to code with codable children: Secondary | ICD-10-CM | POA: Insufficient documentation

## 2010-12-07 DIAGNOSIS — Z79899 Other long term (current) drug therapy: Secondary | ICD-10-CM | POA: Insufficient documentation

## 2010-12-07 DIAGNOSIS — I10 Essential (primary) hypertension: Secondary | ICD-10-CM | POA: Insufficient documentation

## 2010-12-07 DIAGNOSIS — Z7982 Long term (current) use of aspirin: Secondary | ICD-10-CM | POA: Insufficient documentation

## 2010-12-07 DIAGNOSIS — E785 Hyperlipidemia, unspecified: Secondary | ICD-10-CM | POA: Insufficient documentation

## 2010-12-07 DIAGNOSIS — E039 Hypothyroidism, unspecified: Secondary | ICD-10-CM | POA: Insufficient documentation

## 2010-12-07 DIAGNOSIS — M810 Age-related osteoporosis without current pathological fracture: Secondary | ICD-10-CM | POA: Insufficient documentation

## 2010-12-07 DIAGNOSIS — I252 Old myocardial infarction: Secondary | ICD-10-CM | POA: Insufficient documentation

## 2010-12-07 DIAGNOSIS — J069 Acute upper respiratory infection, unspecified: Secondary | ICD-10-CM | POA: Insufficient documentation

## 2010-12-10 NOTE — Telephone Encounter (Signed)
Spoke with pt dtr, she reports she was seen by her primary md and he felt she had a URI. She was given antibiotics, steroids and inhalers. Then she went to the hosp and had a cxr and they gave her cough syrup. They also reduced her lisinopril back to 20mg . Her bp is staying below 140's by her dtr report. She does not want to make any changes at this time. She will cont to track the bp and let us know of any peroblems [name]

## 2010-12-23 ENCOUNTER — Other Ambulatory Visit: Payer: Self-pay | Admitting: Family Medicine

## 2010-12-23 ENCOUNTER — Ambulatory Visit (HOSPITAL_COMMUNITY)
Admission: RE | Admit: 2010-12-23 | Discharge: 2010-12-23 | Disposition: A | Discharge status: Home / Self Care | Payer: Medicare Other | Source: Ambulatory Visit | Attending: Family Medicine | Admitting: Family Medicine

## 2010-12-23 DIAGNOSIS — R0989 Other specified symptoms and signs involving the circulatory and respiratory systems: Secondary | ICD-10-CM | POA: Insufficient documentation

## 2010-12-23 DIAGNOSIS — R0609 Other forms of dyspnea: Secondary | ICD-10-CM | POA: Insufficient documentation

## 2010-12-23 DIAGNOSIS — Z951 Presence of aortocoronary bypass graft: Secondary | ICD-10-CM | POA: Insufficient documentation

## 2010-12-23 DIAGNOSIS — I10 Essential (primary) hypertension: Secondary | ICD-10-CM | POA: Insufficient documentation

## 2010-12-23 DIAGNOSIS — Z950 Presence of cardiac pacemaker: Secondary | ICD-10-CM | POA: Insufficient documentation

## 2010-12-26 ENCOUNTER — Encounter: Payer: Self-pay | Admitting: Cardiology

## 2010-12-26 ENCOUNTER — Ambulatory Visit (INDEPENDENT_AMBULATORY_CARE_PROVIDER_SITE_OTHER): Payer: Medicare Other | Admitting: Cardiology

## 2010-12-26 ENCOUNTER — Ambulatory Visit (INDEPENDENT_AMBULATORY_CARE_PROVIDER_SITE_OTHER): Payer: Medicare Other | Admitting: *Deleted

## 2010-12-26 ENCOUNTER — Other Ambulatory Visit: Payer: Self-pay | Admitting: Internal Medicine

## 2010-12-26 DIAGNOSIS — I4891 Unspecified atrial fibrillation: Secondary | ICD-10-CM

## 2010-12-26 DIAGNOSIS — I079 Rheumatic tricuspid valve disease, unspecified: Secondary | ICD-10-CM

## 2010-12-26 DIAGNOSIS — Z8679 Personal history of other diseases of the circulatory system: Secondary | ICD-10-CM

## 2010-12-26 DIAGNOSIS — R0600 Dyspnea, unspecified: Secondary | ICD-10-CM

## 2010-12-26 DIAGNOSIS — Z954 Presence of other heart-valve replacement: Secondary | ICD-10-CM

## 2010-12-26 DIAGNOSIS — R0609 Other forms of dyspnea: Secondary | ICD-10-CM

## 2010-12-26 DIAGNOSIS — Z950 Presence of cardiac pacemaker: Secondary | ICD-10-CM

## 2010-12-26 DIAGNOSIS — I701 Atherosclerosis of renal artery: Secondary | ICD-10-CM

## 2010-12-26 DIAGNOSIS — I2510 Atherosclerotic heart disease of native coronary artery without angina pectoris: Secondary | ICD-10-CM

## 2010-12-26 DIAGNOSIS — I495 Sick sinus syndrome: Secondary | ICD-10-CM

## 2010-12-26 DIAGNOSIS — E785 Hyperlipidemia, unspecified: Secondary | ICD-10-CM

## 2010-12-26 DIAGNOSIS — R0602 Shortness of breath: Secondary | ICD-10-CM

## 2010-12-26 LAB — PACEMAKER DEVICE OBSERVATION
BATTERY VOLTAGE: 2.64 V
BATTERY VOLTAGE: 2.64 v
BMOD-0003RV: 30
BMOD-0003RV: 30
BMOD-0005RV: 95 {beats}/min
BPAC-0002RV: 5 V
BPAC-0003RV: 1 ms
BRDY-0002RV: 65 {beats}/min
BRDY-0002RV: 65 {beats}/min
BSEN-0004RV: 230 ms
DEV-0004LDO: 5076
DEV-0006LDO: 20050110
DEV-0006PM: 20050110
DEV-0023LDO: 0
DEV-0024PM: 85
RV LEAD IMPEDENCE PM: 588 Ohm
RV LEAD IMPEDENCE PM: 588 Ohm
TEL-0014: 3.394 Ohm
VENTRICULAR PACING PM: 6
VENTRICULAR PACING PM: 6

## 2010-12-26 NOTE — Assessment & Plan Note (Signed)
Patient had worsening congestive heart failure symptoms. There was mild improvement with increasing diuretics but her renal function deteriorated and those medications have now been held. I will plan to repeat an echocardiogram for LV function. Her electrocardiogram shows sinus rhythm with ventricular pacing and interrogation of her device shows ERI. I reviewed this with Dr. Rayann Heman today. I think that all of her atrial kick may be contributing to her CHF. She has been scheduled to see Dr. Lovena Le on July 10. He will arrange to have her generator changed and then reprogrammed her pacemaker. Hopefully this will improve her symptoms.

## 2010-12-26 NOTE — Assessment & Plan Note (Signed)
Continue present blood pressure medications. 

## 2010-12-26 NOTE — Assessment & Plan Note (Signed)
Continued aspirin and statin.

## 2010-12-26 NOTE — Assessment & Plan Note (Signed)
Continued SBE prophylaxis. Followup echo.

## 2010-12-26 NOTE — Progress Notes (Signed)
HPI: Alexandra Hall is a very pleasant female who I have seen in the past for coronary artery disease status post coronary bypass graft, history of aortic valve replacement, as well as tricuspid valve repair. This was performed in December of 2004. Most recent Myoview was performed in July 2010. At that time her ejection fraction was 64% and there was no ischemia or infarction. Last echocardiogram was performed in May of 2011. At that time her LV function was normal. The prosthetic aortic valve was functioning appropriately with no AI; mean gradient of 22 mm of mercury; no TR. She also has a history of pacemaker. Renal Dopplers in May of 2012 revealed very mild 1-59% right renal stenosis and severe stenosis of iliacs. I last saw her in April of 2012. Since then I was contacted by her primary care physician and there was note of increased dyspnea and edema. BNP 6066; chest x-ray with enlargement of cardiac silhouette and emphysematous and bronchitic changes. Lasix added. F/U BUN and creatinine were elevated at 46 and 2.68 respectively. Because of the elevation in her BUN and creatinine her diuretics and ACE inhibitor were held. She continues to have some dyspnea on exertion but improved. There is also fatigued. No chest pain, palpitations or syncope.   Current Outpatient Prescriptions  Medication Sig Dispense Refill  . aspirin 81 MG tablet Take 81 mg by mouth daily.        Marland Kitchen CALCIUM-VITAMIN D PO Take 1 tablet by mouth daily.        . diphenhydrAMINE (BENADRYL) 25 mg capsule Take 25 mg by mouth at bedtime as needed.        . ezetimibe-simvastatin (VYTORIN) 10-40 MG per tablet Take 1 tablet by mouth at bedtime.        . fexofenadine (ALLEGRA) 180 MG tablet Take 180 mg by mouth daily.        Marland Kitchen ibandronate (BONIVA) 150 MG tablet Take 150 mg by mouth every 30 (thirty) days. Take in the morning with a full glass of water, on an empty stomach, and do not take anything else by mouth or lie down for the next 30 min.        Marland Kitchen levothyroxine (SYNTHROID, LEVOTHROID) 88 MCG tablet Take 88 mcg by mouth daily.        . metoprolol tartrate (LOPRESSOR) 25 MG tablet Take 25 mg by mouth 2 (two) times daily.        . niacin (NIASPAN) 1000 MG CR tablet Take 1,000 mg by mouth at bedtime.        . potassium chloride (K-DUR,KLOR-CON) 10 MEQ tablet Take 1 tablet by mouth Daily.      . vitamin B-12 (CYANOCOBALAMIN) 1000 MCG tablet Take 1,000 mcg by mouth daily.        . chlorthalidone (HYGROTON) 25 MG tablet Take 25 mg by mouth daily.        . furosemide (LASIX) 20 MG tablet Take 20 mg by mouth daily.        Marland Kitchen lisinopril (PRINIVIL,ZESTRIL) 20 MG tablet Take 20 mg by mouth daily.        Marland Kitchen DISCONTD: azithromycin (ZITHROMAX) 250 MG tablet Take 2 tablets by mouth on day 1, followed by 1 tablet by mouth daily for 4 days.      Marland Kitchen DISCONTD: lisinopril (PRINIVIL,ZESTRIL) 40 MG tablet Take 1 tablet (40 mg total) by mouth daily.  30 tablet  12     Past Medical History  Diagnosis Date  . CAD (coronary artery  disease)   . Hypothyroidism   . Renal insufficiency   . Colon cancer   . HTN (hypertension)   . HLD (hyperlipidemia)   . GERD (gastroesophageal reflux disease)   . Osteoporosis   . Breast cancer   . Sinus node dysfunction   . Renal artery stenosis     Past Surgical History  Procedure Date  . Coronary artery bypass graft     12/04  . Aortic valve replacement     12/04  . Tricuspid valve repair     12/04  . Pacemaker insertion     History   Social History  . Marital Status: Widowed    Spouse Name: N/A    Number of Children: N/A  . Years of Education: N/A   Occupational History  . Not on file.   Social History Main Topics  . Smoking status: Never Smoker   . Smokeless tobacco: Not on file  . Alcohol Use: No  . Drug Use: Not on file  . Sexually Active: Not on file   Other Topics Concern  . Not on file   Social History Narrative  . No narrative on file    ROS: no fevers or chills, productive cough,  hemoptysis, dysphasia, odynophagia, melena, hematochezia, dysuria, hematuria, rash, seizure activity, orthopnea, PND, pedal edema, claudication. Remaining systems are negative.  Physical Exam: Well-developed well-nourished in no acute distress.  Skin is warm and dry.  HEENT is normal.  Neck is supple. No thyromegaly.  Chest is clear to auscultation with normal expansion.  Cardiovascular exam is regular rate and rhythm.  Abdominal exam nontender or distended. No masses palpated. Extremities show no edema. neuro grossly intact  ECG Sinus with ventricular pacing; inferolateral Twave changes.

## 2010-12-26 NOTE — Progress Notes (Signed)
Pacer check

## 2010-12-26 NOTE — Assessment & Plan Note (Signed)
Fu renal dopplers one year.

## 2010-12-26 NOTE — Assessment & Plan Note (Signed)
Continue statin. 

## 2010-12-26 NOTE — Assessment & Plan Note (Signed)
Schedule generator changed as outlined.

## 2010-12-26 NOTE — Patient Instructions (Addendum)
APPOINTMENT WITH DR Cristopher Peru Tuesday 12-31-10 @ 8:45AM  Your physician has requested that you have an echocardiogram. Echocardiography is a painless test that uses sound waves to create images of your heart. It provides your doctor with information about the size and shape of your heart and how well your heart's chambers and valves are working. This procedure takes approximately one hour. There are no restrictions for this procedure.   Your physician recommends that you schedule a follow-up appointment in: 4 WEEKS

## 2010-12-26 NOTE — Assessment & Plan Note (Signed)
Continued SBE prophylaxis. Repeat echocardiogram.

## 2010-12-30 ENCOUNTER — Encounter: Payer: Self-pay | Admitting: Cardiology

## 2010-12-31 ENCOUNTER — Ambulatory Visit (INDEPENDENT_AMBULATORY_CARE_PROVIDER_SITE_OTHER): Payer: Medicare Other | Admitting: Internal Medicine

## 2010-12-31 ENCOUNTER — Encounter: Payer: Self-pay | Admitting: Internal Medicine

## 2010-12-31 ENCOUNTER — Ambulatory Visit (HOSPITAL_BASED_OUTPATIENT_CLINIC_OR_DEPARTMENT_OTHER): Payer: Medicare Other | Admitting: Radiology

## 2010-12-31 ENCOUNTER — Encounter: Payer: Self-pay | Admitting: *Deleted

## 2010-12-31 DIAGNOSIS — I442 Atrioventricular block, complete: Secondary | ICD-10-CM

## 2010-12-31 DIAGNOSIS — T82190A Other mechanical complication of cardiac electrode, initial encounter: Secondary | ICD-10-CM

## 2010-12-31 DIAGNOSIS — I080 Rheumatic disorders of both mitral and aortic valves: Secondary | ICD-10-CM | POA: Insufficient documentation

## 2010-12-31 DIAGNOSIS — Z954 Presence of other heart-valve replacement: Secondary | ICD-10-CM | POA: Insufficient documentation

## 2010-12-31 DIAGNOSIS — I079 Rheumatic tricuspid valve disease, unspecified: Secondary | ICD-10-CM | POA: Insufficient documentation

## 2010-12-31 DIAGNOSIS — R0600 Dyspnea, unspecified: Secondary | ICD-10-CM

## 2010-12-31 DIAGNOSIS — I2510 Atherosclerotic heart disease of native coronary artery without angina pectoris: Secondary | ICD-10-CM

## 2010-12-31 DIAGNOSIS — I4891 Unspecified atrial fibrillation: Secondary | ICD-10-CM

## 2010-12-31 DIAGNOSIS — I359 Nonrheumatic aortic valve disorder, unspecified: Secondary | ICD-10-CM

## 2010-12-31 DIAGNOSIS — I10 Essential (primary) hypertension: Secondary | ICD-10-CM

## 2010-12-31 DIAGNOSIS — E785 Hyperlipidemia, unspecified: Secondary | ICD-10-CM | POA: Insufficient documentation

## 2010-12-31 LAB — BASIC METABOLIC PANEL
BUN: 34 mg/dL — ABNORMAL HIGH (ref 6–23)
Creatinine, Ser: 1.9 mg/dL — ABNORMAL HIGH (ref 0.4–1.2)
GFR: 26.1 mL/min — ABNORMAL LOW (ref >60.00)
Glucose, Bld: 101 mg/dL — ABNORMAL HIGH (ref 70–99)
Potassium: 4.9 mEq/L (ref 3.5–5.1)

## 2010-12-31 LAB — CBC WITH DIFFERENTIAL/PLATELET
Basophils Absolute: 0 10*3/uL (ref 0.0–0.1)
HCT: 35 % — ABNORMAL LOW (ref 36.0–46.0)
Lymphs Abs: 1.4 10*3/uL (ref 0.7–4.0)
Monocytes Absolute: 0.5 10*3/uL (ref 0.1–1.0)
Monocytes Relative: 9.3 % (ref 3.0–12.0)
Platelets: 106 10*3/uL — ABNORMAL LOW (ref 150.0–400.0)
RDW: 16 % — ABNORMAL HIGH (ref 11.5–14.6)

## 2010-12-31 NOTE — Assessment & Plan Note (Signed)
She denies anginal symptoms. Continue current medications.

## 2010-12-31 NOTE — Patient Instructions (Signed)
Your physician has recommended that you have a pacemaker battery change out.

## 2010-12-31 NOTE — Progress Notes (Signed)
HPI  Allergies  Allergen Reactions  . Propoxyphene N-Acetaminophen      Current Outpatient Prescriptions  Medication Sig Dispense Refill  . aspirin 81 MG tablet Take 81 mg by mouth daily.        Marland Kitchen CALCIUM-VITAMIN D PO Take 1 tablet by mouth daily.        . diphenhydrAMINE (BENADRYL) 25 mg capsule Take 25 mg by mouth at bedtime as needed.        . ezetimibe-simvastatin (VYTORIN) 10-40 MG per tablet Take 1 tablet by mouth at bedtime.        . fexofenadine (ALLEGRA) 180 MG tablet Take 180 mg by mouth daily.        Marland Kitchen ibandronate (BONIVA) 150 MG tablet Take 150 mg by mouth every 30 (thirty) days. Take in the morning with a full glass of water, on an empty stomach, and do not take anything else by mouth or lie down for the next 30 min.       Marland Kitchen levothyroxine (SYNTHROID, LEVOTHROID) 88 MCG tablet Take 88 mcg by mouth daily.        . metoprolol tartrate (LOPRESSOR) 25 MG tablet Take 25 mg by mouth 2 (two) times daily.        . niacin (NIASPAN) 1000 MG CR tablet Take 1,000 mg by mouth at bedtime.        . potassium chloride (K-DUR,KLOR-CON) 10 MEQ tablet Take 1 tablet by mouth Daily.      . vitamin B-12 (CYANOCOBALAMIN) 1000 MCG tablet Take 1,000 mcg by mouth daily.        Marland Kitchen DISCONTD: chlorthalidone (HYGROTON) 25 MG tablet Take 25 mg by mouth daily.        Marland Kitchen DISCONTD: furosemide (LASIX) 20 MG tablet Take 20 mg by mouth daily.        Marland Kitchen DISCONTD: lisinopril (PRINIVIL,ZESTRIL) 20 MG tablet Take 20 mg by mouth daily.           Past Medical History  Diagnosis Date  . CAD (coronary artery disease)   . Hypothyroidism   . Renal insufficiency   . Colon cancer   . HTN (hypertension)   . HLD (hyperlipidemia)   . GERD (gastroesophageal reflux disease)   . Osteoporosis   . Breast cancer   . Sinus node dysfunction   . Renal artery stenosis     ROS:   All systems reviewed and negative except as noted in the HPI.   Past Surgical History  Procedure Date  . Coronary artery bypass graft    12/04  . Aortic valve replacement     12/04  . Tricuspid valve repair     12/04  . Pacemaker insertion      Family History  Problem Relation Age of Onset  . Coronary artery disease       History   Social History  . Marital Status: Widowed    Spouse Name: N/A    Number of Children: N/A  . Years of Education: N/A   Occupational History  . Not on file.   Social History Main Topics  . Smoking status: Former Smoker    Quit date: 12/30/1965  . Smokeless tobacco: Never Used  . Alcohol Use: No  . Drug Use: Not on file  . Sexually Active: Not on file   Other Topics Concern  . Not on file   Social History Narrative  . No narrative on file     BP 142/64  Pulse 54  Ht 5\' 5"  (  1.651 m)  Wt 143 lb (64.864 kg)  BMI 23.80 kg/m2  Physical Exam:  Well appearing NAD HEENT: Unremarkable Neck:  No JVD, no thyromegally Lymphatics:  No adenopathy Back:  No CVA tenderness Lungs:  Clear HEART:  Regular rate rhythm, no murmurs, no rubs, no clicks Abd:  Flat, positive bowel sounds, no organomegally, no rebound, no guarding Ext:  2 plus pulses, no edema, no cyanosis, no clubbing Skin:  No rashes no nodules Neuro:  CN II through XII intact, motor grossly intact  EKG  DEVICE  Normal device function.  See PaceArt for details.   Assess/Plan:

## 2010-12-31 NOTE — Assessment & Plan Note (Signed)
Her device has reached elective replacement and reverted back to VVI pacing. I will schedule pacemaker generator change at the earliest convenient time. 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

## 2010-12-31 NOTE — Assessment & Plan Note (Signed)
Her blood pressure today is slightly elevated. We'll adjust her medications as required.

## 2011-01-01 ENCOUNTER — Ambulatory Visit (HOSPITAL_COMMUNITY)
Admission: RE | Admit: 2011-01-01 | Discharge: 2011-01-01 | Disposition: A | Discharge status: Home / Self Care | Payer: Medicare Other | Source: Ambulatory Visit | Attending: Internal Medicine | Admitting: Internal Medicine

## 2011-01-01 DIAGNOSIS — Z45018 Encounter for adjustment and management of other part of cardiac pacemaker: Secondary | ICD-10-CM | POA: Insufficient documentation

## 2011-01-01 DIAGNOSIS — I498 Other specified cardiac arrhythmias: Secondary | ICD-10-CM

## 2011-01-09 ENCOUNTER — Encounter: Payer: Self-pay | Admitting: *Deleted

## 2011-01-09 NOTE — Op Note (Signed)
  Alexandra Hall, Alexandra Hall NO.:  [id number]  MEDICAL RECORD NO.:  [id number]  LOCATION:  [facility]                         FACILITY:  [facility]  PHYSICIAN:  Champ Mungo. Lovena Le, MD    DATE OF BIRTH:  1921/07/23  DATE OF PROCEDURE:  01/01/2011 DATE OF DISCHARGE:  01/01/2011                              OPERATIVE REPORT   PROCEDURE PERFORMED:  Removal of a previously implanted dual-chamber pacemaker which had reached elective replacement followed by insertion of new dual-chamber pacemaker.  INDICATION:  Symptomatic bradycardia with the device at ERI.  INTRODUCTION:  The patient is an 77 year old woman with longstanding bradycardia and heart block status post permanent pacemaker insertion. She reached elective replacement indication several weeks ago and is feeling poorly, as her device had reverted to the VVI mode.  She is now referred for removal of her previously implanted dual-chamber pacemaker and insertion of a new dual-chamber device.  PROCEDURE IN DETAIL:  After informed consent was obtained, the patient was taken to the diagnostic EP lab in a fasting state.  After usual preparation and draping, intravenous fentanyl and midazolam was given for sedation.  A 30 mL of lidocaine was infiltrated into the left infraclavicular region.  A 5-cm incision was carried out over this region and electrocautery was utilized to dissect down to the fascial plane.  Pacemaker pocket was entered with electrocautery and removed with gentle traction.  The atrial and ventricular leads were evaluated and found be working satisfactorily.  The pocket was irrigated with antibiotic irrigation and electrocautery was utilized to assure hemostasis.  The Medtronic Cynthia dual-chamber pacemaker serial number [id number] was connected to the atrial and ventricular leads and placed back in the subcutaneous pocket.  The pocket was irrigated with antibiotic irrigation and the incision closed with 2-0 and  3-0 Vicryl. Benzoin and Steri-Strips were painted on the skin, pressure dressing was applied, the patient was returned to her room in satisfactory condition.  COMPLICATIONS:  There were no immediate procedure complications.  RESULTS:  This demonstrates successful removal of a previously implanted dual-chamber pacemaker and insertion of a new dual-chamber device in a patient with symptomatic bradycardia.     Champ Mungo. Lovena Le, MD     GWT/MEDQ  D:  01/01/2011  T:  01/02/2011  Job:  [id number]  Electronically Signed by Cristopher Peru MD on 01/09/2011 08:37:55 AM

## 2011-01-10 ENCOUNTER — Encounter: Payer: Self-pay | Admitting: Cardiology

## 2011-01-10 ENCOUNTER — Ambulatory Visit (INDEPENDENT_AMBULATORY_CARE_PROVIDER_SITE_OTHER): Payer: Medicare Other | Admitting: Cardiology

## 2011-01-10 DIAGNOSIS — Z950 Presence of cardiac pacemaker: Secondary | ICD-10-CM

## 2011-01-10 DIAGNOSIS — I2510 Atherosclerotic heart disease of native coronary artery without angina pectoris: Secondary | ICD-10-CM

## 2011-01-10 DIAGNOSIS — Z79899 Other long term (current) drug therapy: Secondary | ICD-10-CM

## 2011-01-10 DIAGNOSIS — I359 Nonrheumatic aortic valve disorder, unspecified: Secondary | ICD-10-CM

## 2011-01-10 DIAGNOSIS — N259 Disorder resulting from impaired renal tubular function, unspecified: Secondary | ICD-10-CM

## 2011-01-10 DIAGNOSIS — E785 Hyperlipidemia, unspecified: Secondary | ICD-10-CM

## 2011-01-10 DIAGNOSIS — Z8679 Personal history of other diseases of the circulatory system: Secondary | ICD-10-CM

## 2011-01-10 DIAGNOSIS — I701 Atherosclerosis of renal artery: Secondary | ICD-10-CM

## 2011-01-10 DIAGNOSIS — I10 Essential (primary) hypertension: Secondary | ICD-10-CM

## 2011-01-10 DIAGNOSIS — R0602 Shortness of breath: Secondary | ICD-10-CM

## 2011-01-10 DIAGNOSIS — I079 Rheumatic tricuspid valve disease, unspecified: Secondary | ICD-10-CM

## 2011-01-10 DIAGNOSIS — Z954 Presence of other heart-valve replacement: Secondary | ICD-10-CM

## 2011-01-10 LAB — BASIC METABOLIC PANEL
BUN: 23 mg/dL (ref 6–23)
Calcium: 8.5 mg/dL (ref 8.4–10.5)
Creatinine, Ser: 1.5 mg/dL — ABNORMAL HIGH (ref 0.4–1.2)
GFR: 36.02 mL/min — ABNORMAL LOW (ref >60.00)
Glucose, Bld: 104 mg/dL — ABNORMAL HIGH (ref 70–99)
Sodium: 139 mEq/L (ref 135–145)

## 2011-01-10 MED ORDER — FUROSEMIDE 20 MG PO TABS: ORAL_TABLET | ORAL | Status: DC

## 2011-01-10 NOTE — Assessment & Plan Note (Signed)
Continued SBE prophylaxis. Repeat echocardiogram in one year.

## 2011-01-10 NOTE — Patient Instructions (Signed)
Your physician recommends that you schedule a follow-up appointment in: [place] has recommended you make the following change in your medication: RESTART FUROSEMIDE 20 MG  DAILY  AS NEEDED  Your physician recommends that you return for lab work in: [place] BMET BNP  DX 424.1 401.1 V58.69  785.06

## 2011-01-10 NOTE — Assessment & Plan Note (Signed)
Repeat renal Dopplers May 2013.

## 2011-01-10 NOTE — Assessment & Plan Note (Signed)
Dyspnea, fatigue and CHF symptoms much improved after pacemaker generator change. Previous CHF most likely secondary to ventricular pacing and loss of atrial kick. Occasional mild pedal edema. She will resume Lasix 20 mg daily as needed for increased edema. Check potassium and renal function as well as BNP.

## 2011-01-10 NOTE — Progress Notes (Signed)
HPI:Alexandra Hall is a very pleasant female who I have seen in the past for coronary artery disease status post coronary bypass graft, history of aortic valve replacement, as well as tricuspid valve repair. This was performed in December of 2004. Most recent Myoview was performed in July 2010. At that time her ejection fraction was 64% and there was no ischemia or infarction. Last echocardiogram was performed in July of 2012. At that time her LV function was normal. The prosthetic aortic valve showed mild to moderate stenosis;  mild MR; moderate TR; severe LAE; mild RAE and RVE. She also has a history of pacemaker. Renal Dopplers in May of 2012 revealed very mild 1-59% right renal stenosis and severe stenosis of iliacs. I last saw her in July 2012. She had increasing heart failure symptoms. Her pacemaker was ERI. We felt that this was contributing to her congestive heart failure and she saw Dr. Lovena Le in followup. She again had her generator changed. Since that time, she feels much better. Her energy has improved. She denies dyspnea on exertion, orthopnea, PND, chest pain or syncope. Occasional mild pedal edema but improved.   Current Outpatient Prescriptions  Medication Sig Dispense Refill  . aspirin 81 MG tablet Take 81 mg by mouth daily.        Marland Kitchen CALCIUM-VITAMIN D PO Take 1 tablet by mouth daily.        . diphenhydrAMINE (BENADRYL) 25 mg capsule Take 25 mg by mouth at bedtime as needed.        . ezetimibe-simvastatin (VYTORIN) 10-40 MG per tablet Take 1 tablet by mouth at bedtime.        . fexofenadine (ALLEGRA) 180 MG tablet Take 180 mg by mouth daily.        Marland Kitchen ibandronate (BONIVA) 150 MG tablet Take 150 mg by mouth every 30 (thirty) days. Take in the morning with a full glass of water, on an empty stomach, and do not take anything else by mouth or lie down for the next 30 min.       Marland Kitchen levothyroxine (SYNTHROID, LEVOTHROID) 88 MCG tablet Take 88 mcg by mouth daily.        . metoprolol tartrate (LOPRESSOR)  25 MG tablet Take 25 mg by mouth 2 (two) times daily.        . niacin (NIASPAN) 1000 MG CR tablet Take 1,000 mg by mouth at bedtime.        . potassium chloride (K-DUR,KLOR-CON) 10 MEQ tablet Take 1 tablet by mouth Daily.      . vitamin B-12 (CYANOCOBALAMIN) 1000 MCG tablet Take 1,000 mcg by mouth daily.           Past Medical History  Diagnosis Date  . CAD (coronary artery disease)   . Hypothyroidism   . Renal insufficiency   . Colon cancer   . HTN (hypertension)   . HLD (hyperlipidemia)   . GERD (gastroesophageal reflux disease)   . Osteoporosis   . Breast cancer   . Sinus node dysfunction   . Renal artery stenosis     Past Surgical History  Procedure Date  . Coronary artery bypass graft     12/04  . Aortic valve replacement     12/04  . Tricuspid valve repair     12/04  . Pacemaker insertion     History   Social History  . Marital Status: Widowed    Spouse Name: N/A    Number of Children: N/A  . Years of Education:  N/A   Occupational History  . Not on file.   Social History Main Topics  . Smoking status: Former Smoker    Quit date: 12/30/1965  . Smokeless tobacco: Never Used  . Alcohol Use: No  . Drug Use: Not on file  . Sexually Active: Not on file   Other Topics Concern  . Not on file   Social History Narrative  . No narrative on file    ROS: no fevers or chills, productive cough, hemoptysis, dysphasia, odynophagia, melena, hematochezia, dysuria, hematuria, rash, seizure activity, orthopnea, PND, pedal edema, claudication. Remaining systems are negative.  Physical Exam: Well-developed well-nourished in no acute distress.  Skin is warm and dry.  HEENT is normal.  Neck is supple. No thyromegaly.  Chest is clear to auscultation with normal expansion. Pacemaker generator site without evidence of infection. Cardiovascular exam is regular rate and rhythm. 2/6 systolic murmur left sternal border Abdominal exam nontender or distended. No masses  palpated. Extremities show no edema. neuro grossly intact

## 2011-01-10 NOTE — Assessment & Plan Note (Signed)
If renal function back to baseline we'll resume ACE inhibitor.

## 2011-01-10 NOTE — Assessment & Plan Note (Signed)
Continued SBE prophylaxis. Followup echocardiogram one year.

## 2011-01-10 NOTE — Assessment & Plan Note (Signed)
Continue statin. 

## 2011-01-10 NOTE — Assessment & Plan Note (Signed)
Management per electrophysiology. 

## 2011-01-10 NOTE — Assessment & Plan Note (Signed)
Continue aspirin and statin. 

## 2011-01-10 NOTE — Assessment & Plan Note (Signed)
Recheck potassium and renal function. If renal function improved now that diuretics discontinued and pacemaker generator changed will resume ACE inhibitor.

## 2011-01-13 ENCOUNTER — Other Ambulatory Visit: Payer: Self-pay | Admitting: *Deleted

## 2011-01-13 DIAGNOSIS — Z79899 Other long term (current) drug therapy: Secondary | ICD-10-CM

## 2011-01-16 ENCOUNTER — Encounter: Payer: Self-pay | Admitting: Physician Assistant

## 2011-01-20 ENCOUNTER — Other Ambulatory Visit (INDEPENDENT_AMBULATORY_CARE_PROVIDER_SITE_OTHER): Payer: Medicare Other | Admitting: *Deleted

## 2011-01-20 DIAGNOSIS — Z79899 Other long term (current) drug therapy: Secondary | ICD-10-CM

## 2011-01-20 LAB — BASIC METABOLIC PANEL
BUN: 41 mg/dL — ABNORMAL HIGH (ref 6–23)
Chloride: 107 mEq/L (ref 96–112)
Glucose, Bld: 115 mg/dL — ABNORMAL HIGH (ref 70–99)
Potassium: 4 mEq/L (ref 3.5–5.1)

## 2011-01-21 ENCOUNTER — Other Ambulatory Visit: Payer: Self-pay | Admitting: Internal Medicine

## 2011-01-21 ENCOUNTER — Ambulatory Visit (INDEPENDENT_AMBULATORY_CARE_PROVIDER_SITE_OTHER): Payer: Medicare Other | Admitting: *Deleted

## 2011-01-21 ENCOUNTER — Ambulatory Visit: Payer: Medicare Other | Admitting: Cardiology

## 2011-01-21 ENCOUNTER — Ambulatory Visit (INDEPENDENT_AMBULATORY_CARE_PROVIDER_SITE_OTHER): Payer: Medicare Other | Admitting: Physician Assistant

## 2011-01-21 ENCOUNTER — Encounter: Payer: Self-pay | Admitting: Physician Assistant

## 2011-01-21 ENCOUNTER — Telehealth: Payer: Self-pay | Admitting: *Deleted

## 2011-01-21 VITALS — BP 134/70 | HR 90 | Resp 18 | Ht 64.0 in | Wt 144.8 lb

## 2011-01-21 DIAGNOSIS — R0989 Other specified symptoms and signs involving the circulatory and respiratory systems: Secondary | ICD-10-CM

## 2011-01-21 DIAGNOSIS — I495 Sick sinus syndrome: Secondary | ICD-10-CM

## 2011-01-21 DIAGNOSIS — N289 Disorder of kidney and ureter, unspecified: Secondary | ICD-10-CM

## 2011-01-21 DIAGNOSIS — Z950 Presence of cardiac pacemaker: Secondary | ICD-10-CM

## 2011-01-21 DIAGNOSIS — I10 Essential (primary) hypertension: Secondary | ICD-10-CM

## 2011-01-21 LAB — PACEMAKER DEVICE OBSERVATION
AL IMPEDENCE PM: 403 Ohm
ATRIAL PACING PM: 98
BATTERY VOLTAGE: 2.8 V
RV LEAD THRESHOLD: 0.5 V

## 2011-01-21 MED ORDER — FUROSEMIDE 20 MG PO TABS: 20.0000 mg | ORAL_TABLET | Freq: Every day | ORAL | Status: DC

## 2011-01-21 NOTE — Patient Instructions (Addendum)
Your physician recommends that you return for lab work in: [facility] BM[facility] 02/05/11 DX 414.00 YOU CAN COME IN DURING THE HOURS OF 8:30 AM - 1:30 PM OR 2:30 PM -4:30 PM   A REFILL HAS BEEN SENT IN FOR YOU TODAY FOR YOUR LASIX 20 MG 1 TABLET DAILY

## 2011-01-21 NOTE — Telephone Encounter (Signed)
Message copied by Cristopher Estimable on Tue Jan 21, 2011  4:58 PM ------      Message from: Lelon Perla      Created: Mon Jan 20, 2011  5:36 PM       Recheck bmet 2 weeks      Kirk Ruths

## 2011-01-21 NOTE — Assessment & Plan Note (Addendum)
I will have the EP nurses see her today to check her pacer as this was supposed to be her post op visit.  No charge for seeing me today.
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
## 2011-02-04 ENCOUNTER — Other Ambulatory Visit: Payer: Medicare Other | Admitting: *Deleted

## 2011-02-05 ENCOUNTER — Other Ambulatory Visit (INDEPENDENT_AMBULATORY_CARE_PROVIDER_SITE_OTHER): Payer: Medicare Other | Admitting: *Deleted

## 2011-02-05 DIAGNOSIS — N289 Disorder of kidney and ureter, unspecified: Secondary | ICD-10-CM

## 2011-02-05 LAB — BASIC METABOLIC PANEL
GFR: 27.23 mL/min — ABNORMAL LOW (ref >60.00)
Glucose, Bld: 113 mg/dL — ABNORMAL HIGH (ref 70–99)
Potassium: 3.6 mEq/L (ref 3.5–5.1)
Sodium: 144 mEq/L (ref 135–145)

## 2011-02-17 ENCOUNTER — Other Ambulatory Visit: Payer: Self-pay | Admitting: *Deleted

## 2011-02-17 ENCOUNTER — Telehealth: Payer: Self-pay | Admitting: Cardiology

## 2011-02-17 ENCOUNTER — Encounter: Payer: Medicare Other | Admitting: *Deleted

## 2011-02-17 DIAGNOSIS — Z79899 Other long term (current) drug therapy: Secondary | ICD-10-CM

## 2011-02-17 DIAGNOSIS — R609 Edema, unspecified: Secondary | ICD-10-CM

## 2011-02-17 NOTE — Telephone Encounter (Signed)
Spoke with pt dtr, pt woke yesterday with her right foot swollen up into the ankle and lower leg. She elevated her legs all day yesterday with no change. She denies SOB. She does have some edema in the left but not near as bad. The right leg hurts to walk on it. She was taken off lisinopril and chlorthalizone and is currently taking lasix 20 mg once daily. The right is the harvest leg from bypass surgery. The pts weight is up 2 lbs in 2 days. There is a dime size spot on the top of her right foot that is red and warm to the touch. They question if anything needs to be done Will forward for dr Stanford Breed review [name]

## 2011-02-17 NOTE — Telephone Encounter (Signed)
Spoke with pt dtr, they are unable to come to the office today for an ultrasound. They will come tomorrow am at 8:30a for doppler. She will increase the lasix and have labs on 02-25-11. [name]

## 2011-02-17 NOTE — Telephone Encounter (Signed)
Right foot and leg are swollen for two days, pls advise

## 2011-02-17 NOTE — Telephone Encounter (Signed)
Ultrasound right lower extremity to R/O DVT; change lasix to 40 mg daily; bmet one week. Kirk Ruths

## 2011-02-18 ENCOUNTER — Encounter: Payer: Medicare Other | Admitting: *Deleted

## 2011-02-18 ENCOUNTER — Encounter (INDEPENDENT_AMBULATORY_CARE_PROVIDER_SITE_OTHER): Payer: Medicare Other | Admitting: *Deleted

## 2011-02-18 ENCOUNTER — Ambulatory Visit (HOSPITAL_COMMUNITY)
Admission: RE | Admit: 2011-02-18 | Discharge: 2011-02-18 | Disposition: A | Discharge status: Home / Self Care | Payer: Medicare Other | Source: Ambulatory Visit | Attending: Family Medicine | Admitting: Family Medicine

## 2011-02-18 ENCOUNTER — Other Ambulatory Visit: Payer: Self-pay | Admitting: Family Medicine

## 2011-02-18 DIAGNOSIS — M7730 Calcaneal spur, unspecified foot: Secondary | ICD-10-CM | POA: Insufficient documentation

## 2011-02-18 DIAGNOSIS — M25579 Pain in unspecified ankle and joints of unspecified foot: Secondary | ICD-10-CM | POA: Insufficient documentation

## 2011-02-18 DIAGNOSIS — L0390 Cellulitis, unspecified: Secondary | ICD-10-CM

## 2011-02-18 DIAGNOSIS — M899 Disorder of bone, unspecified: Secondary | ICD-10-CM | POA: Insufficient documentation

## 2011-02-18 DIAGNOSIS — L02619 Cutaneous abscess of unspecified foot: Secondary | ICD-10-CM | POA: Insufficient documentation

## 2011-02-18 DIAGNOSIS — R609 Edema, unspecified: Secondary | ICD-10-CM

## 2011-02-18 DIAGNOSIS — M79609 Pain in unspecified limb: Secondary | ICD-10-CM

## 2011-02-18 DIAGNOSIS — M949 Disorder of cartilage, unspecified: Secondary | ICD-10-CM | POA: Insufficient documentation

## 2011-02-25 ENCOUNTER — Other Ambulatory Visit: Payer: Self-pay | Admitting: *Deleted

## 2011-02-25 ENCOUNTER — Ambulatory Visit (INDEPENDENT_AMBULATORY_CARE_PROVIDER_SITE_OTHER): Payer: Medicare Other | Admitting: *Deleted

## 2011-02-25 ENCOUNTER — Encounter: Payer: Self-pay | Admitting: Cardiology

## 2011-02-25 DIAGNOSIS — R404 Transient alteration of awareness: Secondary | ICD-10-CM

## 2011-02-25 DIAGNOSIS — R609 Edema, unspecified: Secondary | ICD-10-CM

## 2011-02-25 DIAGNOSIS — R5381 Other malaise: Secondary | ICD-10-CM

## 2011-02-25 DIAGNOSIS — Z79899 Other long term (current) drug therapy: Secondary | ICD-10-CM

## 2011-02-25 LAB — CBC WITH DIFFERENTIAL/PLATELET
Basophils Relative: 0.6 % (ref 0.0–3.0)
Eosinophils Absolute: 0.1 10*3/uL (ref 0.0–0.7)
Eosinophils Relative: 2.6 % (ref 0.0–5.0)
HCT: 32.6 % — ABNORMAL LOW (ref 36.0–46.0)
Hemoglobin: 10.6 g/dL — ABNORMAL LOW (ref 12.0–15.0)
Lymphs Abs: 1.4 10*3/uL (ref 0.7–4.0)
MCHC: 32.4 g/dL (ref 30.0–36.0)
MCV: 101.6 fl — ABNORMAL HIGH (ref 78.0–100.0)
Monocytes Absolute: 0.5 10*3/uL (ref 0.1–1.0)
Neutro Abs: 2.1 10*3/uL (ref 1.4–7.7)
Neutrophils Relative %: 51.3 % (ref 43.0–77.0)
RBC: 3.21 Mil/uL — ABNORMAL LOW (ref 3.87–5.11)

## 2011-02-25 LAB — BASIC METABOLIC PANEL
CO2: 26 mEq/L (ref 19–32)
Chloride: 96 mEq/L (ref 96–112)
Creatinine, Ser: 3.4 mg/dL — ABNORMAL HIGH (ref 0.4–1.2)
Potassium: 3.9 mEq/L (ref 3.5–5.1)

## 2011-02-28 ENCOUNTER — Telehealth: Payer: Self-pay | Admitting: Cardiology

## 2011-02-28 DIAGNOSIS — N289 Disorder of kidney and ureter, unspecified: Secondary | ICD-10-CM

## 2011-02-28 NOTE — Telephone Encounter (Signed)
They received a call yesterday pm and was unable to get it.  She thinks it was regarding her labwork.  They will be out until after lunch.  Please call her back after lunch.

## 2011-02-28 NOTE — Telephone Encounter (Signed)
Spoke with pt dtr, she is aware of lab results. She repeated med changes and understands the need for repeat labs [name]

## 2011-03-03 ENCOUNTER — Other Ambulatory Visit (INDEPENDENT_AMBULATORY_CARE_PROVIDER_SITE_OTHER): Payer: Medicare Other | Admitting: *Deleted

## 2011-03-03 DIAGNOSIS — N289 Disorder of kidney and ureter, unspecified: Secondary | ICD-10-CM

## 2011-03-03 LAB — BASIC METABOLIC PANEL
BUN: 35 mg/dL — ABNORMAL HIGH (ref 6–23)
Calcium: 9 mg/dL (ref 8.4–10.5)
Creatinine, Ser: 2.6 mg/dL — ABNORMAL HIGH (ref 0.4–1.2)
GFR: 18.75 mL/min — ABNORMAL LOW (ref >60.00)
Glucose, Bld: 135 mg/dL — ABNORMAL HIGH (ref 70–99)

## 2011-03-06 ENCOUNTER — Other Ambulatory Visit: Payer: Self-pay | Admitting: Family Medicine

## 2011-03-06 DIAGNOSIS — N289 Disorder of kidney and ureter, unspecified: Secondary | ICD-10-CM

## 2011-03-07 ENCOUNTER — Ambulatory Visit (HOSPITAL_COMMUNITY)
Admission: RE | Admit: 2011-03-07 | Discharge: 2011-03-07 | Disposition: A | Discharge status: Home / Self Care | Payer: Medicare Other | Source: Ambulatory Visit | Attending: Family Medicine | Admitting: Family Medicine

## 2011-03-07 DIAGNOSIS — N289 Disorder of kidney and ureter, unspecified: Secondary | ICD-10-CM | POA: Insufficient documentation

## 2011-03-07 DIAGNOSIS — I10 Essential (primary) hypertension: Secondary | ICD-10-CM | POA: Insufficient documentation

## 2011-03-07 DIAGNOSIS — N271 Small kidney, bilateral: Secondary | ICD-10-CM | POA: Insufficient documentation

## 2011-04-14 ENCOUNTER — Encounter: Payer: Self-pay | Admitting: Internal Medicine

## 2011-04-14 ENCOUNTER — Ambulatory Visit (INDEPENDENT_AMBULATORY_CARE_PROVIDER_SITE_OTHER): Payer: Medicare Other | Admitting: Internal Medicine

## 2011-04-14 DIAGNOSIS — Z950 Presence of cardiac pacemaker: Secondary | ICD-10-CM

## 2011-04-14 DIAGNOSIS — I498 Other specified cardiac arrhythmias: Secondary | ICD-10-CM

## 2011-04-14 DIAGNOSIS — R0602 Shortness of breath: Secondary | ICD-10-CM

## 2011-04-14 DIAGNOSIS — Z8679 Personal history of other diseases of the circulatory system: Secondary | ICD-10-CM

## 2011-04-14 LAB — PACEMAKER DEVICE OBSERVATION
AL IMPEDENCE PM: 383 Ohm
AL THRESHOLD: 0.75 V
BATTERY VOLTAGE: 2.79 V
RV LEAD IMPEDENCE PM: 503 Ohm
RV LEAD THRESHOLD: 1.5 V

## 2011-04-14 NOTE — Assessment & Plan Note (Signed)
Her device is working normally. She is atrial pacing 99%

## 2011-04-14 NOTE — Patient Instructions (Signed)
**Note De-Identified  Obfuscation** Your physician recommends that you continue on your current medications as directed. Please refer to the Current Medication list given to you today.  Your physician recommends that you schedule a follow-up appointment in: July of 2013
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
## 2011-04-14 NOTE — Assessment & Plan Note (Signed)
This is episodic and mild. I have discussed the importance of a low sodium diet. Also, she will take slntg if it continues. Ultimately she may need some lasix.

## 2011-04-14 NOTE — Assessment & Plan Note (Signed)
Her blood pressure is elevated today. She states that she takes it at home where it is much better.

## 2011-05-22 ENCOUNTER — Encounter: Payer: Self-pay | Admitting: Cardiology

## 2011-06-03 ENCOUNTER — Other Ambulatory Visit: Payer: Self-pay | Admitting: Family Medicine

## 2011-06-03 ENCOUNTER — Ambulatory Visit (HOSPITAL_COMMUNITY)
Admission: RE | Admit: 2011-06-03 | Discharge: 2011-06-03 | Disposition: A | Discharge status: Home / Self Care | Payer: Medicare Other | Source: Ambulatory Visit | Attending: Family Medicine | Admitting: Family Medicine

## 2011-06-03 DIAGNOSIS — M549 Dorsalgia, unspecified: Secondary | ICD-10-CM

## 2011-06-03 DIAGNOSIS — M546 Pain in thoracic spine: Secondary | ICD-10-CM | POA: Insufficient documentation

## 2011-06-25 ENCOUNTER — Other Ambulatory Visit: Payer: Self-pay | Admitting: Cardiology

## 2011-07-15 ENCOUNTER — Ambulatory Visit (INDEPENDENT_AMBULATORY_CARE_PROVIDER_SITE_OTHER): Payer: Medicare Other | Admitting: Cardiology

## 2011-07-15 ENCOUNTER — Encounter: Payer: Self-pay | Admitting: Cardiology

## 2011-07-15 VITALS — BP 125/71 | HR 100 | Ht 60.0 in | Wt 137.0 lb

## 2011-07-15 DIAGNOSIS — I701 Atherosclerosis of renal artery: Secondary | ICD-10-CM

## 2011-07-15 DIAGNOSIS — I2510 Atherosclerotic heart disease of native coronary artery without angina pectoris: Secondary | ICD-10-CM | POA: Diagnosis not present

## 2011-07-15 DIAGNOSIS — Z950 Presence of cardiac pacemaker: Secondary | ICD-10-CM | POA: Diagnosis not present

## 2011-07-15 DIAGNOSIS — E785 Hyperlipidemia, unspecified: Secondary | ICD-10-CM

## 2011-07-15 DIAGNOSIS — Z954 Presence of other heart-valve replacement: Secondary | ICD-10-CM

## 2011-07-15 DIAGNOSIS — N259 Disorder resulting from impaired renal tubular function, unspecified: Secondary | ICD-10-CM

## 2011-07-15 DIAGNOSIS — Z8679 Personal history of other diseases of the circulatory system: Secondary | ICD-10-CM

## 2011-07-15 NOTE — Assessment & Plan Note (Signed)
Continue statin. 
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
## 2011-07-15 NOTE — Assessment & Plan Note (Signed)
Followed by nephrology. 

## 2011-07-15 NOTE — Assessment & Plan Note (Signed)
Continue SBE prophylaxis. Repeat echocardiogram July 2013.

## 2011-07-15 NOTE — Assessment & Plan Note (Signed)
Repeat renal Dopplers July 2013.

## 2011-07-15 NOTE — Assessment & Plan Note (Signed)
Management per electrophysiology. 

## 2011-07-15 NOTE — Patient Instructions (Signed)
Your physician wants you to follow-up in: Alexandra Hall will receive a reminder letter in the mail two months in advance. If you don't receive a letter, please call our office to schedule the follow-up appointment.

## 2011-07-15 NOTE — Assessment & Plan Note (Signed)
Blood pressure controlled. Continue present medications. 

## 2011-07-15 NOTE — Assessment & Plan Note (Signed)
Continue aspirin and statin. 

## 2011-07-24 DIAGNOSIS — N184 Chronic kidney disease, stage 4 (severe): Secondary | ICD-10-CM | POA: Diagnosis not present

## 2011-07-24 DIAGNOSIS — I129 Hypertensive chronic kidney disease with stage 1 through stage 4 chronic kidney disease, or unspecified chronic kidney disease: Secondary | ICD-10-CM | POA: Diagnosis not present

## 2011-07-24 DIAGNOSIS — N2581 Secondary hyperparathyroidism of renal origin: Secondary | ICD-10-CM | POA: Diagnosis not present

## 2011-10-15 DIAGNOSIS — I10 Essential (primary) hypertension: Secondary | ICD-10-CM | POA: Diagnosis not present

## 2011-10-15 DIAGNOSIS — D649 Anemia, unspecified: Secondary | ICD-10-CM | POA: Diagnosis not present

## 2011-10-15 DIAGNOSIS — E213 Hyperparathyroidism, unspecified: Secondary | ICD-10-CM | POA: Diagnosis not present

## 2011-10-15 DIAGNOSIS — N2581 Secondary hyperparathyroidism of renal origin: Secondary | ICD-10-CM | POA: Diagnosis not present

## 2011-10-15 DIAGNOSIS — I129 Hypertensive chronic kidney disease with stage 1 through stage 4 chronic kidney disease, or unspecified chronic kidney disease: Secondary | ICD-10-CM | POA: Diagnosis not present

## 2011-10-15 DIAGNOSIS — N184 Chronic kidney disease, stage 4 (severe): Secondary | ICD-10-CM | POA: Diagnosis not present

## 2011-10-20 DIAGNOSIS — D649 Anemia, unspecified: Secondary | ICD-10-CM | POA: Diagnosis not present

## 2011-10-20 DIAGNOSIS — N184 Chronic kidney disease, stage 4 (severe): Secondary | ICD-10-CM | POA: Diagnosis not present

## 2012-01-01 ENCOUNTER — Encounter: Payer: Medicare Other | Admitting: Internal Medicine

## 2012-01-09 ENCOUNTER — Ambulatory Visit (HOSPITAL_COMMUNITY)
Admission: RE | Admit: 2012-01-09 | Discharge: 2012-01-09 | Disposition: A | Discharge status: Home / Self Care | Payer: Medicare Other | Source: Ambulatory Visit | Attending: Family Medicine | Admitting: Family Medicine

## 2012-01-09 ENCOUNTER — Other Ambulatory Visit: Payer: Self-pay | Admitting: Family Medicine

## 2012-01-09 DIAGNOSIS — E785 Hyperlipidemia, unspecified: Secondary | ICD-10-CM | POA: Diagnosis not present

## 2012-01-09 DIAGNOSIS — N289 Disorder of kidney and ureter, unspecified: Secondary | ICD-10-CM | POA: Diagnosis not present

## 2012-01-09 DIAGNOSIS — M899 Disorder of bone, unspecified: Secondary | ICD-10-CM | POA: Insufficient documentation

## 2012-01-09 DIAGNOSIS — M25579 Pain in unspecified ankle and joints of unspecified foot: Secondary | ICD-10-CM | POA: Diagnosis not present

## 2012-01-09 DIAGNOSIS — E039 Hypothyroidism, unspecified: Secondary | ICD-10-CM | POA: Diagnosis not present

## 2012-01-09 DIAGNOSIS — M949 Disorder of cartilage, unspecified: Secondary | ICD-10-CM | POA: Diagnosis not present

## 2012-01-22 ENCOUNTER — Encounter: Payer: Self-pay | Admitting: Internal Medicine

## 2012-01-22 ENCOUNTER — Ambulatory Visit (INDEPENDENT_AMBULATORY_CARE_PROVIDER_SITE_OTHER): Payer: Medicare Other | Admitting: Internal Medicine

## 2012-01-22 VITALS — BP 138/82 | HR 89 | Ht 63.0 in | Wt 145.8 lb

## 2012-01-22 DIAGNOSIS — Z950 Presence of cardiac pacemaker: Secondary | ICD-10-CM

## 2012-01-22 DIAGNOSIS — Z8679 Personal history of other diseases of the circulatory system: Secondary | ICD-10-CM | POA: Diagnosis not present

## 2012-01-22 DIAGNOSIS — E039 Hypothyroidism, unspecified: Secondary | ICD-10-CM | POA: Diagnosis not present

## 2012-01-22 DIAGNOSIS — I2510 Atherosclerotic heart disease of native coronary artery without angina pectoris: Secondary | ICD-10-CM | POA: Diagnosis not present

## 2012-01-22 DIAGNOSIS — E782 Mixed hyperlipidemia: Secondary | ICD-10-CM | POA: Diagnosis not present

## 2012-01-22 DIAGNOSIS — I459 Conduction disorder, unspecified: Secondary | ICD-10-CM

## 2012-01-22 DIAGNOSIS — Z79899 Other long term (current) drug therapy: Secondary | ICD-10-CM | POA: Diagnosis not present

## 2012-01-22 LAB — PACEMAKER DEVICE OBSERVATION
AL THRESHOLD: 0.75 V
BATTERY VOLTAGE: 2.78 V
RV LEAD AMPLITUDE: 15.67 mv
RV LEAD IMPEDENCE PM: 636 Ohm

## 2012-01-22 NOTE — Assessment & Plan Note (Signed)
Her Medtronic dual-chamber pacemaker is working normally. Pacing and sensing are appropriate.
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
## 2012-01-22 NOTE — Assessment & Plan Note (Signed)
Her blood pressure is minimally elevated today. We discussed the importance of reducing her salt intake. I explained the difference between low-salt and low-fat. The patient notes that she has [place] bacon and [place] hot dogs I explained that both of these have too much salt despite being lower in fat.

## 2012-01-22 NOTE — Assessment & Plan Note (Signed)
She denies anginal symptoms and remains active. No change in medical therapy today.

## 2012-01-22 NOTE — Patient Instructions (Addendum)
Your physician wants you to follow-up in: 1 year  You will receive a reminder letter in the mail two months in advance. If you don't receive a letter, please call our office to schedule the follow-up appointment.  Your physician recommends that you continue on your current medications as directed. Please refer to the Current Medication list given to you today.  

## 2012-01-26 ENCOUNTER — Encounter: Payer: Self-pay | Admitting: Internal Medicine

## 2012-01-28 ENCOUNTER — Other Ambulatory Visit: Payer: Self-pay | Admitting: *Deleted

## 2012-01-28 MED ORDER — FUROSEMIDE 20 MG PO TABS: 20.0000 mg | ORAL_TABLET | Freq: Every day | ORAL | Status: DC

## 2012-02-06 ENCOUNTER — Other Ambulatory Visit (HOSPITAL_COMMUNITY): Payer: Self-pay | Admitting: Cardiology

## 2012-02-06 DIAGNOSIS — Z952 Presence of prosthetic heart valve: Secondary | ICD-10-CM

## 2012-02-10 ENCOUNTER — Ambulatory Visit (HOSPITAL_COMMUNITY): Payer: Medicare Other | Attending: Cardiology

## 2012-02-10 DIAGNOSIS — I2510 Atherosclerotic heart disease of native coronary artery without angina pectoris: Secondary | ICD-10-CM | POA: Insufficient documentation

## 2012-02-10 DIAGNOSIS — Z87891 Personal history of nicotine dependence: Secondary | ICD-10-CM | POA: Insufficient documentation

## 2012-02-10 DIAGNOSIS — Z954 Presence of other heart-valve replacement: Secondary | ICD-10-CM | POA: Diagnosis not present

## 2012-02-10 DIAGNOSIS — I080 Rheumatic disorders of both mitral and aortic valves: Secondary | ICD-10-CM | POA: Insufficient documentation

## 2012-02-10 DIAGNOSIS — I10 Essential (primary) hypertension: Secondary | ICD-10-CM | POA: Insufficient documentation

## 2012-02-10 DIAGNOSIS — I359 Nonrheumatic aortic valve disorder, unspecified: Secondary | ICD-10-CM

## 2012-02-10 DIAGNOSIS — Z952 Presence of prosthetic heart valve: Secondary | ICD-10-CM

## 2012-02-10 NOTE — Progress Notes (Signed)
Echocardiogram performed.  

## 2012-03-29 ENCOUNTER — Ambulatory Visit (INDEPENDENT_AMBULATORY_CARE_PROVIDER_SITE_OTHER): Payer: Medicare Other | Admitting: Cardiology

## 2012-03-29 ENCOUNTER — Encounter: Payer: Self-pay | Admitting: Cardiology

## 2012-03-29 VITALS — BP 116/64 | HR 97 | Ht 63.0 in | Wt 147.8 lb

## 2012-03-29 DIAGNOSIS — Z8679 Personal history of other diseases of the circulatory system: Secondary | ICD-10-CM

## 2012-03-29 DIAGNOSIS — I079 Rheumatic tricuspid valve disease, unspecified: Secondary | ICD-10-CM

## 2012-03-29 DIAGNOSIS — I701 Atherosclerosis of renal artery: Secondary | ICD-10-CM

## 2012-03-29 DIAGNOSIS — E785 Hyperlipidemia, unspecified: Secondary | ICD-10-CM

## 2012-03-29 DIAGNOSIS — Z950 Presence of cardiac pacemaker: Secondary | ICD-10-CM

## 2012-03-29 DIAGNOSIS — N259 Disorder resulting from impaired renal tubular function, unspecified: Secondary | ICD-10-CM

## 2012-03-29 DIAGNOSIS — I2510 Atherosclerotic heart disease of native coronary artery without angina pectoris: Secondary | ICD-10-CM | POA: Diagnosis not present

## 2012-03-29 DIAGNOSIS — Z954 Presence of other heart-valve replacement: Secondary | ICD-10-CM
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
## 2012-03-29 NOTE — Assessment & Plan Note (Signed)
Repeat renal Dopplers.

## 2012-03-29 NOTE — Assessment & Plan Note (Signed)
Blood pressure controlled. Continue present medications. 

## 2012-03-29 NOTE — Assessment & Plan Note (Signed)
Followed by nephrology. 

## 2012-03-29 NOTE — Assessment & Plan Note (Signed)
Plan continue SBE prophylaxis. Repeat echo when she returns in one year.

## 2012-03-29 NOTE — Assessment & Plan Note (Signed)
Continue SBE prophylaxis. Repeat echocardiogram when she returns in one year.

## 2012-03-29 NOTE — Assessment & Plan Note (Signed)
Continue aspirin and statin. 

## 2012-03-29 NOTE — Assessment & Plan Note (Signed)
Continue statin. Lipids and liver monitored by primary care. 

## 2012-03-29 NOTE — Patient Instructions (Addendum)
Your physician wants you to follow-up in: [place] will receive a reminder letter in the mail two months in advance. If you don't receive a letter, please call our office to schedule the follow-up appointment.   Your physician has requested that you have a renal artery duplex. During this test, an ultrasound is used to evaluate blood flow to the kidneys. Allow one hour for this exam. Do not eat after midnight the day before and avoid carbonated beverages. Take your medications as you usually do.

## 2012-03-29 NOTE — Assessment & Plan Note (Signed)
Managed by electrophysiology.

## 2012-04-01 ENCOUNTER — Encounter (INDEPENDENT_AMBULATORY_CARE_PROVIDER_SITE_OTHER): Payer: Medicare Other

## 2012-04-01 DIAGNOSIS — I700 Atherosclerosis of aorta: Secondary | ICD-10-CM | POA: Diagnosis not present

## 2012-04-01 DIAGNOSIS — I701 Atherosclerosis of renal artery: Secondary | ICD-10-CM

## 2012-04-01 DIAGNOSIS — I10 Essential (primary) hypertension: Secondary | ICD-10-CM | POA: Diagnosis not present

## 2012-04-12 DIAGNOSIS — Z23 Encounter for immunization: Secondary | ICD-10-CM | POA: Diagnosis not present

## 2012-04-16 ENCOUNTER — Encounter: Payer: Self-pay | Admitting: Cardiology

## 2012-04-16 DIAGNOSIS — N2581 Secondary hyperparathyroidism of renal origin: Secondary | ICD-10-CM | POA: Diagnosis not present

## 2012-04-16 DIAGNOSIS — I129 Hypertensive chronic kidney disease with stage 1 through stage 4 chronic kidney disease, or unspecified chronic kidney disease: Secondary | ICD-10-CM | POA: Diagnosis not present

## 2012-04-16 DIAGNOSIS — E213 Hyperparathyroidism, unspecified: Secondary | ICD-10-CM | POA: Diagnosis not present

## 2012-04-16 DIAGNOSIS — D649 Anemia, unspecified: Secondary | ICD-10-CM | POA: Diagnosis not present

## 2012-04-16 DIAGNOSIS — I10 Essential (primary) hypertension: Secondary | ICD-10-CM | POA: Diagnosis not present

## 2012-04-16 DIAGNOSIS — N390 Urinary tract infection, site not specified: Secondary | ICD-10-CM | POA: Diagnosis not present

## 2012-04-16 DIAGNOSIS — N184 Chronic kidney disease, stage 4 (severe): Secondary | ICD-10-CM | POA: Diagnosis not present

## 2012-04-26 ENCOUNTER — Encounter: Payer: Medicare Other | Admitting: *Deleted

## 2012-05-04 ENCOUNTER — Encounter: Payer: Self-pay | Admitting: *Deleted

## 2012-05-12 ENCOUNTER — Encounter: Payer: Self-pay | Admitting: Internal Medicine

## 2012-05-12 ENCOUNTER — Ambulatory Visit (INDEPENDENT_AMBULATORY_CARE_PROVIDER_SITE_OTHER): Payer: Medicare Other | Admitting: *Deleted

## 2012-05-12 DIAGNOSIS — I459 Conduction disorder, unspecified: Secondary | ICD-10-CM

## 2012-05-12 DIAGNOSIS — Z950 Presence of cardiac pacemaker: Secondary | ICD-10-CM

## 2012-05-18 LAB — REMOTE PACEMAKER DEVICE
ATRIAL PACING PM: 99
BAMS-0001: 150 {beats}/min
BATTERY VOLTAGE: 2.8 V
RV LEAD AMPLITUDE: 22.4 mv
VENTRICULAR PACING PM: 1

## 2012-06-14 DIAGNOSIS — H52229 Regular astigmatism, unspecified eye: Secondary | ICD-10-CM | POA: Diagnosis not present

## 2012-06-14 DIAGNOSIS — H5210 Myopia, unspecified eye: Secondary | ICD-10-CM | POA: Diagnosis not present

## 2012-06-14 DIAGNOSIS — H524 Presbyopia: Secondary | ICD-10-CM | POA: Diagnosis not present

## 2012-06-14 DIAGNOSIS — H2589 Other age-related cataract: Secondary | ICD-10-CM | POA: Diagnosis not present

## 2012-06-25 ENCOUNTER — Encounter: Payer: Self-pay | Admitting: *Deleted

## 2012-08-16 ENCOUNTER — Encounter: Payer: Medicare Other | Admitting: *Deleted

## 2012-08-23 ENCOUNTER — Encounter: Payer: Self-pay | Admitting: Cardiology

## 2012-08-23 DIAGNOSIS — E785 Hyperlipidemia, unspecified: Secondary | ICD-10-CM | POA: Diagnosis not present

## 2012-08-23 DIAGNOSIS — N184 Chronic kidney disease, stage 4 (severe): Secondary | ICD-10-CM | POA: Diagnosis not present

## 2012-08-23 DIAGNOSIS — E213 Hyperparathyroidism, unspecified: Secondary | ICD-10-CM | POA: Diagnosis not present

## 2012-08-23 DIAGNOSIS — D649 Anemia, unspecified: Secondary | ICD-10-CM | POA: Diagnosis not present

## 2012-08-23 DIAGNOSIS — I129 Hypertensive chronic kidney disease with stage 1 through stage 4 chronic kidney disease, or unspecified chronic kidney disease: Secondary | ICD-10-CM | POA: Diagnosis not present

## 2012-08-23 DIAGNOSIS — I10 Essential (primary) hypertension: Secondary | ICD-10-CM | POA: Diagnosis not present

## 2012-08-23 DIAGNOSIS — E039 Hypothyroidism, unspecified: Secondary | ICD-10-CM | POA: Diagnosis not present

## 2012-08-30 ENCOUNTER — Encounter: Payer: Self-pay | Admitting: *Deleted

## 2012-09-06 DIAGNOSIS — H2510 Age-related nuclear cataract, unspecified eye: Secondary | ICD-10-CM | POA: Diagnosis not present

## 2012-09-09 ENCOUNTER — Encounter (HOSPITAL_COMMUNITY): Payer: Self-pay | Admitting: Pharmacy Technician

## 2012-09-13 NOTE — Patient Instructions (Addendum)
Your procedure is scheduled on: 09/23/2012  Report to [facility] at  24  AM.  Call this number if you have problems the morning of surgery: [phone]   Do not eat food or drink liquids :After Midnight.      Take these medicines the morning of surgery with A SIP OF WATER: allegra,synthroid,lopressor,prilosec   Do not wear jewelry, make-up or nail polish.  Do not wear lotions, powders, or perfumes.   Do not shave 48 hours prior to surgery.  Do not bring valuables to the hospital.  Contacts, dentures or bridgework may not be worn into surgery.  Leave suitcase in the car. After surgery it may be brought to your room.  For patients admitted to the hospital, checkout time is 11:00 AM the day of discharge.   Patients discharged the day of surgery will not be allowed to drive home.  :     Please read over the following fact sheets that you were given: Coughing and Deep Breathing, Surgical Site Infection Prevention, Anesthesia Post-op Instructions and Care and Recovery After Surgery    Cataract A cataract is a clouding of the lens of the eye. When a lens becomes cloudy, vision is reduced based on the degree and nature of the clouding. Many cataracts reduce vision to some degree. Some cataracts make people more near-sighted as they develop. Other cataracts increase glare. Cataracts that are ignored and become worse can sometimes look white. The white color can be seen through the pupil. CAUSES   Aging. However, cataracts may occur at any age, even in newborns.   Certain drugs.   Trauma to the eye.   Certain diseases such as diabetes.   Specific eye diseases such as chronic inflammation inside the eye or a sudden attack of a rare form of glaucoma.   Inherited or acquired medical problems.  SYMPTOMS   Gradual, progressive drop in vision in the affected eye.   Severe, rapid visual loss. This most often happens when trauma is the cause.  DIAGNOSIS  To detect a cataract, an eye doctor  examines the lens. Cataracts are best diagnosed with an exam of the eyes with the pupils enlarged (dilated) by drops.  TREATMENT  For an early cataract, vision may improve by using different eyeglasses or stronger lighting. If that does not help your vision, surgery is the only effective treatment. A cataract needs to be surgically removed when vision loss interferes with your everyday activities, such as driving, reading, or watching TV. A cataract may also have to be removed if it prevents examination or treatment of another eye problem. Surgery removes the cloudy lens and usually replaces it with a substitute lens (intraocular lens, IOL).  At a time when both you and your doctor agree, the cataract will be surgically removed. If you have cataracts in both eyes, only one is usually removed at a time. This allows the operated eye to heal and be out of danger from any possible problems after surgery (such as infection or poor wound healing). In rare cases, a cataract may be doing damage to your eye. In these cases, your caregiver may advise surgical removal right away. The vast majority of people who have cataract surgery have better vision afterward. HOME CARE INSTRUCTIONS  If you are not planning surgery, you may be asked to do the following:  Use different eyeglasses.   Use stronger or brighter lighting.   Ask your eye doctor about reducing your medicine dose or changing medicines if  it is thought that a medicine caused your cataract. Changing medicines does not make the cataract go away on its own.   Become familiar with your surroundings. Poor vision can lead to injury. Avoid bumping into things on the affected side. You are at a higher risk for tripping or falling.   Exercise extreme care when driving or operating machinery.   Wear sunglasses if you are sensitive to bright light or experiencing problems with glare.  SEEK IMMEDIATE MEDICAL CARE IF:   You have a worsening or sudden vision  loss.   You notice redness, swelling, or increasing pain in the eye.   You have a fever.  Document Released: 06/09/2005 Document Revised: 05/29/2011 Document Reviewed: 01/31/2011 [facility] Patient Information 2012 [place].PATIENT INSTRUCTIONS POST-ANESTHESIA  IMMEDIATELY FOLLOWING SURGERY:  Do not drive or operate machinery for the first twenty four hours after surgery.  Do not make any important decisions for twenty four hours after surgery or while taking narcotic pain medications or sedatives.  If you develop intractable nausea and vomiting or a severe headache please notify your doctor immediately.  FOLLOW-UP:  Please make an appointment with your surgeon as instructed. You do not need to follow up with anesthesia unless specifically instructed to do so.  WOUND CARE INSTRUCTIONS (if applicable):  Keep a dry clean dressing on the anesthesia/puncture wound site if there is drainage.  Once the wound has quit draining you may leave it open to air.  Generally you should leave the bandage intact for twenty four hours unless there is drainage.  If the epidural site drains for more than 36-48 hours please call the anesthesia department.  QUESTIONS?:  Please feel free to call your physician or the hospital operator if you have any questions, and they will be happy to assist you.

## 2012-09-14 ENCOUNTER — Encounter (HOSPITAL_COMMUNITY)
Admission: RE | Admit: 2012-09-14 | Discharge: 2012-09-14 | Disposition: A | Discharge status: Home / Self Care | Payer: Medicare Other | Source: Ambulatory Visit | Attending: Ophthalmology | Admitting: Ophthalmology

## 2012-09-14 ENCOUNTER — Encounter (HOSPITAL_COMMUNITY): Payer: Self-pay

## 2012-09-14 DIAGNOSIS — H2510 Age-related nuclear cataract, unspecified eye: Secondary | ICD-10-CM | POA: Diagnosis not present

## 2012-09-14 DIAGNOSIS — Z951 Presence of aortocoronary bypass graft: Secondary | ICD-10-CM | POA: Diagnosis not present

## 2012-09-14 DIAGNOSIS — Z01812 Encounter for preprocedural laboratory examination: Secondary | ICD-10-CM | POA: Diagnosis not present

## 2012-09-14 DIAGNOSIS — I10 Essential (primary) hypertension: Secondary | ICD-10-CM | POA: Diagnosis not present

## 2012-09-14 HISTORY — DX: Acute myocardial infarction, unspecified: I21.9

## 2012-09-14 LAB — BASIC METABOLIC PANEL
Calcium: 9.4 mg/dL (ref 8.4–10.5)
GFR calc Af Amer: 26 mL/min — ABNORMAL LOW (ref >90)
GFR calc non Af Amer: 22 mL/min — ABNORMAL LOW (ref >90)
Glucose, Bld: 73 mg/dL (ref 70–99)
Potassium: 4 mEq/L (ref 3.5–5.1)
Sodium: 139 mEq/L (ref 135–145)

## 2012-09-14 LAB — HEMOGLOBIN AND HEMATOCRIT, BLOOD
HCT: 39 % (ref 36.0–46.0)
Hemoglobin: 12.8 g/dL (ref 12.0–15.0)

## 2012-09-22 MED ORDER — CYCLOPENTOLATE-PHENYLEPHRINE 0.2-1 % OP SOLN
OPHTHALMIC | Status: AC
  Filled 2012-09-22: qty 2

## 2012-09-22 MED ORDER — PHENYLEPHRINE HCL 2.5 % OP SOLN
OPHTHALMIC | Status: AC
  Filled 2012-09-22: qty 2

## 2012-09-22 MED ORDER — LIDOCAINE HCL (PF) 1 % IJ SOLN
INTRAMUSCULAR | Status: AC
  Filled 2012-09-22: qty 2

## 2012-09-22 MED ORDER — NEOMYCIN-POLYMYXIN-DEXAMETH 3.5-10000-0.1 OP OINT
TOPICAL_OINTMENT | OPHTHALMIC | Status: AC
  Filled 2012-09-22: qty 3.5

## 2012-09-22 MED ORDER — TETRACAINE HCL 0.5 % OP SOLN
OPHTHALMIC | Status: AC
  Filled 2012-09-22: qty 2

## 2012-09-22 MED ORDER — LIDOCAINE HCL 3.5 % OP GEL
OPHTHALMIC | Status: AC
  Filled 2012-09-22: qty 5

## 2012-09-23 ENCOUNTER — Encounter (HOSPITAL_COMMUNITY)
Admission: RE | Disposition: A | Discharge status: Home / Self Care | Payer: Self-pay | Source: Ambulatory Visit | Attending: Ophthalmology

## 2012-09-23 ENCOUNTER — Ambulatory Visit (HOSPITAL_COMMUNITY)
Admission: RE | Admit: 2012-09-23 | Discharge: 2012-09-23 | Disposition: A | Discharge status: Home / Self Care | Payer: Medicare Other | Source: Ambulatory Visit | Attending: Ophthalmology | Admitting: Ophthalmology

## 2012-09-23 ENCOUNTER — Ambulatory Visit (HOSPITAL_COMMUNITY): Payer: Medicare Other | Admitting: Anesthesiology

## 2012-09-23 ENCOUNTER — Encounter (HOSPITAL_COMMUNITY): Payer: Self-pay | Admitting: Anesthesiology

## 2012-09-23 ENCOUNTER — Encounter (HOSPITAL_COMMUNITY): Payer: Self-pay | Admitting: *Deleted

## 2012-09-23 DIAGNOSIS — Z951 Presence of aortocoronary bypass graft: Secondary | ICD-10-CM | POA: Insufficient documentation

## 2012-09-23 DIAGNOSIS — Z01812 Encounter for preprocedural laboratory examination: Secondary | ICD-10-CM | POA: Diagnosis not present

## 2012-09-23 DIAGNOSIS — I10 Essential (primary) hypertension: Secondary | ICD-10-CM | POA: Insufficient documentation

## 2012-09-23 DIAGNOSIS — H2510 Age-related nuclear cataract, unspecified eye: Secondary | ICD-10-CM | POA: Insufficient documentation

## 2012-09-23 DIAGNOSIS — H269 Unspecified cataract: Secondary | ICD-10-CM | POA: Diagnosis not present

## 2012-09-23 HISTORY — PX: CATARACT EXTRACTION W/PHACO: SHX586

## 2012-09-23 SURGERY — PHACOEMULSIFICATION, CATARACT, WITH IOL INSERTION
Anesthesia: Monitor Anesthesia Care | Site: Eye | Laterality: Left | Wound class: Clean

## 2012-09-23 MED ORDER — POVIDONE-IODINE 5 % OP SOLN
OPHTHALMIC | Status: DC | PRN
  Administered 2012-09-23: 1 via OPHTHALMIC

## 2012-09-23 MED ORDER — CYCLOPENTOLATE-PHENYLEPHRINE 0.2-1 % OP SOLN
1.0000 [drp] | OPHTHALMIC | Status: AC
  Administered 2012-09-23 (×3): 1 [drp] via OPHTHALMIC

## 2012-09-23 MED ORDER — BSS IO SOLN
INTRAOCULAR | Status: DC | PRN
  Administered 2012-09-23: 15 mL via INTRAOCULAR

## 2012-09-23 MED ORDER — ONDANSETRON HCL 4 MG/2ML IJ SOLN: 4.0000 mg | Freq: Once | INTRAMUSCULAR | Status: DC | PRN

## 2012-09-23 MED ORDER — LIDOCAINE HCL 3.5 % OP GEL
1.0000 "application " | Freq: Once | OPHTHALMIC | Status: AC
  Administered 2012-09-23: 1 via OPHTHALMIC

## 2012-09-23 MED ORDER — PROVISC 10 MG/ML IO SOLN
INTRAOCULAR | Status: DC | PRN
  Administered 2012-09-23: 8.5 mg via INTRAOCULAR

## 2012-09-23 MED ORDER — LACTATED RINGERS IV SOLN
INTRAVENOUS | Status: DC | PRN
  Administered 2012-09-23: 10:00:00 via INTRAVENOUS

## 2012-09-23 MED ORDER — TETRACAINE HCL 0.5 % OP SOLN
1.0000 [drp] | OPHTHALMIC | Status: AC
  Administered 2012-09-23 (×3): 1 [drp] via OPHTHALMIC

## 2012-09-23 MED ORDER — LIDOCAINE HCL (PF) 1 % IJ SOLN
INTRAMUSCULAR | Status: DC | PRN
  Administered 2012-09-23: .6 mL

## 2012-09-23 MED ORDER — LIDOCAINE 3.5 % OP GEL OPTIME - NO CHARGE
OPHTHALMIC | Status: DC | PRN
  Administered 2012-09-23: 2 [drp] via OPHTHALMIC

## 2012-09-23 MED ORDER — LACTATED RINGERS IV SOLN
INTRAVENOUS | Status: DC
  Administered 2012-09-23: 1000 mL via INTRAVENOUS

## 2012-09-23 MED ORDER — MIDAZOLAM HCL 2 MG/2ML IJ SOLN
1.0000 mg | INTRAMUSCULAR | Status: DC | PRN
  Administered 2012-09-23: 2 mg via INTRAVENOUS

## 2012-09-23 MED ORDER — EPINEPHRINE HCL 1 MG/ML IJ SOLN
INTRAOCULAR | Status: DC | PRN
  Administered 2012-09-23: 10:00:00

## 2012-09-23 MED ORDER — EPINEPHRINE HCL 1 MG/ML IJ SOLN
INTRAMUSCULAR | Status: AC
  Filled 2012-09-23: qty 1

## 2012-09-23 MED ORDER — NEOMYCIN-POLYMYXIN-DEXAMETH 0.1 % OP OINT
TOPICAL_OINTMENT | OPHTHALMIC | Status: DC | PRN
  Administered 2012-09-23: 1 via OPHTHALMIC

## 2012-09-23 MED ORDER — MIDAZOLAM HCL 2 MG/2ML IJ SOLN
INTRAMUSCULAR | Status: AC
  Filled 2012-09-23: qty 2

## 2012-09-23 MED ORDER — PHENYLEPHRINE HCL 2.5 % OP SOLN
1.0000 [drp] | OPHTHALMIC | Status: AC
  Administered 2012-09-23 (×3): 1 [drp] via OPHTHALMIC

## 2012-09-23 MED ORDER — FENTANYL CITRATE 0.05 MG/ML IJ SOLN: 25.0000 ug | INTRAMUSCULAR | Status: DC | PRN

## 2012-09-23 SURGICAL SUPPLY — 32 items

## 2012-09-23 NOTE — Transfer of Care (Signed)
Immediate Anesthesia Transfer of Care Note  Patient: Alexandra Hall  Procedure(s) Performed: Procedure(s) with comments: CATARACT EXTRACTION PHACO AND INTRAOCULAR LENS PLACEMENT (IOC) (Left) - CDE 15.98  Patient Location: PACU  Anesthesia Type:MAC  Level of Consciousness: awake, alert  and oriented  Airway & Oxygen Therapy: Patient Spontanous Breathing and Patient connected to nasal cannula oxygen  Post-op Assessment: Report given to PACU RN and Post -op Vital signs reviewed and stable  Post vital signs: Reviewed and stable  Complications: No apparent anesthesia complications

## 2012-09-23 NOTE — H&P (Signed)
I have reviewed the H&P, the patient was re-examined, and I have identified no interval changes in medical condition and plan of care since the history and physical of record  

## 2012-09-23 NOTE — Op Note (Signed)
Date of Admission: 09/23/12  Date of Surgery: 09/23/12  Pre-Op Dx: Cataract  Left  Eye  Post-Op Dx: Nuclear Cataract Left  Eye, Dx Code 366.16  Surgeon: Tonny Branch, M.D.  Assistants: None  Anesthesia: Topical with MAC  Indications: Painless, progressive loss of vision with compromise of daily activities.  Surgery: Cataract Extraction with Intraocular lens Implant Left Eye  Discription: The patient had dilating drops and viscous lidocaine placed into the left eye in the pre-op holding area. After transfer to the operating room, a time out was performed. The patient was then prepped and draped. Beginning with a 72 degree blade a paracentesis port was made at the surgeon's 2 o'clock position. The anterior chamber was then filled with 2% non-preserved lidocaine. This was followed by filling the anterior chamber with Provisc. A bent cystatome needle was used to create a continuous tear capsulotomy. Hydrodissection was performed with balanced salt solution on a Fine canula. The lens nucleus was then removed using the phacoemulsification handpiece. Residual cortex was removed with the I&A handpiece. The anterior chamber and capsular bag were refilled with Provisc. A posterior chamber intraocular lens was placed into the capsular bag with it's injector. The implant was positioned with the Kuglan hook. The Provisc was then removed from the anterior chamber and capsular bag with the I&A handpiece. Stromal hydration of the main incision and paracentesis port was performed with BSS on a Fine canula. The wounds were tested for leak which was negative. The patient tolerated the procedure well. There were no operative complications. The patient was then transferred to the recovery room in stable condition.  Prosthetic device:  B&L enVista, MX60, power 22.5, [id number].  Specimen: None  EBL: None  Complications: None

## 2012-09-23 NOTE — Anesthesia Preprocedure Evaluation (Signed)
Anesthesia Evaluation  Patient identified by MRN, date of birth, ID band Patient awake    Reviewed: Allergy & Precautions, H&P , NPO status , Patient's Chart, lab work & pertinent test results, reviewed documented beta blocker date and time   History of Anesthesia Complications Negative for: history of anesthetic complications  Airway Mallampati: II TM Distance: >3 FB     Dental  (+) Teeth Intact   Pulmonary shortness of breath and with exertion,  breath sounds clear to auscultation        Cardiovascular hypertension, Pt. on medications + CAD, + Past MI, + CABG and + Peripheral Vascular Disease + dysrhythmias + pacemaker + Valvular Problems/Murmurs (s/p AVR, TVR) AS Rhythm:Regular Rate:Normal     Neuro/Psych    GI/Hepatic GERD-  Medicated and Controlled,  Endo/Other  Hypothyroidism   Renal/GU Renal InsufficiencyRenal disease     Musculoskeletal   Abdominal   Peds  Hematology   Anesthesia Other Findings   Reproductive/Obstetrics                           Anesthesia Physical Anesthesia Plan  ASA: III  Anesthesia Plan: MAC   Post-op Pain Management:    Induction: Intravenous  Airway Management Planned: Nasal Cannula  Additional Equipment:   Intra-op Plan:   Post-operative Plan:   Informed Consent: I have reviewed the patients History and Physical, chart, labs and discussed the procedure including the risks, benefits and alternatives for the proposed anesthesia with the patient or authorized representative who has indicated his/her understanding and acceptance.     Plan Discussed with:   Anesthesia Plan Comments:         Anesthesia Quick Evaluation

## 2012-09-23 NOTE — Anesthesia Postprocedure Evaluation (Signed)
  Anesthesia Post-op Note  Patient: Alexandra Hall  Procedure(s) Performed: Procedure(s) with comments: CATARACT EXTRACTION PHACO AND INTRAOCULAR LENS PLACEMENT (IOC) (Left) - CDE 15.98  Patient Location: PACU  Anesthesia Type:MAC  Level of Consciousness: awake, alert  and oriented  Airway and Oxygen Therapy: Patient Spontanous Breathing  Post-op Pain: none  Post-op Assessment: Post-op Vital signs reviewed, Patient's Cardiovascular Status Stable, Respiratory Function Stable, Patent Airway and No signs of Nausea or vomiting  Post-op Vital Signs: Reviewed and stable  Complications: No apparent anesthesia complications

## 2012-09-23 NOTE — Anesthesia Procedure Notes (Signed)
Procedure Name: MAC Performed by: Tressie Ellis Pre-anesthesia Checklist: Patient identified, Emergency Drugs available, Suction available and Patient being monitored Patient Re-evaluated:Patient Re-evaluated prior to inductionOxygen Delivery Method: Nasal cannula Placement Confirmation: positive ETCO2

## 2012-09-23 NOTE — Addendum Note (Signed)
Addendum created 09/23/12 1205 by Vista Deck, CRNA   Modules edited: Charges VN

## 2012-09-27 ENCOUNTER — Encounter (HOSPITAL_COMMUNITY): Payer: Self-pay | Admitting: Ophthalmology

## 2012-09-27 DIAGNOSIS — H2589 Other age-related cataract: Secondary | ICD-10-CM | POA: Diagnosis not present

## 2012-09-28 ENCOUNTER — Encounter (HOSPITAL_COMMUNITY): Payer: Self-pay | Admitting: Pharmacy Technician

## 2012-09-28 DIAGNOSIS — H2589 Other age-related cataract: Secondary | ICD-10-CM | POA: Diagnosis not present

## 2012-09-29 ENCOUNTER — Encounter (HOSPITAL_COMMUNITY): Payer: Self-pay

## 2012-09-29 ENCOUNTER — Encounter (HOSPITAL_COMMUNITY)
Admission: RE | Admit: 2012-09-29 | Discharge: 2012-09-29 | Disposition: A | Discharge status: Home / Self Care | Payer: Medicare Other | Source: Ambulatory Visit | Attending: Ophthalmology | Admitting: Ophthalmology

## 2012-10-01 MED ORDER — TETRACAINE HCL 0.5 % OP SOLN
OPHTHALMIC | Status: AC
  Filled 2012-10-01: qty 2

## 2012-10-01 MED ORDER — LIDOCAINE HCL (PF) 1 % IJ SOLN
INTRAMUSCULAR | Status: AC
  Filled 2012-10-01: qty 2

## 2012-10-01 MED ORDER — PHENYLEPHRINE HCL 2.5 % OP SOLN
OPHTHALMIC | Status: AC
  Filled 2012-10-01: qty 2

## 2012-10-01 MED ORDER — LIDOCAINE HCL 3.5 % OP GEL
OPHTHALMIC | Status: AC
  Filled 2012-10-01: qty 5

## 2012-10-01 MED ORDER — NEOMYCIN-POLYMYXIN-DEXAMETH 3.5-10000-0.1 OP OINT
TOPICAL_OINTMENT | OPHTHALMIC | Status: AC
  Filled 2012-10-01: qty 3.5

## 2012-10-01 MED ORDER — CYCLOPENTOLATE-PHENYLEPHRINE 0.2-1 % OP SOLN
OPHTHALMIC | Status: AC
  Filled 2012-10-01: qty 2

## 2012-10-04 ENCOUNTER — Encounter (HOSPITAL_COMMUNITY)
Admission: RE | Disposition: A | Discharge status: Home / Self Care | Payer: Self-pay | Source: Ambulatory Visit | Attending: Ophthalmology

## 2012-10-04 ENCOUNTER — Encounter (HOSPITAL_COMMUNITY): Payer: Self-pay | Admitting: *Deleted

## 2012-10-04 ENCOUNTER — Encounter (HOSPITAL_COMMUNITY): Payer: Self-pay | Admitting: Anesthesiology

## 2012-10-04 ENCOUNTER — Ambulatory Visit (HOSPITAL_COMMUNITY)
Admission: RE | Admit: 2012-10-04 | Discharge: 2012-10-04 | Disposition: A | Discharge status: Home / Self Care | Payer: Medicare Other | Source: Ambulatory Visit | Attending: Ophthalmology | Admitting: Ophthalmology

## 2012-10-04 ENCOUNTER — Ambulatory Visit (HOSPITAL_COMMUNITY): Payer: Medicare Other | Admitting: Anesthesiology

## 2012-10-04 DIAGNOSIS — H2589 Other age-related cataract: Secondary | ICD-10-CM | POA: Insufficient documentation

## 2012-10-04 DIAGNOSIS — H269 Unspecified cataract: Secondary | ICD-10-CM | POA: Diagnosis not present

## 2012-10-04 DIAGNOSIS — Z951 Presence of aortocoronary bypass graft: Secondary | ICD-10-CM | POA: Diagnosis not present

## 2012-10-04 DIAGNOSIS — I10 Essential (primary) hypertension: Secondary | ICD-10-CM | POA: Diagnosis not present

## 2012-10-04 DIAGNOSIS — H2510 Age-related nuclear cataract, unspecified eye: Secondary | ICD-10-CM | POA: Diagnosis not present

## 2012-10-04 HISTORY — PX: CATARACT EXTRACTION W/PHACO: SHX586

## 2012-10-04 SURGERY — PHACOEMULSIFICATION, CATARACT, WITH IOL INSERTION
Anesthesia: Monitor Anesthesia Care | Site: Eye | Laterality: Right | Wound class: Clean

## 2012-10-04 MED ORDER — PROVISC 10 MG/ML IO SOLN
INTRAOCULAR | Status: DC | PRN
  Administered 2012-10-04: 8.5 mg via INTRAOCULAR

## 2012-10-04 MED ORDER — NEOMYCIN-POLYMYXIN-DEXAMETH 0.1 % OP OINT
TOPICAL_OINTMENT | OPHTHALMIC | Status: DC | PRN
  Administered 2012-10-04: 1 via OPHTHALMIC

## 2012-10-04 MED ORDER — PHENYLEPHRINE HCL 2.5 % OP SOLN
1.0000 [drp] | OPHTHALMIC | Status: AC
  Administered 2012-10-04 (×3): 1 [drp] via OPHTHALMIC

## 2012-10-04 MED ORDER — LIDOCAINE HCL 3.5 % OP GEL
1.0000 "application " | Freq: Once | OPHTHALMIC | Status: AC
  Administered 2012-10-04: 1 via OPHTHALMIC

## 2012-10-04 MED ORDER — CYCLOPENTOLATE-PHENYLEPHRINE 0.2-1 % OP SOLN
1.0000 [drp] | OPHTHALMIC | Status: AC
  Administered 2012-10-04 (×3): 1 [drp] via OPHTHALMIC

## 2012-10-04 MED ORDER — TETRACAINE HCL 0.5 % OP SOLN
1.0000 [drp] | OPHTHALMIC | Status: AC
  Administered 2012-10-04 (×3): 1 [drp] via OPHTHALMIC

## 2012-10-04 MED ORDER — LIDOCAINE HCL (PF) 1 % IJ SOLN
INTRAMUSCULAR | Status: DC | PRN
  Administered 2012-10-04: .5 mL

## 2012-10-04 MED ORDER — EPINEPHRINE HCL 1 MG/ML IJ SOLN
INTRAMUSCULAR | Status: AC
  Filled 2012-10-04: qty 1

## 2012-10-04 MED ORDER — MIDAZOLAM HCL 2 MG/2ML IJ SOLN
1.0000 mg | INTRAMUSCULAR | Status: DC | PRN
  Administered 2012-10-04: 2 mg via INTRAVENOUS

## 2012-10-04 MED ORDER — LIDOCAINE 3.5 % OP GEL OPTIME - NO CHARGE
OPHTHALMIC | Status: DC | PRN
  Administered 2012-10-04: 1 [drp] via OPHTHALMIC

## 2012-10-04 MED ORDER — MIDAZOLAM HCL 2 MG/2ML IJ SOLN
INTRAMUSCULAR | Status: AC
  Filled 2012-10-04: qty 2

## 2012-10-04 MED ORDER — EPINEPHRINE HCL 1 MG/ML IJ SOLN
INTRAOCULAR | Status: DC | PRN
  Administered 2012-10-04: 11:00:00

## 2012-10-04 MED ORDER — POVIDONE-IODINE 5 % OP SOLN
OPHTHALMIC | Status: DC | PRN
  Administered 2012-10-04: 1 via OPHTHALMIC

## 2012-10-04 MED ORDER — LACTATED RINGERS IV SOLN
INTRAVENOUS | Status: DC
  Administered 2012-10-04: 10:00:00 via INTRAVENOUS

## 2012-10-04 MED ORDER — BSS IO SOLN
INTRAOCULAR | Status: DC | PRN
  Administered 2012-10-04: 15 mL via INTRAOCULAR

## 2012-10-04 SURGICAL SUPPLY — 31 items
CAPSULAR TENSION RING-AMO (OPHTHALMIC RELATED) IMPLANT
CLOTH BEACON ORANGE TIMEOUT ST (SAFETY) ×1 IMPLANT
EYE SHIELD UNIVERSAL CLEAR (GAUZE/BANDAGES/DRESSINGS) ×1 IMPLANT
GLOVE BIO SURGEON STRL SZ 6.5 (GLOVE) IMPLANT
GLOVE BIOGEL PI IND STRL 6.5 (GLOVE) IMPLANT
GLOVE BIOGEL PI IND STRL 7.0 (GLOVE) IMPLANT
GLOVE BIOGEL PI IND STRL 7.5 (GLOVE) IMPLANT
GLOVE BIOGEL PI INDICATOR 6.5 (GLOVE)
GLOVE BIOGEL PI INDICATOR 7.0 (GLOVE) ×1
GLOVE BIOGEL PI INDICATOR 7.5 (GLOVE)
GLOVE ECLIPSE 6.5 STRL STRAW (GLOVE) IMPLANT
GLOVE ECLIPSE 7.0 STRL STRAW (GLOVE) IMPLANT
GLOVE ECLIPSE 7.5 STRL STRAW (GLOVE) IMPLANT
GLOVE EXAM NITRILE LRG STRL (GLOVE) ×1 IMPLANT
GLOVE EXAM NITRILE MD LF STRL (GLOVE) IMPLANT
GLOVE SKINSENSE NS SZ6.5 (GLOVE)
GLOVE SKINSENSE NS SZ7.0 (GLOVE)
GLOVE SKINSENSE STRL SZ6.5 (GLOVE) IMPLANT
GLOVE SKINSENSE STRL SZ7.0 (GLOVE) IMPLANT
KIT VITRECTOMY (OPHTHALMIC RELATED) IMPLANT
PAD ARMBOARD 7.5X6 YLW CONV (MISCELLANEOUS) ×1 IMPLANT
PROC W NO LENS (INTRAOCULAR LENS)
PROC W SPEC LENS (INTRAOCULAR LENS)
PROCESS W NO LENS (INTRAOCULAR LENS) IMPLANT
PROCESS W SPEC LENS (INTRAOCULAR LENS) IMPLANT
RING MALYGIN (MISCELLANEOUS) IMPLANT
SIGHTPATH CAT PROC W REG LENS (Ophthalmic Related) ×2 IMPLANT
SYR TB 1ML LL NO SAFETY (SYRINGE) ×1 IMPLANT
TAPE PAPER MEDFIX 1IN X 10YD (GAUZE/BANDAGES/DRESSINGS) ×1 IMPLANT
VISCOELASTIC ADDITIONAL (OPHTHALMIC RELATED) IMPLANT
WATER STERILE IRR 250ML POUR (IV SOLUTION) ×1 IMPLANT

## 2012-10-04 NOTE — Transfer of Care (Signed)
Immediate Anesthesia Transfer of Care Note  Patient: Alexandra Hall  Procedure(s) Performed: Procedure(s) (LRB): CATARACT EXTRACTION PHACO AND INTRAOCULAR LENS PLACEMENT (IOC) (Right)  Patient Location: Shortstay  Anesthesia Type: MAC  Level of Consciousness: awake  Airway & Oxygen Therapy: Patient Spontanous Breathing   Post-op Assessment: Report given to PACU RN, Post -op Vital signs reviewed and stable and Patient moving all extremities  Post vital signs: Reviewed and stable  Complications: No apparent anesthesia complications

## 2012-10-04 NOTE — Anesthesia Preprocedure Evaluation (Signed)
Anesthesia Evaluation  Patient identified by MRN, date of birth, ID band Patient awake    Reviewed: Allergy & Precautions, H&P , NPO status , Patient's Chart, lab work & pertinent test results, reviewed documented beta blocker date and time   History of Anesthesia Complications Negative for: history of anesthetic complications  Airway Mallampati: II TM Distance: >3 FB     Dental  (+) Teeth Intact   Pulmonary shortness of breath and with exertion,  breath sounds clear to auscultation        Cardiovascular hypertension, Pt. on medications + CAD, + Past MI, + CABG and + Peripheral Vascular Disease + dysrhythmias + pacemaker + Valvular Problems/Murmurs (s/p AVR, TVR) AS Rhythm:Regular Rate:Normal     Neuro/Psych    GI/Hepatic GERD-  Medicated and Controlled,  Endo/Other  Hypothyroidism   Renal/GU Renal InsufficiencyRenal disease     Musculoskeletal   Abdominal   Peds  Hematology   Anesthesia Other Findings   Reproductive/Obstetrics                           Anesthesia Physical Anesthesia Plan  ASA: III  Anesthesia Plan: MAC   Post-op Pain Management:    Induction: Intravenous  Airway Management Planned: Nasal Cannula  Additional Equipment:   Intra-op Plan:   Post-operative Plan:   Informed Consent: I have reviewed the patients History and Physical, chart, labs and discussed the procedure including the risks, benefits and alternatives for the proposed anesthesia with the patient or authorized representative who has indicated his/her understanding and acceptance.     Plan Discussed with:   Anesthesia Plan Comments:         Anesthesia Quick Evaluation

## 2012-10-04 NOTE — Anesthesia Procedure Notes (Signed)
Procedure Name: MAC Date/Time: 10/04/2012 11:05 AM Performed by: Vista Deck Pre-anesthesia Checklist: Patient identified, Emergency Drugs available, Suction available, Timeout performed and Patient being monitored Patient Re-evaluated:Patient Re-evaluated prior to inductionOxygen Delivery Method: Nasal Cannula

## 2012-10-04 NOTE — Op Note (Signed)
Date of Admission: 10/04/12  Date of Surgery: 10/04/12  Pre-Op Dx: Cataract  Right Eye  Post-Op Dx: Combined Cataract Right Eye, Dx Code 366.19  Surgeon: Tonny Branch, M.D.  Assistants: None  Anesthesia: Topical with MAC  Indications: Painless, progressive loss of vision with compromise of daily activities.  Surgery: Cataract Extraction with Intraocular lens Implant Right Eye  Discription: The patient had dilating drops and viscous lidocaine placed into the left eye in the pre-op holding area. After transfer to the operating room, a time out was performed. The patient was then prepped and draped. Beginning with a 80 degree blade a paracentesis port was made at the surgeon's 2 o'clock position. The anterior chamber was then filled with 2% non-preserved lidocaine. This was followed by filling the anterior chamber with Provisc. A bent cystatome needle was used to create a continuous tear capsulotomy. Hydrodissection was performed with balanced salt solution on a Fine canula. The lens nucleus was then removed using the phacoemulsification handpiece. Residual cortex was removed with the I&A handpiece. The anterior chamber and capsular bag were refilled with Provisc. A posterior chamber intraocular lens was placed into the capsular bag with it's injector. The implant was positioned with the Kuglan hook. The Provisc was then removed from the anterior chamber and capsular bag with the I&A handpiece. Stromal hydration of the main incision and paracentesis port was performed with BSS on a Fine canula. The wounds were tested for leak which was negative. The patient tolerated the procedure well. There were no operative complications. The patient was then transferred to the recovery room in stable condition.  Prosthetic device:  B&L enVista, MX60, power 22.0D.  Specimen: None  EBL: None  Complications: None

## 2012-10-04 NOTE — Anesthesia Postprocedure Evaluation (Signed)
  Anesthesia Post-op Note  Patient: Alexandra Hall  Procedure(s) Performed: Procedure(s) (LRB): CATARACT EXTRACTION PHACO AND INTRAOCULAR LENS PLACEMENT (IOC) (Right)  Patient Location:  Short Stay  Anesthesia Type: MAC  Level of Consciousness: awake  Airway and Oxygen Therapy: Patient Spontanous Breathing  Post-op Pain: none  Post-op Assessment: Post-op Vital signs reviewed, Patient's Cardiovascular Status Stable, Respiratory Function Stable, Patent Airway, No signs of Nausea or vomiting and Pain level controlled  Post-op Vital Signs: Reviewed and stable  Complications: No apparent anesthesia complications

## 2012-10-04 NOTE — H&P (Signed)
I have reviewed the H&P, the patient was re-examined, and I have identified no interval changes in medical condition and plan of care since the history and physical of record  

## 2012-10-05 ENCOUNTER — Encounter (HOSPITAL_COMMUNITY): Payer: Self-pay | Admitting: Ophthalmology

## 2012-10-18 ENCOUNTER — Other Ambulatory Visit: Payer: Self-pay

## 2012-10-18 MED ORDER — FUROSEMIDE 20 MG PO TABS: 20.0000 mg | ORAL_TABLET | Freq: Every day | ORAL | Status: DC

## 2012-10-26 ENCOUNTER — Encounter: Payer: Self-pay | Admitting: Family Medicine

## 2012-10-26 ENCOUNTER — Ambulatory Visit (INDEPENDENT_AMBULATORY_CARE_PROVIDER_SITE_OTHER): Payer: Medicare Other | Admitting: Family Medicine

## 2012-10-26 VITALS — BP 114/67 | Temp 98.4°F | Wt 155.8 lb

## 2012-10-26 DIAGNOSIS — J019 Acute sinusitis, unspecified: Secondary | ICD-10-CM

## 2012-10-26 DIAGNOSIS — R279 Unspecified lack of coordination: Secondary | ICD-10-CM

## 2012-10-26 DIAGNOSIS — R270 Ataxia, unspecified: Secondary | ICD-10-CM

## 2012-10-26 MED ORDER — AMOXICILLIN 500 MG PO TABS: 500.0000 mg | ORAL_TABLET | Freq: Two times a day (BID) | ORAL | Status: DC

## 2012-10-26 NOTE — Progress Notes (Signed)
  Subjective:    Patient ID: Alexandra Hall, female    DOB: 10/03/1921, 79 y.o.   MRN: [id number]  HPIpatient is concerned about head congestion drainage coughing present over the past 4-5 days denies high fever chills sweats nausea vomiting just increased coughing over the past 24 hours because of her age they're worried and wanted her to be checked. She also has had some ataxia off and on over the past few months when she stands up and walks sometimes she wavers most of the time she uses a walker sometimes she does holds onto the sidewall. No falls. Past medical history renal insufficiency heart disease pacemaker    Review of Systems See above, no fevers no shortness of breath wheezing or chest pressure.    Objective:   Physical Exam HEENT benign neck no masses lungs are clear no crackle heart rate not fast pulse normal skin warm dry blood pressure checked both sitting and standing sitting 130/78 standing 122/72 patient does have slight ataxia will       Assessment & Plan:  URI mild acute sinusitis amoxicillin 3 times a day 10 days Ataxia age related no sign of significant orthostasis. May use a cane. Followup within the next 3-5 months for regular visit and lab work. She sees the heart doctor a regular basis and kidney doctor.

## 2012-10-26 NOTE — Patient Instructions (Signed)
If high fevers or difficulty breathing call for recheck asap

## 2012-11-17 ENCOUNTER — Other Ambulatory Visit: Payer: Self-pay | Admitting: Family Medicine

## 2012-12-02 ENCOUNTER — Other Ambulatory Visit: Payer: Self-pay | Admitting: Family Medicine

## 2013-01-20 DIAGNOSIS — I129 Hypertensive chronic kidney disease with stage 1 through stage 4 chronic kidney disease, or unspecified chronic kidney disease: Secondary | ICD-10-CM | POA: Diagnosis not present

## 2013-01-20 DIAGNOSIS — I10 Essential (primary) hypertension: Secondary | ICD-10-CM | POA: Diagnosis not present

## 2013-01-20 DIAGNOSIS — N184 Chronic kidney disease, stage 4 (severe): Secondary | ICD-10-CM | POA: Diagnosis not present

## 2013-01-20 DIAGNOSIS — N2581 Secondary hyperparathyroidism of renal origin: Secondary | ICD-10-CM | POA: Diagnosis not present

## 2013-02-01 ENCOUNTER — Encounter: Payer: Self-pay | Admitting: Internal Medicine

## 2013-02-01 ENCOUNTER — Ambulatory Visit (INDEPENDENT_AMBULATORY_CARE_PROVIDER_SITE_OTHER): Payer: Medicare Other | Admitting: Internal Medicine

## 2013-02-01 VITALS — BP 130/70 | HR 89 | Ht 63.5 in | Wt 158.0 lb

## 2013-02-01 DIAGNOSIS — Z8679 Personal history of other diseases of the circulatory system: Secondary | ICD-10-CM

## 2013-02-01 DIAGNOSIS — I2510 Atherosclerotic heart disease of native coronary artery without angina pectoris: Secondary | ICD-10-CM | POA: Diagnosis not present

## 2013-02-01 DIAGNOSIS — I498 Other specified cardiac arrhythmias: Secondary | ICD-10-CM | POA: Diagnosis not present

## 2013-02-01 DIAGNOSIS — Z950 Presence of cardiac pacemaker: Secondary | ICD-10-CM

## 2013-02-01 LAB — PACEMAKER DEVICE OBSERVATION
AL IMPEDENCE PM: 403 Ohm
BAMS-0001: 150 {beats}/min
BATTERY VOLTAGE: 2.79 V
RV LEAD AMPLITUDE: 15.67 mv
RV LEAD IMPEDENCE PM: 596 Ohm
VENTRICULAR PACING PM: 1
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
## 2013-02-01 NOTE — Assessment & Plan Note (Signed)
Her disease appears to be well controlled. She has not had any obvious anginal symptoms. Will follow.

## 2013-02-01 NOTE — Assessment & Plan Note (Signed)
Her medtronic DDD PM is working normally. Will recheck in several months. 

## 2013-02-01 NOTE — Assessment & Plan Note (Signed)
Her blood pressure appears to be well controlled. Will follow. No change in medical therapy.

## 2013-02-01 NOTE — Patient Instructions (Addendum)
Please send a [employer] transmission on November 17.2014  Your physician wants you to follow-up in: 1 year with Dr. Lovena Le.  You will receive a reminder letter in the mail two months in advance. If you don't receive a letter, please call our office to schedule the follow-up appointment.

## 2013-02-02 ENCOUNTER — Other Ambulatory Visit: Payer: Self-pay | Admitting: Family Medicine

## 2013-02-04 ENCOUNTER — Encounter: Payer: Self-pay | Admitting: Internal Medicine

## 2013-04-01 DIAGNOSIS — Z23 Encounter for immunization: Secondary | ICD-10-CM | POA: Diagnosis not present

## 2013-04-04 ENCOUNTER — Other Ambulatory Visit: Payer: Self-pay | Admitting: Family Medicine

## 2013-04-08 DIAGNOSIS — N184 Chronic kidney disease, stage 4 (severe): Secondary | ICD-10-CM | POA: Diagnosis not present

## 2013-04-08 DIAGNOSIS — D631 Anemia in chronic kidney disease: Secondary | ICD-10-CM | POA: Diagnosis not present

## 2013-04-08 DIAGNOSIS — E213 Hyperparathyroidism, unspecified: Secondary | ICD-10-CM | POA: Diagnosis not present

## 2013-04-08 DIAGNOSIS — I10 Essential (primary) hypertension: Secondary | ICD-10-CM | POA: Diagnosis not present

## 2013-05-04 ENCOUNTER — Other Ambulatory Visit: Payer: Self-pay | Admitting: Family Medicine

## 2013-05-09 ENCOUNTER — Ambulatory Visit (INDEPENDENT_AMBULATORY_CARE_PROVIDER_SITE_OTHER): Payer: Medicare Other | Admitting: *Deleted

## 2013-05-09 DIAGNOSIS — I498 Other specified cardiac arrhythmias: Secondary | ICD-10-CM | POA: Diagnosis not present

## 2013-05-09 DIAGNOSIS — Z950 Presence of cardiac pacemaker: Secondary | ICD-10-CM

## 2013-05-13 ENCOUNTER — Other Ambulatory Visit: Payer: Self-pay | Admitting: Family Medicine

## 2013-05-16 LAB — MDC_IDC_ENUM_SESS_TYPE_REMOTE
Battery Impedance: 172 Ohm
Brady Statistic AP VP Percent: 1 %
Brady Statistic AS VP Percent: 0 %
Brady Statistic AS VS Percent: 0 %
Date Time Interrogation Session: 20141117203342
Lead Channel Impedance Value: 398 Ohm
Lead Channel Impedance Value: 585 Ohm
Lead Channel Pacing Threshold Amplitude: 0.625 V
Lead Channel Pacing Threshold Pulse Width: 0.4 ms
Lead Channel Sensing Intrinsic Amplitude: 22.4 mV
Lead Channel Setting Pacing Amplitude: 3 V
Lead Channel Setting Sensing Sensitivity: 4 mV

## 2013-05-24 ENCOUNTER — Encounter: Payer: Self-pay | Admitting: *Deleted

## 2013-06-01 ENCOUNTER — Other Ambulatory Visit: Payer: Self-pay | Admitting: Dermatology

## 2013-06-01 DIAGNOSIS — L940 Localized scleroderma [morphea]: Secondary | ICD-10-CM | POA: Diagnosis not present

## 2013-06-01 DIAGNOSIS — D485 Neoplasm of uncertain behavior of skin: Secondary | ICD-10-CM | POA: Diagnosis not present

## 2013-06-20 ENCOUNTER — Encounter: Payer: Self-pay | Admitting: Internal Medicine

## 2013-07-21 DIAGNOSIS — D649 Anemia, unspecified: Secondary | ICD-10-CM | POA: Diagnosis not present

## 2013-07-21 DIAGNOSIS — N390 Urinary tract infection, site not specified: Secondary | ICD-10-CM | POA: Diagnosis not present

## 2013-07-21 DIAGNOSIS — N2581 Secondary hyperparathyroidism of renal origin: Secondary | ICD-10-CM | POA: Diagnosis not present

## 2013-07-21 DIAGNOSIS — N184 Chronic kidney disease, stage 4 (severe): Secondary | ICD-10-CM | POA: Diagnosis not present

## 2013-08-01 ENCOUNTER — Other Ambulatory Visit: Payer: Self-pay | Admitting: Family Medicine

## 2013-08-02 ENCOUNTER — Other Ambulatory Visit: Payer: Self-pay

## 2013-08-02 DIAGNOSIS — L940 Localized scleroderma [morphea]: Secondary | ICD-10-CM | POA: Diagnosis not present

## 2013-08-02 MED ORDER — FUROSEMIDE 20 MG PO TABS
20.0000 mg | ORAL_TABLET | Freq: Every day | ORAL | Status: DC
Start: 2013-08-02 — End: 2014-02-17

## 2013-08-12 ENCOUNTER — Encounter: Payer: Self-pay | Admitting: Internal Medicine

## 2013-08-12 ENCOUNTER — Ambulatory Visit (INDEPENDENT_AMBULATORY_CARE_PROVIDER_SITE_OTHER): Payer: Medicare Other | Admitting: *Deleted

## 2013-08-12 DIAGNOSIS — I498 Other specified cardiac arrhythmias: Secondary | ICD-10-CM | POA: Diagnosis not present

## 2013-08-12 LAB — MDC_IDC_ENUM_SESS_TYPE_REMOTE
Battery Impedance: 221 Ohm
Battery Voltage: 2.79 V
Brady Statistic AP VS Percent: 98 %
Brady Statistic AS VP Percent: 0 %
Brady Statistic AS VS Percent: 0 %
Date Time Interrogation Session: 20150220105631
Lead Channel Impedance Value: 608 Ohm
Lead Channel Pacing Threshold Amplitude: 0.5 V
Lead Channel Pacing Threshold Amplitude: 0.625 V
Lead Channel Pacing Threshold Pulse Width: 0.4 ms
Lead Channel Pacing Threshold Pulse Width: 0.4 ms
Lead Channel Setting Pacing Pulse Width: 0.4 ms
MDC IDC MSMT BATTERY REMAINING LONGEVITY: 100 mo
MDC IDC MSMT LEADCHNL RA IMPEDANCE VALUE: 398 Ohm
MDC IDC MSMT LEADCHNL RV SENSING INTR AMPL: 11.2 mV
MDC IDC SET LEADCHNL RA PACING AMPLITUDE: 2 V
MDC IDC SET LEADCHNL RV PACING AMPLITUDE: 2.5 V
MDC IDC SET LEADCHNL RV SENSING SENSITIVITY: 5.6 mV
MDC IDC STAT BRADY AP VP PERCENT: 2 %

## 2013-08-17 ENCOUNTER — Encounter: Payer: Self-pay | Admitting: *Deleted

## 2013-08-19 ENCOUNTER — Other Ambulatory Visit: Payer: Self-pay | Admitting: Family Medicine

## 2013-08-29 ENCOUNTER — Other Ambulatory Visit: Payer: Self-pay | Admitting: Family Medicine

## 2013-09-28 ENCOUNTER — Other Ambulatory Visit: Payer: Self-pay | Admitting: Family Medicine

## 2013-10-17 ENCOUNTER — Ambulatory Visit (INDEPENDENT_AMBULATORY_CARE_PROVIDER_SITE_OTHER): Payer: Medicare Other | Admitting: Family Medicine

## 2013-10-17 ENCOUNTER — Encounter: Payer: Self-pay | Admitting: Family Medicine

## 2013-10-17 VITALS — BP 118/70 | Ht 64.0 in

## 2013-10-17 DIAGNOSIS — M109 Gout, unspecified: Secondary | ICD-10-CM | POA: Diagnosis not present

## 2013-10-17 MED ORDER — COLCHICINE 0.6 MG PO TABS: ORAL_TABLET | ORAL | Status: DC

## 2013-10-17 MED ORDER — HYDROCODONE-ACETAMINOPHEN 5-325 MG PO TABS: 1.0000 | ORAL_TABLET | Freq: Four times a day (QID) | ORAL | Status: DC | PRN

## 2013-10-17 NOTE — Progress Notes (Signed)
   Subjective:    Patient ID: Alexandra Hall, female    DOB: 06/23/1922, 80 y.o.   MRN: [id number]  HPIRight foot pain and swelling. Started 4 days ago. Taking tylenol.   Started several days ago worsened over the weekend to the point where she can't stand anything touching it denies fever chills she has a history renal insufficiency.  Review of Systems Knows of no injury to it.    Objective:   Physical Exam  Lungs clear hearts regular Ear edematous foot with a painful him TPA on the right side redness around it some redness in the forefoot as well some swelling. The main point of tenderness is the MTP      Assessment & Plan:  Doubt-she does have a significant renal insufficiency. I would recommend colchicine 0.6 mg half tablet twice a day till relief if significant side effects stop. Also recommend she sees her nephrologist to get a uric acid level in up-to-date metabolic 7. Patient may need further medications depending on the results of this.

## 2013-10-24 ENCOUNTER — Other Ambulatory Visit: Payer: Self-pay | Admitting: Nephrology

## 2013-10-24 ENCOUNTER — Ambulatory Visit
Admission: RE | Admit: 2013-10-24 | Discharge: 2013-10-24 | Disposition: A | Discharge status: Home / Self Care | Payer: Medicare Other | Source: Ambulatory Visit | Attending: Nephrology | Admitting: Nephrology

## 2013-10-24 DIAGNOSIS — M79671 Pain in right foot: Secondary | ICD-10-CM

## 2013-10-24 DIAGNOSIS — D631 Anemia in chronic kidney disease: Secondary | ICD-10-CM | POA: Diagnosis not present

## 2013-10-24 DIAGNOSIS — M7989 Other specified soft tissue disorders: Secondary | ICD-10-CM | POA: Diagnosis not present

## 2013-10-24 DIAGNOSIS — I129 Hypertensive chronic kidney disease with stage 1 through stage 4 chronic kidney disease, or unspecified chronic kidney disease: Secondary | ICD-10-CM | POA: Diagnosis not present

## 2013-10-24 DIAGNOSIS — M79609 Pain in unspecified limb: Secondary | ICD-10-CM | POA: Diagnosis not present

## 2013-10-24 DIAGNOSIS — N2581 Secondary hyperparathyroidism of renal origin: Secondary | ICD-10-CM | POA: Diagnosis not present

## 2013-10-24 DIAGNOSIS — N184 Chronic kidney disease, stage 4 (severe): Secondary | ICD-10-CM | POA: Diagnosis not present

## 2013-10-24 DIAGNOSIS — S8990XA Unspecified injury of unspecified lower leg, initial encounter: Secondary | ICD-10-CM | POA: Diagnosis not present

## 2013-10-24 DIAGNOSIS — S99919A Unspecified injury of unspecified ankle, initial encounter: Secondary | ICD-10-CM | POA: Diagnosis not present

## 2013-10-28 ENCOUNTER — Encounter: Payer: Self-pay | Admitting: Podiatrist

## 2013-10-28 ENCOUNTER — Ambulatory Visit (INDEPENDENT_AMBULATORY_CARE_PROVIDER_SITE_OTHER): Payer: Medicare Other | Admitting: Podiatrist

## 2013-10-28 VITALS — BP 135/82 | HR 87 | Resp 17 | Ht 60.0 in | Wt 152.0 lb

## 2013-10-28 DIAGNOSIS — M109 Gout, unspecified: Secondary | ICD-10-CM | POA: Diagnosis not present

## 2013-10-28 MED ORDER — PREDNISONE 5 MG PO TABS: 5.0000 mg | ORAL_TABLET | Freq: Every day | ORAL | Status: DC

## 2013-10-28 MED ORDER — TRIAMCINOLONE ACETONIDE 10 MG/ML IJ SUSP
10.0000 mg | Freq: Once | INTRAMUSCULAR | Status: AC
  Administered 2013-10-28: 10 mg

## 2013-10-28 NOTE — Patient Instructions (Signed)
I am starting you on an oral steroid for a week to help with the gout flare-- this is well tolerated and will help with the pain and swelling.    Gout Gout is an inflammatory arthritis caused by a buildup of uric acid crystals in the joints. Uric acid is a chemical that is normally present in the blood. When the level of uric acid in the blood is too high it can form crystals that deposit in your joints and tissues. This causes joint redness, soreness, and swelling (inflammation). Repeat attacks are common. Over time, uric acid crystals can form into masses (tophi) near a joint, destroying bone and causing disfigurement. Gout is treatable and often preventable. CAUSES  The disease begins with elevated levels of uric acid in the blood. Uric acid is produced by your body when it breaks down a naturally found substance called purines. Certain foods you eat, such as meats and fish, contain high amounts of purines. Causes of an elevated uric acid level include:  Being passed down from parent to child (heredity).  Diseases that cause increased uric acid production (such as obesity, psoriasis, and certain cancers).  Excessive alcohol use.  Diet, especially diets rich in meat and seafood.  Medicines, including certain cancer-fighting medicines (chemotherapy), water pills (diuretics), and aspirin.  Chronic kidney disease. The kidneys are no longer able to remove uric acid well.  Problems with metabolism. Conditions strongly associated with gout include:  Obesity.  High blood pressure.  High cholesterol.  Diabetes. Not everyone with elevated uric acid levels gets gout. It is not understood why some people get gout and others do not. Surgery, joint injury, and eating too much of certain foods are some of the factors that can lead to gout attacks. SYMPTOMS   An attack of gout comes on quickly. It causes intense pain with redness, swelling, and warmth in a joint.  Fever can occur.  Often, only  one joint is involved. Certain joints are more commonly involved:  Base of the big toe.  Knee.  Ankle.  Wrist.  Finger. Without treatment, an attack usually goes away in a few days to weeks. Between attacks, you usually will not have symptoms, which is different from many other forms of arthritis. DIAGNOSIS  Your caregiver will suspect gout based on your symptoms and exam. In some cases, tests may be recommended. The tests may include:  Blood tests.  Urine tests.  X-rays.  Joint fluid exam. This exam requires a needle to remove fluid from the joint (arthrocentesis). Using a microscope, gout is confirmed when uric acid crystals are seen in the joint fluid. TREATMENT  There are two phases to gout treatment: treating the sudden onset (acute) attack and preventing attacks (prophylaxis).  Treatment of an Acute Attack.  Medicines are used. These include anti-inflammatory medicines or steroid medicines.  An injection of steroid medicine into the affected joint is sometimes necessary.  The painful joint is rested. Movement can worsen the arthritis.  You may use warm or cold treatments on painful joints, depending which works best for you.  Treatment to Prevent Attacks.  If you suffer from frequent gout attacks, your caregiver may advise preventive medicine. These medicines are started after the acute attack subsides. These medicines either help your kidneys eliminate uric acid from your body or decrease your uric acid production. You may need to stay on these medicines for a very long time.  The early phase of treatment with preventive medicine can be associated with an increase  in acute gout attacks. For this reason, during the first few months of treatment, your caregiver may also advise you to take medicines usually used for acute gout treatment. Be sure you understand your caregiver's directions. Your caregiver may make several adjustments to your medicine dose before these  medicines are effective.  Discuss dietary treatment with your caregiver or dietitian. Alcohol and drinks high in sugar and fructose and foods such as meat, poultry, and seafood can increase uric acid levels. Your caregiver or dietician can advise you on drinks and foods that should be limited. HOME CARE INSTRUCTIONS   Do not take aspirin to relieve pain. This raises uric acid levels.  Only take over-the-counter or prescription medicines for pain, discomfort, or fever as directed by your caregiver.  Rest the joint as much as possible. When in bed, keep sheets and blankets off painful areas.  Keep the affected joint raised (elevated).  Apply warm or cold treatments to painful joints. Use of warm or cold treatments depends on which works best for you.  Use crutches if the painful joint is in your leg.  Drink enough fluids to keep your urine clear or pale yellow. This helps your body get rid of uric acid. Limit alcohol, sugary drinks, and fructose drinks.  Follow your dietary instructions. Pay careful attention to the amount of protein you eat. Your daily diet should emphasize fruits, vegetables, whole grains, and fat-free or low-fat milk products. Discuss the use of coffee, vitamin C, and cherries with your caregiver or dietician. These may be helpful in lowering uric acid levels.  Maintain a healthy body weight. SEEK MEDICAL CARE IF:   You develop diarrhea, vomiting, or any side effects from medicines.  You do not feel better in 24 hours, or you are getting worse. SEEK IMMEDIATE MEDICAL CARE IF:   Your joint becomes suddenly more tender, and you have chills or a fever. MAKE SURE YOU:   Understand these instructions.  Will watch your condition.  Will get help right away if you are not doing well or get worse. Document Released: 06/06/2000 Document Revised: 10/04/2012 Document Reviewed: 01/21/2012 [facility] Patient Information 2014 [place].

## 2013-10-28 NOTE — Progress Notes (Signed)
   Subjective:    Patient ID: Alexandra Hall, female    DOB: April 08, 1922, 80 y.o.   MRN: [id number]  HPI Comments: Pt complains of right foot swelling for about 2 weeks.  Pt primary doctor referred to Dr Detterding tested Uric Acid and referred to [facility].    Foot Pain Associated symptoms include arthralgias.      Review of Systems  Musculoskeletal: Positive for arthralgias.       Right shoulder pain.  All other systems reviewed and are negative.      Objective:   Physical Exam Vascular status is intact with palpable pedal pulses at 1/4 DP and PT bilateral. Capillary refill time is within normal limits bilateral. Neurological sensation is intact both epicriticaly and protectively bilateral. Redness and swelling is present on the right foot. She states it started at her right great toe joint that has spread to her entire foot. She states it's been red and swollen for about 2 weeks. she was taking colcrys however became concerned over its effect on the kidneys. She has low kidney function. Dermatological examination reveals redness and swelling as well the right foot. No open lesions, no interdigital maceration is present.     Assessment & Plan:   gout right foot   Plan: Injected the right foot with Kenalog and Marcaine mixture under sterile technique without complication. I was able to see her x-rays and there is no sign of fracture or dislocation present. Mild diffuse swelling is present as well as diffuse osteopenia. I was unable to locate the uric acid level and therefore we will call Dr. Detterding's office to see if we can get this result. I started her on prednisone 5 mg tabs once daily. I will see her back in one week for followup at that time a repeat uric acid level will likely be ordered.

## 2013-10-29 ENCOUNTER — Other Ambulatory Visit: Payer: Self-pay | Admitting: Family Medicine

## 2013-10-31 ENCOUNTER — Telehealth: Payer: Self-pay | Admitting: *Deleted

## 2013-10-31 NOTE — Telephone Encounter (Signed)
Message copied by Lolita Rieger on Mon Oct 31, 2013  2:46 PM ------      Message from: Bronson Ing      Created: Fri Oct 28, 2013  3:17 PM      Regarding: uric acid lab       Can you call Dr. Brayton Caves office and ask them to fax over a copy of most recent uric acid level?  Its not in epic that i can tell.  Thanks!! ------

## 2013-10-31 NOTE — Telephone Encounter (Signed)
Per Dr. Valentina Lucks, I called Dr. Deterding's office and spoke to Jan.  She said that her last lab was done on 10/24/2013 and it was 9.3.  I asked her to fax it.

## 2013-11-01 DIAGNOSIS — L940 Localized scleroderma [morphea]: Secondary | ICD-10-CM | POA: Diagnosis not present

## 2013-11-02 ENCOUNTER — Telehealth: Payer: Self-pay | Admitting: Family Medicine

## 2013-11-02 NOTE — Telephone Encounter (Signed)
Error

## 2013-11-09 ENCOUNTER — Ambulatory Visit: Payer: Medicare Other | Admitting: Podiatrist

## 2013-11-15 ENCOUNTER — Telehealth: Payer: Self-pay | Admitting: Cardiology

## 2013-11-15 ENCOUNTER — Encounter: Payer: Self-pay | Admitting: Internal Medicine

## 2013-11-15 ENCOUNTER — Ambulatory Visit (INDEPENDENT_AMBULATORY_CARE_PROVIDER_SITE_OTHER): Payer: Medicare Other | Admitting: *Deleted

## 2013-11-15 DIAGNOSIS — I498 Other specified cardiac arrhythmias: Secondary | ICD-10-CM

## 2013-11-15 LAB — MDC_IDC_ENUM_SESS_TYPE_REMOTE
Battery Remaining Longevity: 100 mo
Battery Voltage: 2.79 V
Brady Statistic AP VS Percent: 98 %
Date Time Interrogation Session: 20150526212423
Lead Channel Impedance Value: 606 Ohm
Lead Channel Pacing Threshold Amplitude: 0.625 V
Lead Channel Pacing Threshold Amplitude: 0.625 V
Lead Channel Pacing Threshold Pulse Width: 0.4 ms
Lead Channel Setting Pacing Amplitude: 2 V
Lead Channel Setting Pacing Amplitude: 2.5 V
Lead Channel Setting Pacing Pulse Width: 0.4 ms
MDC IDC MSMT BATTERY IMPEDANCE: 220 Ohm
MDC IDC MSMT LEADCHNL RA IMPEDANCE VALUE: 393 Ohm
MDC IDC MSMT LEADCHNL RA PACING THRESHOLD PULSEWIDTH: 0.4 ms
MDC IDC MSMT LEADCHNL RV SENSING INTR AMPL: 22.4 mV
MDC IDC SET LEADCHNL RV SENSING SENSITIVITY: 4 mV
MDC IDC STAT BRADY AP VP PERCENT: 1 %
MDC IDC STAT BRADY AS VP PERCENT: 0 %
MDC IDC STAT BRADY AS VS PERCENT: 0 %

## 2013-11-15 NOTE — Progress Notes (Signed)
Remote pacemaker transmission.   

## 2013-11-15 NOTE — Telephone Encounter (Signed)
Spoke with pt and reminded pt of remote transmission that is due today. Pt verbalized understanding.   

## 2013-11-21 ENCOUNTER — Other Ambulatory Visit: Payer: Self-pay | Admitting: Family Medicine

## 2013-12-02 ENCOUNTER — Encounter: Payer: Self-pay | Admitting: Cardiology

## 2013-12-26 ENCOUNTER — Other Ambulatory Visit: Payer: Self-pay | Admitting: Family Medicine

## 2014-01-23 ENCOUNTER — Encounter: Payer: Self-pay | Admitting: Internal Medicine

## 2014-01-23 ENCOUNTER — Ambulatory Visit (INDEPENDENT_AMBULATORY_CARE_PROVIDER_SITE_OTHER): Payer: Medicare Other | Admitting: Internal Medicine

## 2014-01-23 VITALS — BP 116/70 | HR 78 | Ht 60.0 in | Wt 152.0 lb

## 2014-01-23 DIAGNOSIS — I459 Conduction disorder, unspecified: Secondary | ICD-10-CM | POA: Diagnosis not present

## 2014-01-23 DIAGNOSIS — Z950 Presence of cardiac pacemaker: Secondary | ICD-10-CM

## 2014-01-23 DIAGNOSIS — I2510 Atherosclerotic heart disease of native coronary artery without angina pectoris: Secondary | ICD-10-CM | POA: Diagnosis not present

## 2014-01-23 LAB — MDC_IDC_ENUM_SESS_TYPE_INCLINIC
Battery Impedance: 221 Ohm
Brady Statistic AP VP Percent: 1 %
Brady Statistic AP VS Percent: 99 %
Brady Statistic AS VS Percent: 0 %
Date Time Interrogation Session: 20150803110055
Lead Channel Impedance Value: 664 Ohm
Lead Channel Pacing Threshold Amplitude: 0.5 V
Lead Channel Pacing Threshold Pulse Width: 0.4 ms
Lead Channel Pacing Threshold Pulse Width: 0.4 ms
Lead Channel Setting Pacing Amplitude: 2.5 V
MDC IDC MSMT BATTERY REMAINING LONGEVITY: 100 mo
MDC IDC MSMT BATTERY VOLTAGE: 2.79 V
MDC IDC MSMT LEADCHNL RA IMPEDANCE VALUE: 393 Ohm
MDC IDC MSMT LEADCHNL RA PACING THRESHOLD AMPLITUDE: 0.5 V
MDC IDC MSMT LEADCHNL RV SENSING INTR AMPL: 11.2 mV
MDC IDC SET LEADCHNL RA PACING AMPLITUDE: 2 V
MDC IDC SET LEADCHNL RV PACING PULSEWIDTH: 0.4 ms
MDC IDC SET LEADCHNL RV SENSING SENSITIVITY: 4 mV
MDC IDC STAT BRADY AS VP PERCENT: 0 %

## 2014-01-23 NOTE — Patient Instructions (Signed)
Your physician recommends that you schedule a follow-up appointment in: 12 months with Dr Knox Saliva will receive a reminder letter two months in advance reminding you to call and schedule your appointment. If you don't receive this letter, please contact our office.

## 2014-01-23 NOTE — Assessment & Plan Note (Signed)
She denies anginal symptoms. No change in meds. 
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
## 2014-01-23 NOTE — Assessment & Plan Note (Signed)
Her Medtronic DDD PM is working normally. Will recheck in several months. 

## 2014-01-25 DIAGNOSIS — M109 Gout, unspecified: Secondary | ICD-10-CM | POA: Diagnosis not present

## 2014-01-25 DIAGNOSIS — D631 Anemia in chronic kidney disease: Secondary | ICD-10-CM | POA: Diagnosis not present

## 2014-01-25 DIAGNOSIS — N2581 Secondary hyperparathyroidism of renal origin: Secondary | ICD-10-CM | POA: Diagnosis not present

## 2014-01-25 DIAGNOSIS — N184 Chronic kidney disease, stage 4 (severe): Secondary | ICD-10-CM | POA: Diagnosis not present

## 2014-02-17 ENCOUNTER — Other Ambulatory Visit: Payer: Self-pay | Admitting: Cardiology

## 2014-02-23 ENCOUNTER — Other Ambulatory Visit: Payer: Self-pay | Admitting: Family Medicine

## 2014-04-07 DIAGNOSIS — Z23 Encounter for immunization: Secondary | ICD-10-CM | POA: Diagnosis not present

## 2014-04-26 ENCOUNTER — Telehealth: Payer: Self-pay | Admitting: Cardiology

## 2014-04-26 ENCOUNTER — Other Ambulatory Visit: Payer: Self-pay | Admitting: Family Medicine

## 2014-04-26 ENCOUNTER — Ambulatory Visit (INDEPENDENT_AMBULATORY_CARE_PROVIDER_SITE_OTHER): Payer: Medicare Other | Admitting: *Deleted

## 2014-04-26 DIAGNOSIS — I498 Other specified cardiac arrhythmias: Secondary | ICD-10-CM | POA: Diagnosis not present

## 2014-04-26 LAB — MDC_IDC_ENUM_SESS_TYPE_REMOTE
Battery Impedance: 269 Ohm
Battery Remaining Longevity: 102 mo
Battery Voltage: 2.78 V
Brady Statistic AP VP Percent: 2 %
Brady Statistic AS VS Percent: 0 %
Date Time Interrogation Session: 20151104180425
Lead Channel Impedance Value: 388 Ohm
Lead Channel Impedance Value: 580 Ohm
Lead Channel Setting Pacing Amplitude: 2 V
Lead Channel Setting Pacing Amplitude: 3.25 V
Lead Channel Setting Sensing Sensitivity: 4 mV
MDC IDC MSMT LEADCHNL RA PACING THRESHOLD AMPLITUDE: 0.625 V
MDC IDC MSMT LEADCHNL RA PACING THRESHOLD PULSEWIDTH: 0.4 ms
MDC IDC MSMT LEADCHNL RV PACING THRESHOLD AMPLITUDE: 0.625 V
MDC IDC MSMT LEADCHNL RV PACING THRESHOLD PULSEWIDTH: 0.4 ms
MDC IDC MSMT LEADCHNL RV SENSING INTR AMPL: 8 mV
MDC IDC SET LEADCHNL RV PACING PULSEWIDTH: 0.52 ms
MDC IDC STAT BRADY AP VS PERCENT: 98 %
MDC IDC STAT BRADY AS VP PERCENT: 0 %

## 2014-04-26 NOTE — Telephone Encounter (Signed)
Spoke with pt and reminded pt of remote transmission that is due today. Pt verbalized understanding.   

## 2014-04-26 NOTE — Progress Notes (Signed)
Remote pacemaker transmission.   

## 2014-05-10 ENCOUNTER — Encounter: Payer: Self-pay | Admitting: Cardiology

## 2014-05-24 ENCOUNTER — Other Ambulatory Visit: Payer: Self-pay | Admitting: Family Medicine

## 2014-05-26 ENCOUNTER — Encounter: Payer: Self-pay | Admitting: Internal Medicine

## 2014-05-26 DIAGNOSIS — H1132 Conjunctival hemorrhage, left eye: Secondary | ICD-10-CM | POA: Diagnosis not present

## 2014-06-24 ENCOUNTER — Other Ambulatory Visit: Payer: Self-pay | Admitting: Family Medicine

## 2014-06-27 DIAGNOSIS — N184 Chronic kidney disease, stage 4 (severe): Secondary | ICD-10-CM | POA: Diagnosis not present

## 2014-06-27 DIAGNOSIS — D631 Anemia in chronic kidney disease: Secondary | ICD-10-CM | POA: Diagnosis not present

## 2014-06-27 DIAGNOSIS — I129 Hypertensive chronic kidney disease with stage 1 through stage 4 chronic kidney disease, or unspecified chronic kidney disease: Secondary | ICD-10-CM | POA: Diagnosis not present

## 2014-06-27 DIAGNOSIS — N189 Chronic kidney disease, unspecified: Secondary | ICD-10-CM | POA: Diagnosis not present

## 2014-06-27 DIAGNOSIS — N2581 Secondary hyperparathyroidism of renal origin: Secondary | ICD-10-CM | POA: Diagnosis not present

## 2014-06-27 DIAGNOSIS — M109 Gout, unspecified: Secondary | ICD-10-CM | POA: Diagnosis not present

## 2014-07-03 ENCOUNTER — Other Ambulatory Visit: Payer: Self-pay | Admitting: Family Medicine

## 2014-07-27 ENCOUNTER — Ambulatory Visit (INDEPENDENT_AMBULATORY_CARE_PROVIDER_SITE_OTHER): Payer: Medicare Other | Admitting: *Deleted

## 2014-07-27 ENCOUNTER — Encounter: Payer: Self-pay | Admitting: Internal Medicine

## 2014-07-27 ENCOUNTER — Other Ambulatory Visit: Payer: Self-pay | Admitting: Family Medicine

## 2014-07-27 DIAGNOSIS — I498 Other specified cardiac arrhythmias: Secondary | ICD-10-CM | POA: Diagnosis not present

## 2014-07-27 LAB — MDC_IDC_ENUM_SESS_TYPE_REMOTE
Battery Impedance: 318 Ohm
Battery Remaining Longevity: 90 mo
Brady Statistic AP VP Percent: 1 %
Date Time Interrogation Session: 20160204152519
Lead Channel Impedance Value: 383 Ohm
Lead Channel Impedance Value: 631 Ohm
Lead Channel Pacing Threshold Amplitude: 0.625 V
Lead Channel Pacing Threshold Amplitude: 0.625 V
Lead Channel Sensing Intrinsic Amplitude: 11.2 mV
Lead Channel Setting Pacing Amplitude: 2 V
Lead Channel Setting Pacing Amplitude: 2.5 V
Lead Channel Setting Sensing Sensitivity: 4 mV
MDC IDC MSMT BATTERY VOLTAGE: 2.78 V
MDC IDC MSMT LEADCHNL RA PACING THRESHOLD PULSEWIDTH: 0.4 ms
MDC IDC MSMT LEADCHNL RV PACING THRESHOLD PULSEWIDTH: 0.4 ms
MDC IDC SET LEADCHNL RV PACING PULSEWIDTH: 0.4 ms
MDC IDC STAT BRADY AP VS PERCENT: 98 %
MDC IDC STAT BRADY AS VP PERCENT: 0 %
MDC IDC STAT BRADY AS VS PERCENT: 0 %

## 2014-07-27 NOTE — Progress Notes (Signed)
Remote pacemaker transmission.   

## 2014-07-31 ENCOUNTER — Other Ambulatory Visit: Payer: Self-pay | Admitting: Family Medicine

## 2014-08-10 ENCOUNTER — Encounter: Payer: Self-pay | Admitting: Cardiology

## 2014-08-14 DIAGNOSIS — H26493 Other secondary cataract, bilateral: Secondary | ICD-10-CM | POA: Diagnosis not present

## 2014-08-25 ENCOUNTER — Other Ambulatory Visit: Payer: Self-pay | Admitting: Family Medicine

## 2014-09-08 ENCOUNTER — Telehealth: Payer: Self-pay | Admitting: *Deleted

## 2014-09-08 ENCOUNTER — Ambulatory Visit (INDEPENDENT_AMBULATORY_CARE_PROVIDER_SITE_OTHER): Payer: Medicare Other | Admitting: *Deleted

## 2014-09-08 DIAGNOSIS — E039 Hypothyroidism, unspecified: Secondary | ICD-10-CM

## 2014-09-08 DIAGNOSIS — Z23 Encounter for immunization: Secondary | ICD-10-CM

## 2014-09-08 DIAGNOSIS — Z79899 Other long term (current) drug therapy: Secondary | ICD-10-CM

## 2014-09-08 DIAGNOSIS — E785 Hyperlipidemia, unspecified: Secondary | ICD-10-CM

## 2014-09-08 DIAGNOSIS — D649 Anemia, unspecified: Secondary | ICD-10-CM

## 2014-09-08 NOTE — Telephone Encounter (Signed)
Does pt need bloodwork before appt. Last labs 08/2012 bmp, cbc

## 2014-09-08 NOTE — Telephone Encounter (Signed)
Orders ready. Pt's daughter notified.

## 2014-09-08 NOTE — Telephone Encounter (Signed)
CBC, lipid, liver, metabolic 7, TSH-hypothyroidism, hyperlipidemia, HTN, anemia

## 2014-09-12 ENCOUNTER — Encounter: Payer: Self-pay | Admitting: *Deleted

## 2014-09-25 ENCOUNTER — Other Ambulatory Visit: Payer: Self-pay | Admitting: Family Medicine

## 2014-10-02 ENCOUNTER — Other Ambulatory Visit: Payer: Self-pay | Admitting: Family Medicine

## 2014-10-09 ENCOUNTER — Ambulatory Visit: Payer: PRIVATE HEALTH INSURANCE | Admitting: Family Medicine

## 2014-10-10 ENCOUNTER — Other Ambulatory Visit: Payer: Self-pay | Admitting: Family Medicine

## 2014-10-10 NOTE — Telephone Encounter (Signed)
1 refill send card needs follow-up

## 2014-10-11 ENCOUNTER — Ambulatory Visit (INDEPENDENT_AMBULATORY_CARE_PROVIDER_SITE_OTHER): Payer: Medicare Other | Admitting: Family Medicine

## 2014-10-11 ENCOUNTER — Encounter: Payer: Self-pay | Admitting: Family Medicine

## 2014-10-11 VITALS — BP 136/84 | Ht 64.0 in | Wt 152.0 lb

## 2014-10-11 DIAGNOSIS — R0602 Shortness of breath: Secondary | ICD-10-CM | POA: Diagnosis not present

## 2014-10-11 DIAGNOSIS — E038 Other specified hypothyroidism: Secondary | ICD-10-CM

## 2014-10-11 DIAGNOSIS — E785 Hyperlipidemia, unspecified: Secondary | ICD-10-CM | POA: Diagnosis not present

## 2014-10-11 DIAGNOSIS — M8580 Other specified disorders of bone density and structure, unspecified site: Secondary | ICD-10-CM

## 2014-10-11 DIAGNOSIS — N182 Chronic kidney disease, stage 2 (mild): Secondary | ICD-10-CM | POA: Diagnosis not present

## 2014-10-11 DIAGNOSIS — I10 Essential (primary) hypertension: Secondary | ICD-10-CM

## 2014-10-11 NOTE — Progress Notes (Signed)
   Subjective:    Patient ID: Alexandra Hall, female    DOB: 08-30-1921, 81 y.o.   MRN: [id number]  Hyperlipidemia This is a chronic problem. The current episode started more than 1 year ago.  pt getting short winded when walking fast.  Patient states she is taking medication she is try to watch her diet she does try to stay active she does sleep some during the day denies excessive fatigue or tightness she knows he needs to get lab work completed has not done so lately she does try to eat relatively healthy. She denies chest pressure tightness pain.   Review of Systems    denies high fever chills sweats denies cough nausea vomiting diarrhea Objective:   Physical Exam  Lungs are clear hearts regular pulses normal murmur noted  patient is able to walk she does have kyphosis she does have slight ataxia I did encourage her to use a cane with walking    Assessment & Plan:  Cardiac disease stable I believe her shortness of breath is she is not exercising she need to do a little walking each day and I believe this will help her  2 she is your lab work. She has thyroid condition continue medication She is to continue her cholesterol medicine watch diet check lab work Ataxia she is using cane with walking to prevent falling Osteopenia bone density x-ray ordered await the test results Greater than 25 minutes spent with patient discussing these issues, [id number]   Shingles vaccine recommended.

## 2014-10-11 NOTE — Patient Instructions (Signed)
Please do your labs

## 2014-10-16 ENCOUNTER — Ambulatory Visit (HOSPITAL_COMMUNITY)
Admission: RE | Admit: 2014-10-16 | Discharge: 2014-10-16 | Disposition: A | Discharge status: Home / Self Care | Payer: Medicare Other | Source: Ambulatory Visit | Attending: Family Medicine | Admitting: Family Medicine

## 2014-10-16 DIAGNOSIS — M8580 Other specified disorders of bone density and structure, unspecified site: Secondary | ICD-10-CM | POA: Diagnosis not present

## 2014-10-16 DIAGNOSIS — M810 Age-related osteoporosis without current pathological fracture: Secondary | ICD-10-CM | POA: Diagnosis not present

## 2014-10-18 ENCOUNTER — Other Ambulatory Visit: Payer: Self-pay | Admitting: *Deleted

## 2014-10-18 MED ORDER — ALENDRONATE SODIUM 70 MG PO TABS: 70.0000 mg | ORAL_TABLET | ORAL | Status: DC

## 2014-10-23 ENCOUNTER — Other Ambulatory Visit: Payer: Self-pay | Admitting: Family Medicine

## 2014-10-30 ENCOUNTER — Ambulatory Visit (INDEPENDENT_AMBULATORY_CARE_PROVIDER_SITE_OTHER): Payer: Medicare Other | Admitting: *Deleted

## 2014-10-30 DIAGNOSIS — I498 Other specified cardiac arrhythmias: Secondary | ICD-10-CM

## 2014-10-30 LAB — CUP PACEART REMOTE DEVICE CHECK
Battery Impedance: 368 Ohm
Battery Remaining Longevity: 76 mo
Brady Statistic AP VP Percent: 1 %
Brady Statistic AP VS Percent: 96 %
Lead Channel Impedance Value: 393 Ohm
Lead Channel Impedance Value: 600 Ohm
Lead Channel Pacing Threshold Pulse Width: 0.4 ms
Lead Channel Setting Pacing Amplitude: 2 V
Lead Channel Setting Pacing Pulse Width: 1 ms
Lead Channel Setting Sensing Sensitivity: 4 mV
MDC IDC MSMT BATTERY VOLTAGE: 2.77 V
MDC IDC MSMT LEADCHNL RA PACING THRESHOLD AMPLITUDE: 0.5 V
MDC IDC MSMT LEADCHNL RV SENSING INTR AMPL: 16 mV
MDC IDC SESS DTM: 20160509141228
MDC IDC SET LEADCHNL RV PACING AMPLITUDE: 5 V
MDC IDC STAT BRADY AS VP PERCENT: 0 %
MDC IDC STAT BRADY AS VS PERCENT: 2 %

## 2014-10-30 NOTE — Progress Notes (Signed)
Remote pacemaker transmission.   

## 2014-10-31 ENCOUNTER — Other Ambulatory Visit: Payer: Self-pay | Admitting: Family Medicine

## 2014-11-03 DIAGNOSIS — I129 Hypertensive chronic kidney disease with stage 1 through stage 4 chronic kidney disease, or unspecified chronic kidney disease: Secondary | ICD-10-CM | POA: Diagnosis not present

## 2014-11-03 DIAGNOSIS — N184 Chronic kidney disease, stage 4 (severe): Secondary | ICD-10-CM | POA: Diagnosis not present

## 2014-11-03 DIAGNOSIS — N2581 Secondary hyperparathyroidism of renal origin: Secondary | ICD-10-CM | POA: Diagnosis not present

## 2014-11-03 DIAGNOSIS — D631 Anemia in chronic kidney disease: Secondary | ICD-10-CM | POA: Diagnosis not present

## 2014-11-03 DIAGNOSIS — M1020 Drug-induced gout, unspecified site: Secondary | ICD-10-CM | POA: Diagnosis not present

## 2014-11-03 LAB — CUP PACEART REMOTE DEVICE CHECK
Battery Impedance: 368 Ohm
Battery Remaining Longevity: 76 mo
Brady Statistic AP VP Percent: 1 %
Brady Statistic AP VS Percent: 96 %
Brady Statistic AS VP Percent: 0 %
Date Time Interrogation Session: 20160509141228
Lead Channel Impedance Value: 600 Ohm
Lead Channel Pacing Threshold Amplitude: 0.5 V
Lead Channel Pacing Threshold Pulse Width: 0.4 ms
Lead Channel Sensing Intrinsic Amplitude: 16 mV
Lead Channel Setting Pacing Amplitude: 2 V
Lead Channel Setting Pacing Amplitude: 5 V
Lead Channel Setting Pacing Pulse Width: 1 ms
Lead Channel Setting Sensing Sensitivity: 4 mV
MDC IDC MSMT BATTERY VOLTAGE: 2.77 V
MDC IDC MSMT LEADCHNL RA IMPEDANCE VALUE: 393 Ohm
MDC IDC STAT BRADY AS VS PERCENT: 2 %

## 2014-11-24 ENCOUNTER — Encounter: Payer: Self-pay | Admitting: Cardiology

## 2014-11-30 ENCOUNTER — Encounter: Payer: Self-pay | Admitting: Internal Medicine

## 2015-01-08 DIAGNOSIS — Z961 Presence of intraocular lens: Secondary | ICD-10-CM | POA: Diagnosis not present

## 2015-01-08 DIAGNOSIS — H5203 Hypermetropia, bilateral: Secondary | ICD-10-CM | POA: Diagnosis not present

## 2015-01-08 DIAGNOSIS — Z9849 Cataract extraction status, unspecified eye: Secondary | ICD-10-CM | POA: Diagnosis not present

## 2015-01-08 DIAGNOSIS — H43813 Vitreous degeneration, bilateral: Secondary | ICD-10-CM | POA: Diagnosis not present

## 2015-01-09 ENCOUNTER — Other Ambulatory Visit: Payer: Self-pay | Admitting: Family Medicine

## 2015-01-17 ENCOUNTER — Other Ambulatory Visit: Payer: Self-pay | Admitting: Cardiology

## 2015-03-15 ENCOUNTER — Other Ambulatory Visit: Payer: Self-pay | Admitting: Cardiology

## 2015-03-22 ENCOUNTER — Other Ambulatory Visit: Payer: Self-pay | Admitting: Family Medicine

## 2015-03-23 ENCOUNTER — Encounter: Payer: Self-pay | Admitting: Internal Medicine

## 2015-03-23 ENCOUNTER — Ambulatory Visit (INDEPENDENT_AMBULATORY_CARE_PROVIDER_SITE_OTHER): Payer: Medicare Other | Admitting: Internal Medicine

## 2015-03-23 VITALS — BP 132/80 | HR 101 | Ht 62.0 in | Wt 151.4 lb

## 2015-03-23 DIAGNOSIS — I495 Sick sinus syndrome: Secondary | ICD-10-CM

## 2015-03-23 DIAGNOSIS — I10 Essential (primary) hypertension: Secondary | ICD-10-CM | POA: Diagnosis not present

## 2015-03-23 DIAGNOSIS — I2510 Atherosclerotic heart disease of native coronary artery without angina pectoris: Secondary | ICD-10-CM | POA: Diagnosis not present

## 2015-03-23 DIAGNOSIS — E785 Hyperlipidemia, unspecified: Secondary | ICD-10-CM | POA: Diagnosis not present

## 2015-03-23 DIAGNOSIS — Z950 Presence of cardiac pacemaker: Secondary | ICD-10-CM | POA: Diagnosis not present

## 2015-03-23 LAB — CUP PACEART INCLINIC DEVICE CHECK
Battery Remaining Longevity: 81 mo
Battery Voltage: 2.78 V
Brady Statistic AP VS Percent: 96 %
Date Time Interrogation Session: 20160930123759
Lead Channel Impedance Value: 612 Ohm
Lead Channel Pacing Threshold Amplitude: 0.5 V
Lead Channel Pacing Threshold Pulse Width: 0.4 ms
Lead Channel Pacing Threshold Pulse Width: 0.4 ms
Lead Channel Sensing Intrinsic Amplitude: 11.2 mV
Lead Channel Setting Pacing Amplitude: 2 V
Lead Channel Setting Pacing Amplitude: 2.5 V
Lead Channel Setting Sensing Sensitivity: 4 mV
MDC IDC MSMT BATTERY IMPEDANCE: 441 Ohm
MDC IDC MSMT LEADCHNL RA IMPEDANCE VALUE: 393 Ohm
MDC IDC MSMT LEADCHNL RA PACING THRESHOLD AMPLITUDE: 0.75 V
MDC IDC SET LEADCHNL RV PACING PULSEWIDTH: 0.4 ms
MDC IDC STAT BRADY AP VP PERCENT: 1 %
MDC IDC STAT BRADY AS VP PERCENT: 0 %
MDC IDC STAT BRADY AS VS PERCENT: 2 %

## 2015-03-23 NOTE — Assessment & Plan Note (Signed)
Her medtronic DDD PM is working normally. Will recheck in several months. 

## 2015-03-23 NOTE — Patient Instructions (Signed)
Your physician wants you to follow-up in: 1 Year with Dr. Lovena Le. You will receive a reminder letter in the mail two months in advance. If you don't receive a letter, please call our office to schedule the follow-up appointment.  Remote monitoring is used to monitor your Pacemaker of ICD from home. This monitoring reduces the number of office visits required to check your device to one time per year. It allows [place] to keep an eye on the functioning of your device to ensure it is working properly. You are scheduled for a device check from home on 06/26/15. You may send your transmission at any time that day. If you have a wireless device, the transmission will be sent automatically. After your physician reviews your transmission, you will receive a postcard with your next transmission date.  Your physician recommends that you continue on your current medications as directed. Please refer to the Current Medication list given to you today.  Thank you for choosing [facility]!

## 2015-03-23 NOTE — Assessment & Plan Note (Signed)
She is sedentary but denies anginal symptoms. Will follow.
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
## 2015-03-23 NOTE — Assessment & Plan Note (Signed)
She will maintain her statin. Will follow.

## 2015-04-05 ENCOUNTER — Other Ambulatory Visit: Payer: Self-pay | Admitting: Family Medicine

## 2015-04-19 DIAGNOSIS — Z23 Encounter for immunization: Secondary | ICD-10-CM | POA: Diagnosis not present

## 2015-05-15 ENCOUNTER — Other Ambulatory Visit: Payer: Self-pay | Admitting: Cardiology

## 2015-05-16 NOTE — Telephone Encounter (Signed)
Ok to refill under Dr Lovena Le? Looks like it was originally prescribed by Dr Stanford Breed but patient has not seen him in a while, but just saw Dr Lovena Le. Please advise. Thanks, MI

## 2015-05-16 NOTE — Telephone Encounter (Signed)
Ok to fill 

## 2015-05-21 ENCOUNTER — Other Ambulatory Visit: Payer: Self-pay | Admitting: Family Medicine

## 2015-06-06 ENCOUNTER — Other Ambulatory Visit: Payer: Self-pay | Admitting: Family Medicine

## 2015-06-21 ENCOUNTER — Other Ambulatory Visit: Payer: Self-pay | Admitting: Family Medicine

## 2015-06-26 ENCOUNTER — Telehealth: Payer: Self-pay | Admitting: Family Medicine

## 2015-06-26 ENCOUNTER — Ambulatory Visit (INDEPENDENT_AMBULATORY_CARE_PROVIDER_SITE_OTHER): Payer: Medicare Other | Admitting: *Deleted

## 2015-06-26 ENCOUNTER — Telehealth: Payer: Self-pay | Admitting: Cardiology

## 2015-06-26 DIAGNOSIS — I495 Sick sinus syndrome: Secondary | ICD-10-CM | POA: Diagnosis not present

## 2015-06-26 MED ORDER — LEVOTHYROXINE SODIUM 88 MCG PO TABS: ORAL_TABLET | ORAL | Status: DC

## 2015-06-26 NOTE — Telephone Encounter (Signed)
Confirmed remote transmission w/ pt daughter.   

## 2015-06-26 NOTE — Telephone Encounter (Signed)
Refill sent to pharmacy. Alexandra Hall was notified.

## 2015-06-26 NOTE — Telephone Encounter (Signed)
Pt is needing a refill on her levothyroxine. Pt has an appt for tomorrow, but has been out for three days.    [employer]

## 2015-06-27 ENCOUNTER — Encounter: Payer: Self-pay | Admitting: Family Medicine

## 2015-06-27 ENCOUNTER — Ambulatory Visit (INDEPENDENT_AMBULATORY_CARE_PROVIDER_SITE_OTHER): Payer: Medicare Other | Admitting: Family Medicine

## 2015-06-27 VITALS — BP 136/76 | Ht 64.0 in | Wt 154.8 lb

## 2015-06-27 DIAGNOSIS — D692 Other nonthrombocytopenic purpura: Secondary | ICD-10-CM | POA: Diagnosis not present

## 2015-06-27 DIAGNOSIS — E038 Other specified hypothyroidism: Secondary | ICD-10-CM | POA: Diagnosis not present

## 2015-06-27 DIAGNOSIS — E875 Hyperkalemia: Secondary | ICD-10-CM

## 2015-06-27 DIAGNOSIS — E785 Hyperlipidemia, unspecified: Secondary | ICD-10-CM | POA: Diagnosis not present

## 2015-06-27 DIAGNOSIS — I10 Essential (primary) hypertension: Secondary | ICD-10-CM | POA: Diagnosis not present

## 2015-06-27 NOTE — Progress Notes (Signed)
Remote pacemaker transmission.   

## 2015-06-27 NOTE — Progress Notes (Signed)
   Subjective:    Patient ID: Alexandra Hall, female    DOB: 30-Apr-1922, 82 y.o.   MRN: [id number]  Hypertension This is a chronic problem. The current episode started more than 1 year ago. Risk factors for coronary artery disease include dyslipidemia, obesity, post-menopausal state and sedentary lifestyle. Treatments tried: lasix and lopressor. There are no compliance problems.     she denies any chest tightness pressure pain or shortness of breath She relates taken her cholesterol medicine on a regular basis She admits she has not done a good job of getting her lab work done recently and states that she will do this She does have osteoporosis tolerating the medicine well not due for bone density currently She does have hypothyroidism she takes her medicine on a regular basis She does try to watch how she is she denies any rectal bleeding. Denies vaginal bleeding.   Review of Systems  patient states she's noted that her skin is becoming very fragile    Objective:   Physical Exam  lungs are clear severe kyphosis noted heart regular abdomen soft extremities no edema skin warm dry senile purpura noted on the arms minimal amount.  25 minutes was spent with the patient. Greater than half the time was spent in discussion and answering questions and counseling regarding the issues that the patient came in for today.      Assessment & Plan:  1. Essential hypertension  blood pressure good control continue current measures watch diet - Basic metabolic panel - Hepatic function panel - Lipid panel - TSH  2. Other specified hypothyroidism  thyroid good control continue current measures check lab work await the results - Basic metabolic panel - Hepatic function panel - Lipid panel - TSH  3. Hyperlipidemia  continue current medication watch diet closely check lab work await results - Basic metabolic panel - Hepatic function panel - Lipid panel - TSH  4. Senile purpura ([place])  care  regarding skin care was discussed in detail.   Osteoporosis tolerating medication not due for bone density new line patient encourage follow-up in 6 months but she has a  Old school philosophy if he of coming to the doctor may be once a year.

## 2015-07-04 ENCOUNTER — Encounter: Payer: Self-pay | Admitting: Cardiology

## 2015-07-04 LAB — CUP PACEART REMOTE DEVICE CHECK
Brady Statistic AP VS Percent: 98 %
Brady Statistic AS VP Percent: 0 %
Brady Statistic AS VS Percent: 0 %
Date Time Interrogation Session: 20170103220139
Implantable Lead Location: 753859
Implantable Lead Model: 5076
Lead Channel Impedance Value: 605 Ohm
Lead Channel Pacing Threshold Amplitude: 0.5 V
Lead Channel Pacing Threshold Pulse Width: 0.4 ms
Lead Channel Pacing Threshold Pulse Width: 0.4 ms
Lead Channel Sensing Intrinsic Amplitude: 8 mV
Lead Channel Setting Sensing Sensitivity: 4 mV
MDC IDC LEAD IMPLANT DT: 20050110
MDC IDC LEAD IMPLANT DT: 20050110
MDC IDC LEAD LOCATION: 753860
MDC IDC MSMT BATTERY IMPEDANCE: 517 Ohm
MDC IDC MSMT BATTERY REMAINING LONGEVITY: 75 mo
MDC IDC MSMT BATTERY VOLTAGE: 2.78 V
MDC IDC MSMT LEADCHNL RA IMPEDANCE VALUE: 388 Ohm
MDC IDC MSMT LEADCHNL RA PACING THRESHOLD AMPLITUDE: 0.625 V
MDC IDC SET LEADCHNL RA PACING AMPLITUDE: 2 V
MDC IDC SET LEADCHNL RV PACING AMPLITUDE: 2.5 V
MDC IDC SET LEADCHNL RV PACING PULSEWIDTH: 0.4 ms
MDC IDC STAT BRADY AP VP PERCENT: 2 %

## 2015-07-06 DIAGNOSIS — E038 Other specified hypothyroidism: Secondary | ICD-10-CM | POA: Diagnosis not present

## 2015-07-06 DIAGNOSIS — I10 Essential (primary) hypertension: Secondary | ICD-10-CM | POA: Diagnosis not present

## 2015-07-06 DIAGNOSIS — E785 Hyperlipidemia, unspecified: Secondary | ICD-10-CM | POA: Diagnosis not present

## 2015-07-07 LAB — LIPID PANEL
Chol/HDL Ratio: 3.9 ratio (ref 0.0–4.4)
Cholesterol, Total: 189 mg/dL (ref 100–199)
HDL: 49 mg/dL
LDL Calculated: 119 mg/dL — ABNORMAL HIGH (ref 0–99)
Triglycerides: 104 mg/dL (ref 0–149)
VLDL Cholesterol Cal: 21 mg/dL (ref 5–40)

## 2015-07-07 LAB — BASIC METABOLIC PANEL WITH GFR
BUN/Creatinine Ratio: 20 (ref 11–26)
BUN: 48 mg/dL — ABNORMAL HIGH (ref 10–36)
CO2: 21 mmol/L (ref 18–29)
Calcium: 9.1 mg/dL (ref 8.7–10.3)
Chloride: 101 mmol/L (ref 96–106)
Creatinine, Ser: 2.41 mg/dL — ABNORMAL HIGH (ref 0.57–1.00)
GFR calc Af Amer: 20 mL/min/1.73 — ABNORMAL LOW
GFR calc non Af Amer: 17 mL/min/1.73 — ABNORMAL LOW
Glucose: 92 mg/dL (ref 65–99)
Potassium: 5.3 mmol/L — ABNORMAL HIGH (ref 3.5–5.2)
Sodium: 142 mmol/L (ref 134–144)

## 2015-07-07 LAB — HEPATIC FUNCTION PANEL
ALT: 11 IU/L (ref 0–32)
AST: 21 IU/L (ref 0–40)
Albumin: 3.8 g/dL (ref 3.2–4.6)
Alkaline Phosphatase: 105 IU/L (ref 39–117)
Bilirubin Total: 0.6 mg/dL (ref 0.0–1.2)
Bilirubin, Direct: 0.22 mg/dL (ref 0.00–0.40)
Total Protein: 6.2 g/dL (ref 6.0–8.5)

## 2015-07-07 LAB — TSH: TSH: 1.65 u[IU]/mL (ref 0.450–4.500)

## 2015-07-09 NOTE — Addendum Note (Signed)
Addended by: Dairl Ponder on: 07/09/2015 02:20 PM   Modules accepted: Orders, Medications

## 2015-07-23 ENCOUNTER — Other Ambulatory Visit: Payer: Self-pay | Admitting: Family Medicine

## 2015-08-06 DIAGNOSIS — E875 Hyperkalemia: Secondary | ICD-10-CM | POA: Diagnosis not present

## 2015-08-07 LAB — BASIC METABOLIC PANEL
BUN/Creatinine Ratio: 19 (ref 11–26)
BUN: 45 mg/dL — ABNORMAL HIGH (ref 10–36)
CO2: 24 mmol/L (ref 18–29)
CREATININE: 2.41 mg/dL — AB (ref 0.57–1.00)
Calcium: 9.1 mg/dL (ref 8.7–10.3)
Chloride: 103 mmol/L (ref 96–106)
GFR calc Af Amer: 20 mL/min/{1.73_m2} — ABNORMAL LOW (ref >59)
GFR calc non Af Amer: 17 mL/min/{1.73_m2} — ABNORMAL LOW (ref >59)
GLUCOSE: 96 mg/dL (ref 65–99)
POTASSIUM: 4.4 mmol/L (ref 3.5–5.2)
SODIUM: 144 mmol/L (ref 134–144)

## 2015-08-10 DIAGNOSIS — M109 Gout, unspecified: Secondary | ICD-10-CM | POA: Diagnosis not present

## 2015-08-10 DIAGNOSIS — I129 Hypertensive chronic kidney disease with stage 1 through stage 4 chronic kidney disease, or unspecified chronic kidney disease: Secondary | ICD-10-CM | POA: Diagnosis not present

## 2015-08-10 DIAGNOSIS — I38 Endocarditis, valve unspecified: Secondary | ICD-10-CM | POA: Diagnosis not present

## 2015-08-10 DIAGNOSIS — D631 Anemia in chronic kidney disease: Secondary | ICD-10-CM | POA: Diagnosis not present

## 2015-08-10 DIAGNOSIS — I25810 Atherosclerosis of coronary artery bypass graft(s) without angina pectoris: Secondary | ICD-10-CM | POA: Diagnosis not present

## 2015-08-10 DIAGNOSIS — N2581 Secondary hyperparathyroidism of renal origin: Secondary | ICD-10-CM | POA: Diagnosis not present

## 2015-08-10 DIAGNOSIS — N184 Chronic kidney disease, stage 4 (severe): Secondary | ICD-10-CM | POA: Diagnosis not present

## 2015-08-10 DIAGNOSIS — E782 Mixed hyperlipidemia: Secondary | ICD-10-CM | POA: Diagnosis not present

## 2015-08-10 DIAGNOSIS — E039 Hypothyroidism, unspecified: Secondary | ICD-10-CM | POA: Diagnosis not present

## 2015-08-11 ENCOUNTER — Other Ambulatory Visit: Payer: Self-pay | Admitting: Family Medicine

## 2015-08-14 DIAGNOSIS — N390 Urinary tract infection, site not specified: Secondary | ICD-10-CM | POA: Diagnosis not present

## 2015-09-21 ENCOUNTER — Other Ambulatory Visit: Payer: Self-pay | Admitting: Family Medicine

## 2015-09-25 ENCOUNTER — Telehealth: Payer: Self-pay | Admitting: Cardiology

## 2015-09-25 ENCOUNTER — Ambulatory Visit (INDEPENDENT_AMBULATORY_CARE_PROVIDER_SITE_OTHER): Payer: Medicare Other | Admitting: *Deleted

## 2015-09-25 DIAGNOSIS — I495 Sick sinus syndrome: Secondary | ICD-10-CM | POA: Diagnosis not present

## 2015-09-25 NOTE — Telephone Encounter (Signed)
Confirmed remote transmission w/ pt daughter.   

## 2015-09-25 NOTE — Progress Notes (Signed)
Remote pacemaker transmission.   

## 2015-11-07 LAB — CUP PACEART REMOTE DEVICE CHECK
Battery Remaining Longevity: 70 mo
Battery Voltage: 2.78 V
Implantable Lead Implant Date: 20050110
Implantable Lead Model: 5076
Implantable Lead Model: 5076
Lead Channel Impedance Value: 382 Ohm
Lead Channel Impedance Value: 586 Ohm
Lead Channel Pacing Threshold Amplitude: 0.625 V
Lead Channel Pacing Threshold Pulse Width: 0.4 ms
Lead Channel Pacing Threshold Pulse Width: 0.4 ms
Lead Channel Setting Pacing Amplitude: 2.5 V
Lead Channel Setting Sensing Sensitivity: 4 mV
MDC IDC LEAD IMPLANT DT: 20050110
MDC IDC LEAD LOCATION: 753859
MDC IDC LEAD LOCATION: 753860
MDC IDC MSMT BATTERY IMPEDANCE: 592 Ohm
MDC IDC MSMT LEADCHNL RV PACING THRESHOLD AMPLITUDE: 0.625 V
MDC IDC MSMT LEADCHNL RV SENSING INTR AMPL: 8 mV
MDC IDC SESS DTM: 20170404155249
MDC IDC SET LEADCHNL RA PACING AMPLITUDE: 2 V
MDC IDC SET LEADCHNL RV PACING PULSEWIDTH: 0.4 ms
MDC IDC STAT BRADY AP VP PERCENT: 2 %
MDC IDC STAT BRADY AP VS PERCENT: 98 %
MDC IDC STAT BRADY AS VP PERCENT: 0 %
MDC IDC STAT BRADY AS VS PERCENT: 0 %

## 2015-11-09 ENCOUNTER — Encounter: Payer: Self-pay | Admitting: Cardiology

## 2015-12-22 ENCOUNTER — Other Ambulatory Visit: Payer: Self-pay | Admitting: Family Medicine

## 2015-12-26 ENCOUNTER — Ambulatory Visit: Payer: Medicare Other | Admitting: *Deleted

## 2015-12-26 ENCOUNTER — Ambulatory Visit (INDEPENDENT_AMBULATORY_CARE_PROVIDER_SITE_OTHER): Payer: Medicare Other | Admitting: *Deleted

## 2015-12-26 ENCOUNTER — Telehealth: Payer: Self-pay | Admitting: Cardiology

## 2015-12-26 DIAGNOSIS — I495 Sick sinus syndrome: Secondary | ICD-10-CM | POA: Diagnosis not present

## 2015-12-26 LAB — CUP PACEART REMOTE DEVICE CHECK
Battery Impedance: 668 Ohm
Battery Voltage: 2.78 V
Brady Statistic AP VP Percent: 2 %
Brady Statistic AP VS Percent: 98 %
Brady Statistic AP VS Percent: 98 %
Brady Statistic AS VP Percent: 0 %
Brady Statistic AS VP Percent: 0 %
Date Time Interrogation Session: 20170705201334
Implantable Lead Implant Date: 20050110
Implantable Lead Implant Date: 20050110
Implantable Lead Implant Date: 20050110
Implantable Lead Location: 753859
Implantable Lead Location: 753860
Implantable Lead Model: 5076
Implantable Lead Model: 5076
Implantable Lead Model: 5076
Lead Channel Impedance Value: 398 Ohm
Lead Channel Impedance Value: 582 Ohm
Lead Channel Impedance Value: 582 Ohm
Lead Channel Pacing Threshold Amplitude: 0.625 V
Lead Channel Pacing Threshold Amplitude: 0.75 V
Lead Channel Pacing Threshold Pulse Width: 0.4 ms
Lead Channel Pacing Threshold Pulse Width: 0.4 ms
Lead Channel Pacing Threshold Pulse Width: 0.4 ms
Lead Channel Setting Pacing Amplitude: 2 V
Lead Channel Setting Pacing Amplitude: 2.5 V
Lead Channel Setting Pacing Pulse Width: 0.4 ms
Lead Channel Setting Sensing Sensitivity: 2 mV
MDC IDC LEAD IMPLANT DT: 20050110
MDC IDC LEAD LOCATION: 753859
MDC IDC LEAD LOCATION: 753860
MDC IDC MSMT BATTERY IMPEDANCE: 668 Ohm
MDC IDC MSMT BATTERY REMAINING LONGEVITY: 67 mo
MDC IDC MSMT BATTERY REMAINING LONGEVITY: 67 mo
MDC IDC MSMT BATTERY VOLTAGE: 2.78 V
MDC IDC MSMT LEADCHNL RA IMPEDANCE VALUE: 398 Ohm
MDC IDC MSMT LEADCHNL RA PACING THRESHOLD AMPLITUDE: 0.75 V
MDC IDC MSMT LEADCHNL RV PACING THRESHOLD AMPLITUDE: 0.625 V
MDC IDC MSMT LEADCHNL RV PACING THRESHOLD PULSEWIDTH: 0.4 ms
MDC IDC MSMT LEADCHNL RV SENSING INTR AMPL: 5.6 mV
MDC IDC MSMT LEADCHNL RV SENSING INTR AMPL: 5.6 mV
MDC IDC SESS DTM: 20170705201334
MDC IDC SET LEADCHNL RA PACING AMPLITUDE: 2 V
MDC IDC SET LEADCHNL RV PACING AMPLITUDE: 2.5 V
MDC IDC SET LEADCHNL RV PACING PULSEWIDTH: 0.4 ms
MDC IDC SET LEADCHNL RV SENSING SENSITIVITY: 2 mV
MDC IDC STAT BRADY AP VP PERCENT: 2 %
MDC IDC STAT BRADY AS VS PERCENT: 0 %
MDC IDC STAT BRADY AS VS PERCENT: 0 %

## 2015-12-26 NOTE — Telephone Encounter (Signed)
Confirmed remote transmission w/ pt daughter.   

## 2015-12-28 ENCOUNTER — Encounter: Payer: Self-pay | Admitting: Cardiology

## 2016-01-07 ENCOUNTER — Encounter: Payer: Self-pay | Admitting: Cardiology

## 2016-01-07 LAB — CUP PACEART REMOTE DEVICE CHECK
Battery Impedance: 668 Ohm
Battery Remaining Longevity: 67 mo
Battery Voltage: 2.78 V
Brady Statistic AP VP Percent: 2 %
Brady Statistic AS VP Percent: 0 %
Date Time Interrogation Session: 20170705201334
Implantable Lead Implant Date: 20050110
Implantable Lead Location: 753859
Implantable Lead Location: 753860
Implantable Lead Model: 5076
Implantable Lead Model: 5076
Lead Channel Impedance Value: 398 Ohm
Lead Channel Pacing Threshold Amplitude: 0.625 V
Lead Channel Pacing Threshold Amplitude: 0.75 V
Lead Channel Pacing Threshold Pulse Width: 0.4 ms
Lead Channel Setting Pacing Amplitude: 2.5 V
Lead Channel Setting Pacing Pulse Width: 0.4 ms
MDC IDC LEAD IMPLANT DT: 20050110
MDC IDC MSMT LEADCHNL RV IMPEDANCE VALUE: 582 Ohm
MDC IDC MSMT LEADCHNL RV PACING THRESHOLD PULSEWIDTH: 0.4 ms
MDC IDC MSMT LEADCHNL RV SENSING INTR AMPL: 5.6 mV
MDC IDC SET LEADCHNL RA PACING AMPLITUDE: 2 V
MDC IDC SET LEADCHNL RV SENSING SENSITIVITY: 2 mV
MDC IDC STAT BRADY AP VS PERCENT: 98 %
MDC IDC STAT BRADY AS VS PERCENT: 0 %

## 2016-01-07 NOTE — Progress Notes (Signed)
Remote pacemaker transmission.   

## 2016-01-14 ENCOUNTER — Other Ambulatory Visit: Payer: Self-pay | Admitting: Family Medicine

## 2016-02-09 ENCOUNTER — Other Ambulatory Visit: Payer: Self-pay | Admitting: Internal Medicine

## 2016-02-09 ENCOUNTER — Other Ambulatory Visit: Payer: Self-pay | Admitting: Family Medicine

## 2016-02-26 ENCOUNTER — Other Ambulatory Visit: Payer: Self-pay

## 2016-02-26 DIAGNOSIS — I25810 Atherosclerosis of coronary artery bypass graft(s) without angina pectoris: Secondary | ICD-10-CM | POA: Diagnosis not present

## 2016-02-26 DIAGNOSIS — I129 Hypertensive chronic kidney disease with stage 1 through stage 4 chronic kidney disease, or unspecified chronic kidney disease: Secondary | ICD-10-CM | POA: Diagnosis not present

## 2016-02-26 DIAGNOSIS — E039 Hypothyroidism, unspecified: Secondary | ICD-10-CM | POA: Diagnosis not present

## 2016-02-26 DIAGNOSIS — I38 Endocarditis, valve unspecified: Secondary | ICD-10-CM | POA: Diagnosis not present

## 2016-02-26 DIAGNOSIS — M109 Gout, unspecified: Secondary | ICD-10-CM | POA: Diagnosis not present

## 2016-02-26 DIAGNOSIS — N184 Chronic kidney disease, stage 4 (severe): Secondary | ICD-10-CM | POA: Diagnosis not present

## 2016-02-26 DIAGNOSIS — M1020 Drug-induced gout, unspecified site: Secondary | ICD-10-CM | POA: Diagnosis not present

## 2016-02-26 DIAGNOSIS — N289 Disorder of kidney and ureter, unspecified: Secondary | ICD-10-CM | POA: Diagnosis not present

## 2016-02-26 DIAGNOSIS — D631 Anemia in chronic kidney disease: Secondary | ICD-10-CM | POA: Diagnosis not present

## 2016-02-26 DIAGNOSIS — E782 Mixed hyperlipidemia: Secondary | ICD-10-CM | POA: Diagnosis not present

## 2016-02-26 DIAGNOSIS — N2581 Secondary hyperparathyroidism of renal origin: Secondary | ICD-10-CM | POA: Diagnosis not present

## 2016-03-31 ENCOUNTER — Encounter: Payer: Self-pay | Admitting: Internal Medicine

## 2016-04-10 ENCOUNTER — Encounter: Payer: Medicare Other | Admitting: Internal Medicine

## 2016-04-14 ENCOUNTER — Telehealth: Payer: Self-pay | Admitting: Family Medicine

## 2016-04-14 NOTE — Telephone Encounter (Signed)
Patient is due to have dental work done.  The dentist is requiring that we prescribe an antibiotic before anything can be scheduled.  Her daughter says this is routine for a heart patient.   [employer]

## 2016-04-14 NOTE — Telephone Encounter (Signed)
Amoxicillin 500 mg, 4 by mouth take one hour before procedure

## 2016-04-15 ENCOUNTER — Other Ambulatory Visit: Payer: Self-pay | Admitting: Internal Medicine

## 2016-04-15 MED ORDER — AMOXICILLIN 500 MG PO CAPS: ORAL_CAPSULE | ORAL | 0 refills | Status: DC

## 2016-04-15 NOTE — Telephone Encounter (Signed)
Prescription sent electronically to pharmacy. Patient notified. 

## 2016-04-21 ENCOUNTER — Encounter: Payer: Self-pay | Admitting: Internal Medicine

## 2016-04-21 ENCOUNTER — Ambulatory Visit (INDEPENDENT_AMBULATORY_CARE_PROVIDER_SITE_OTHER): Payer: Medicare Other | Admitting: Internal Medicine

## 2016-04-21 VITALS — BP 144/84 | HR 84 | Ht 61.0 in | Wt 148.6 lb

## 2016-04-21 DIAGNOSIS — I495 Sick sinus syndrome: Secondary | ICD-10-CM

## 2016-04-21 DIAGNOSIS — Z950 Presence of cardiac pacemaker: Secondary | ICD-10-CM | POA: Diagnosis not present

## 2016-04-21 LAB — CUP PACEART INCLINIC DEVICE CHECK
Brady Statistic AP VS Percent: 95 %
Brady Statistic AS VS Percent: 0 %
Date Time Interrogation Session: 20171030165100
Implantable Lead Implant Date: 20050110
Implantable Lead Location: 753859
Implantable Lead Location: 753860
Lead Channel Impedance Value: 559 Ohm
Lead Channel Pacing Threshold Amplitude: 0.5 V
Lead Channel Pacing Threshold Pulse Width: 0.64 ms
Lead Channel Sensing Intrinsic Amplitude: 5.6 mV
Lead Channel Setting Sensing Sensitivity: 2 mV
MDC IDC LEAD IMPLANT DT: 20050110
MDC IDC MSMT BATTERY IMPEDANCE: 848 Ohm
MDC IDC MSMT BATTERY REMAINING LONGEVITY: 59 mo
MDC IDC MSMT BATTERY VOLTAGE: 2.77 V
MDC IDC MSMT LEADCHNL RA IMPEDANCE VALUE: 393 Ohm
MDC IDC MSMT LEADCHNL RA PACING THRESHOLD AMPLITUDE: 0.75 V
MDC IDC MSMT LEADCHNL RA PACING THRESHOLD PULSEWIDTH: 0.4 ms
MDC IDC PG IMPLANT DT: 20120711
MDC IDC SET LEADCHNL RA PACING AMPLITUDE: 2 V
MDC IDC SET LEADCHNL RV PACING AMPLITUDE: 2.5 V
MDC IDC SET LEADCHNL RV PACING PULSEWIDTH: 0.64 ms
MDC IDC STAT BRADY AP VP PERCENT: 4 %
MDC IDC STAT BRADY AS VP PERCENT: 0 %

## 2016-04-21 NOTE — Patient Instructions (Signed)
Medication Instructions:    Your physician recommends that you continue on your current medications as directed. Please refer to the Current Medication list given to you today.  --- If you need a refill on your cardiac medications before your next appointment, please call your pharmacy. ---  Labwork:  None ordered  Testing/Procedures:  None ordered  Follow-Up: Remote monitoring is used to monitor your Pacemaker of ICD from home. This monitoring reduces the number of office visits required to check your device to one time per year. It allows us to keep an eye on the functioning of your device to ensure it is working properly. You are scheduled for a device check from home on 07/21/2016. You may send your transmission at any time that day. If you have a wireless device, the transmission will be sent automatically. After your physician reviews your transmission, you will receive a postcard with your next transmission date.   Your physician wants you to follow-up in: 1 year with Dr. Taylor.  You will receive a reminder letter in the mail two months in advance. If you don't receive a letter, please call our office to schedule the follow-up appointment.   Thank you for choosing [facility]!!      
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
## 2016-04-23 ENCOUNTER — Other Ambulatory Visit: Payer: Self-pay | Admitting: Family Medicine

## 2016-04-29 DIAGNOSIS — Z23 Encounter for immunization: Secondary | ICD-10-CM | POA: Diagnosis not present

## 2016-05-19 ENCOUNTER — Other Ambulatory Visit: Payer: Self-pay | Admitting: Internal Medicine

## 2016-05-24 ENCOUNTER — Other Ambulatory Visit: Payer: Self-pay | Admitting: Family Medicine

## 2016-06-24 ENCOUNTER — Other Ambulatory Visit: Payer: Self-pay | Admitting: Family Medicine

## 2016-07-21 ENCOUNTER — Ambulatory Visit (INDEPENDENT_AMBULATORY_CARE_PROVIDER_SITE_OTHER): Payer: Medicare Other | Admitting: *Deleted

## 2016-07-21 ENCOUNTER — Telehealth: Payer: Self-pay | Admitting: Cardiology

## 2016-07-21 DIAGNOSIS — I495 Sick sinus syndrome: Secondary | ICD-10-CM | POA: Diagnosis not present

## 2016-07-21 NOTE — Telephone Encounter (Signed)
Confirmed remote transmission w/ pt daughter.   

## 2016-07-22 ENCOUNTER — Encounter: Payer: Self-pay | Admitting: Cardiology

## 2016-07-22 LAB — CUP PACEART REMOTE DEVICE CHECK
Battery Remaining Longevity: 53 mo
Brady Statistic AP VS Percent: 99 %
Brady Statistic AS VP Percent: 0 %
Brady Statistic AS VS Percent: 0 %
Implantable Lead Implant Date: 20050110
Implantable Lead Implant Date: 20050110
Implantable Lead Location: 753859
Implantable Lead Model: 5076
Lead Channel Impedance Value: 602 Ohm
Lead Channel Pacing Threshold Amplitude: 0.625 V
Lead Channel Pacing Threshold Amplitude: 0.625 V
Lead Channel Pacing Threshold Pulse Width: 0.4 ms
Lead Channel Pacing Threshold Pulse Width: 0.4 ms
Lead Channel Setting Pacing Amplitude: 2 V
MDC IDC LEAD LOCATION: 753860
MDC IDC MSMT BATTERY IMPEDANCE: 978 Ohm
MDC IDC MSMT BATTERY VOLTAGE: 2.77 V
MDC IDC MSMT LEADCHNL RA IMPEDANCE VALUE: 388 Ohm
MDC IDC PG IMPLANT DT: 20120711
MDC IDC SESS DTM: 20180129191129
MDC IDC SET LEADCHNL RV PACING AMPLITUDE: 2.5 V
MDC IDC SET LEADCHNL RV PACING PULSEWIDTH: 0.4 ms
MDC IDC SET LEADCHNL RV SENSING SENSITIVITY: 4 mV
MDC IDC STAT BRADY AP VP PERCENT: 1 %

## 2016-07-22 NOTE — Progress Notes (Signed)
Remote pacemaker transmission.   

## 2016-07-23 ENCOUNTER — Other Ambulatory Visit: Payer: Self-pay | Admitting: Family Medicine

## 2016-07-23 NOTE — Telephone Encounter (Signed)
The patient may have refill with one additional refill-please mail card that it is necessary for them to come for an office visit to get further prescriptions

## 2016-07-23 NOTE — Telephone Encounter (Signed)
Labs 06/2015, last refill sent w/ need appt note, appt not sch.

## 2016-08-15 ENCOUNTER — Encounter: Payer: Self-pay | Admitting: Family Medicine

## 2016-08-15 ENCOUNTER — Ambulatory Visit (INDEPENDENT_AMBULATORY_CARE_PROVIDER_SITE_OTHER): Payer: Medicare Other | Admitting: Family Medicine

## 2016-08-15 VITALS — BP 118/76 | Ht 61.0 in | Wt 146.0 lb

## 2016-08-15 DIAGNOSIS — E038 Other specified hypothyroidism: Secondary | ICD-10-CM | POA: Diagnosis not present

## 2016-08-15 DIAGNOSIS — Z23 Encounter for immunization: Secondary | ICD-10-CM | POA: Diagnosis not present

## 2016-08-15 DIAGNOSIS — E784 Other hyperlipidemia: Secondary | ICD-10-CM | POA: Diagnosis not present

## 2016-08-15 DIAGNOSIS — M109 Gout, unspecified: Secondary | ICD-10-CM | POA: Diagnosis not present

## 2016-08-15 DIAGNOSIS — I10 Essential (primary) hypertension: Secondary | ICD-10-CM

## 2016-08-15 DIAGNOSIS — E7849 Other hyperlipidemia: Secondary | ICD-10-CM

## 2016-08-15 NOTE — Progress Notes (Signed)
   Subjective:    Patient ID: Alexandra Hall, female    DOB: 12/26/1921, 83 y.o.   MRN: [id number]  Hypertension  This is a chronic problem. The current episode started more than 1 year ago. Risk factors for coronary artery disease include dyslipidemia, post-menopausal state and sedentary lifestyle. Treatments tried: metoprolol, lasix. There are no compliance problems.    Staggers at times and has had a few falls but doing overall good Ms. best she knows her blood pressures been doing good She does take her diuretic and her thyroid medicine on a regular basis She is not had any flareups of gout tries the healthy She is due for pneumonia booster She is followed by specialist for chronic kidney disease they had worries about possible dialysis previous lab work shows creatinine under 2 unlikely for dialysis currently Her specialist stopped her medicine for her osteoporosis  Review of Systems Patient denies chest tightness pressure pain shortness breath denies vomiting diarrhea fever chills    Objective:   Physical Exam She has significant kyphosis lungs are clear no crackles heart is regular pulse normal extremities trace edema she can walk fairly well holding on to her daughter but she does have a tilt forward which increases her risk for falling demonstration was shown to the patient for better walking and better balance       Assessment & Plan:  Severe kidney disease followed by kidney specialist recent creatinine wasn't as bad will be seen specialists in the coming weeks they will be doing lab work  Significant kyphosis stable  Mild ataxia issues we talked about strategies to prevent falls using a walker with walking  No flareup of gout recently check uric acid  Hypothyroidism continue medication check TSH  Osteoporosis kidney specialist stopped Fosamax  Pneumonia vaccine given today  Hyperlipidemia previous lab work reviewed further labs ordered continue current  medication

## 2016-08-16 LAB — HEPATIC FUNCTION PANEL
ALT: 8 IU/L (ref 0–32)
AST: 18 IU/L (ref 0–40)
Albumin: 3.8 g/dL (ref 3.2–4.6)
Alkaline Phosphatase: 123 IU/L — ABNORMAL HIGH (ref 39–117)
BILIRUBIN TOTAL: 0.6 mg/dL (ref 0.0–1.2)
Bilirubin, Direct: 0.17 mg/dL (ref 0.00–0.40)
Total Protein: 6.7 g/dL (ref 6.0–8.5)

## 2016-08-16 LAB — LIPID PANEL
CHOL/HDL RATIO: 4.2 ratio (ref 0.0–4.4)
CHOLESTEROL TOTAL: 180 mg/dL (ref 100–199)
HDL: 43 mg/dL (ref >39)
LDL CALC: 112 mg/dL — AB (ref 0–99)
TRIGLYCERIDES: 123 mg/dL (ref 0–149)
VLDL CHOLESTEROL CAL: 25 mg/dL (ref 5–40)

## 2016-08-16 LAB — TSH: TSH: 0.488 u[IU]/mL (ref 0.450–4.500)

## 2016-08-16 LAB — URIC ACID: URIC ACID: 4.6 mg/dL (ref 2.5–7.1)

## 2016-08-17 ENCOUNTER — Encounter: Payer: Self-pay | Admitting: Family Medicine

## 2016-08-21 DIAGNOSIS — I38 Endocarditis, valve unspecified: Secondary | ICD-10-CM | POA: Diagnosis not present

## 2016-08-21 DIAGNOSIS — M109 Gout, unspecified: Secondary | ICD-10-CM | POA: Diagnosis not present

## 2016-08-21 DIAGNOSIS — I129 Hypertensive chronic kidney disease with stage 1 through stage 4 chronic kidney disease, or unspecified chronic kidney disease: Secondary | ICD-10-CM | POA: Diagnosis not present

## 2016-08-21 DIAGNOSIS — N2581 Secondary hyperparathyroidism of renal origin: Secondary | ICD-10-CM | POA: Diagnosis not present

## 2016-08-21 DIAGNOSIS — E039 Hypothyroidism, unspecified: Secondary | ICD-10-CM | POA: Diagnosis not present

## 2016-08-21 DIAGNOSIS — E782 Mixed hyperlipidemia: Secondary | ICD-10-CM | POA: Diagnosis not present

## 2016-08-21 DIAGNOSIS — N184 Chronic kidney disease, stage 4 (severe): Secondary | ICD-10-CM | POA: Diagnosis not present

## 2016-08-21 DIAGNOSIS — N289 Disorder of kidney and ureter, unspecified: Secondary | ICD-10-CM | POA: Diagnosis not present

## 2016-08-21 DIAGNOSIS — I25810 Atherosclerosis of coronary artery bypass graft(s) without angina pectoris: Secondary | ICD-10-CM | POA: Diagnosis not present

## 2016-08-21 DIAGNOSIS — D631 Anemia in chronic kidney disease: Secondary | ICD-10-CM | POA: Diagnosis not present

## 2016-08-22 ENCOUNTER — Other Ambulatory Visit: Payer: Self-pay | Admitting: Family Medicine

## 2016-09-09 ENCOUNTER — Other Ambulatory Visit: Payer: Self-pay | Admitting: Family Medicine

## 2016-10-20 ENCOUNTER — Ambulatory Visit (INDEPENDENT_AMBULATORY_CARE_PROVIDER_SITE_OTHER): Payer: Medicare Other | Admitting: *Deleted

## 2016-10-20 ENCOUNTER — Telehealth: Payer: Self-pay | Admitting: Cardiology

## 2016-10-20 DIAGNOSIS — I495 Sick sinus syndrome: Secondary | ICD-10-CM

## 2016-10-20 NOTE — Telephone Encounter (Signed)
Spoke with pt and reminded pt of remote transmission that is due today. Pt verbalized understanding.   

## 2016-10-21 ENCOUNTER — Encounter: Payer: Self-pay | Admitting: Cardiology

## 2016-10-21 LAB — CUP PACEART REMOTE DEVICE CHECK
Battery Impedance: 1085 Ohm
Brady Statistic AP VP Percent: 3 %
Brady Statistic AP VS Percent: 96 %
Brady Statistic AS VP Percent: 0 %
Brady Statistic AS VS Percent: 0 %
Date Time Interrogation Session: 20180430222050
Implantable Lead Implant Date: 20050110
Implantable Lead Location: 753859
Implantable Lead Model: 5076
Lead Channel Impedance Value: 388 Ohm
Lead Channel Impedance Value: 596 Ohm
Lead Channel Pacing Threshold Amplitude: 0.625 V
Lead Channel Pacing Threshold Pulse Width: 0.4 ms
Lead Channel Setting Pacing Amplitude: 2.5 V
MDC IDC LEAD IMPLANT DT: 20050110
MDC IDC LEAD LOCATION: 753860
MDC IDC MSMT BATTERY REMAINING LONGEVITY: 50 mo
MDC IDC MSMT BATTERY VOLTAGE: 2.77 V
MDC IDC MSMT LEADCHNL RV PACING THRESHOLD AMPLITUDE: 0.5 V
MDC IDC MSMT LEADCHNL RV PACING THRESHOLD PULSEWIDTH: 0.4 ms
MDC IDC PG IMPLANT DT: 20120711
MDC IDC SET LEADCHNL RA PACING AMPLITUDE: 2 V
MDC IDC SET LEADCHNL RV PACING PULSEWIDTH: 0.4 ms
MDC IDC SET LEADCHNL RV SENSING SENSITIVITY: 2.8 mV

## 2016-10-21 NOTE — Progress Notes (Signed)
Remote pacemaker transmission.   

## 2017-01-19 ENCOUNTER — Telehealth: Payer: Self-pay | Admitting: Cardiology

## 2017-01-19 ENCOUNTER — Ambulatory Visit (INDEPENDENT_AMBULATORY_CARE_PROVIDER_SITE_OTHER): Payer: Medicare Other | Admitting: *Deleted

## 2017-01-19 DIAGNOSIS — I495 Sick sinus syndrome: Secondary | ICD-10-CM

## 2017-01-19 NOTE — Telephone Encounter (Signed)
Spoke with pt and reminded pt of remote transmission that is due today. Pt verbalized understanding.   

## 2017-01-27 ENCOUNTER — Encounter: Payer: Self-pay | Admitting: Cardiology

## 2017-01-27 NOTE — Progress Notes (Signed)
Remote pacemaker transmission.   

## 2017-02-16 LAB — CUP PACEART REMOTE DEVICE CHECK
Battery Impedance: 1220 Ohm
Brady Statistic AP VP Percent: 4 %
Brady Statistic AP VS Percent: 96 %
Brady Statistic AS VP Percent: 0 %
Brady Statistic AS VS Percent: 0 %
Implantable Lead Implant Date: 20050110
Implantable Lead Implant Date: 20050110
Implantable Lead Location: 753860
Implantable Lead Model: 5076
Implantable Pulse Generator Implant Date: 20120711
Lead Channel Impedance Value: 403 Ohm
Lead Channel Impedance Value: 565 Ohm
Lead Channel Pacing Threshold Amplitude: 0.625 V
Lead Channel Pacing Threshold Amplitude: 0.75 V
Lead Channel Pacing Threshold Pulse Width: 0.4 ms
Lead Channel Setting Pacing Amplitude: 2.5 V
Lead Channel Setting Sensing Sensitivity: 2 mV
MDC IDC LEAD LOCATION: 753859
MDC IDC MSMT BATTERY REMAINING LONGEVITY: 46 mo
MDC IDC MSMT BATTERY VOLTAGE: 2.77 V
MDC IDC MSMT LEADCHNL RV PACING THRESHOLD PULSEWIDTH: 0.4 ms
MDC IDC SESS DTM: 20180802152654
MDC IDC SET LEADCHNL RA PACING AMPLITUDE: 2 V
MDC IDC SET LEADCHNL RV PACING PULSEWIDTH: 0.4 ms

## 2017-04-10 ENCOUNTER — Other Ambulatory Visit: Payer: Self-pay | Admitting: Family Medicine

## 2017-04-27 ENCOUNTER — Ambulatory Visit (INDEPENDENT_AMBULATORY_CARE_PROVIDER_SITE_OTHER): Payer: Medicare Other | Admitting: *Deleted

## 2017-04-27 DIAGNOSIS — Z23 Encounter for immunization: Secondary | ICD-10-CM | POA: Diagnosis not present

## 2017-04-27 DIAGNOSIS — I495 Sick sinus syndrome: Secondary | ICD-10-CM

## 2017-04-28 NOTE — Progress Notes (Signed)
Remote pacemaker transmission.   

## 2017-04-29 ENCOUNTER — Encounter: Payer: Self-pay | Admitting: Cardiology

## 2017-05-02 LAB — CUP PACEART REMOTE DEVICE CHECK
Brady Statistic AP VS Percent: 97 %
Brady Statistic AS VS Percent: 0 %
Date Time Interrogation Session: 20181105183234
Implantable Lead Implant Date: 20050110
Implantable Lead Location: 753859
Implantable Lead Model: 5076
Lead Channel Impedance Value: 537 Ohm
Lead Channel Pacing Threshold Amplitude: 0.625 V
Lead Channel Pacing Threshold Pulse Width: 0.4 ms
Lead Channel Pacing Threshold Pulse Width: 0.4 ms
MDC IDC LEAD IMPLANT DT: 20050110
MDC IDC LEAD LOCATION: 753860
MDC IDC MSMT BATTERY IMPEDANCE: 1329 Ohm
MDC IDC MSMT BATTERY REMAINING LONGEVITY: 43 mo
MDC IDC MSMT BATTERY VOLTAGE: 2.76 V
MDC IDC MSMT LEADCHNL RA IMPEDANCE VALUE: 394 Ohm
MDC IDC MSMT LEADCHNL RA PACING THRESHOLD AMPLITUDE: 0.625 V
MDC IDC PG IMPLANT DT: 20120711
MDC IDC SET LEADCHNL RA PACING AMPLITUDE: 2 V
MDC IDC SET LEADCHNL RV PACING AMPLITUDE: 2.5 V
MDC IDC SET LEADCHNL RV PACING PULSEWIDTH: 0.4 ms
MDC IDC SET LEADCHNL RV SENSING SENSITIVITY: 2 mV
MDC IDC STAT BRADY AP VP PERCENT: 3 %
MDC IDC STAT BRADY AS VP PERCENT: 0 %

## 2017-05-04 DIAGNOSIS — I38 Endocarditis, valve unspecified: Secondary | ICD-10-CM | POA: Diagnosis not present

## 2017-05-04 DIAGNOSIS — N184 Chronic kidney disease, stage 4 (severe): Secondary | ICD-10-CM | POA: Diagnosis not present

## 2017-05-04 DIAGNOSIS — I495 Sick sinus syndrome: Secondary | ICD-10-CM | POA: Diagnosis not present

## 2017-05-04 DIAGNOSIS — D631 Anemia in chronic kidney disease: Secondary | ICD-10-CM | POA: Diagnosis not present

## 2017-05-04 DIAGNOSIS — I25810 Atherosclerosis of coronary artery bypass graft(s) without angina pectoris: Secondary | ICD-10-CM | POA: Diagnosis not present

## 2017-05-04 DIAGNOSIS — E039 Hypothyroidism, unspecified: Secondary | ICD-10-CM | POA: Diagnosis not present

## 2017-05-04 DIAGNOSIS — N289 Disorder of kidney and ureter, unspecified: Secondary | ICD-10-CM | POA: Diagnosis not present

## 2017-05-04 DIAGNOSIS — Z950 Presence of cardiac pacemaker: Secondary | ICD-10-CM | POA: Diagnosis not present

## 2017-05-04 DIAGNOSIS — E782 Mixed hyperlipidemia: Secondary | ICD-10-CM | POA: Diagnosis not present

## 2017-05-04 DIAGNOSIS — I129 Hypertensive chronic kidney disease with stage 1 through stage 4 chronic kidney disease, or unspecified chronic kidney disease: Secondary | ICD-10-CM | POA: Diagnosis not present

## 2017-05-04 DIAGNOSIS — M109 Gout, unspecified: Secondary | ICD-10-CM | POA: Diagnosis not present

## 2017-05-04 DIAGNOSIS — N2581 Secondary hyperparathyroidism of renal origin: Secondary | ICD-10-CM | POA: Diagnosis not present

## 2017-05-16 ENCOUNTER — Other Ambulatory Visit: Payer: Self-pay | Admitting: Family Medicine

## 2017-05-19 ENCOUNTER — Other Ambulatory Visit: Payer: Self-pay | Admitting: Internal Medicine

## 2017-06-05 ENCOUNTER — Encounter: Payer: Self-pay | Admitting: Internal Medicine

## 2017-06-09 ENCOUNTER — Ambulatory Visit (INDEPENDENT_AMBULATORY_CARE_PROVIDER_SITE_OTHER): Payer: Medicare Other | Admitting: Internal Medicine

## 2017-06-09 ENCOUNTER — Encounter: Payer: Self-pay | Admitting: Internal Medicine

## 2017-06-09 VITALS — BP 120/66 | HR 81 | Ht 61.0 in | Wt 142.2 lb

## 2017-06-09 DIAGNOSIS — Z950 Presence of cardiac pacemaker: Secondary | ICD-10-CM | POA: Diagnosis not present

## 2017-06-09 DIAGNOSIS — I495 Sick sinus syndrome: Secondary | ICD-10-CM
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
## 2017-06-09 NOTE — Patient Instructions (Addendum)
Medication Instructions:  Your physician recommends that you continue on your current medications as directed. Please refer to the Current Medication list given to you today.  Labwork: None ordered.  Testing/Procedures: None ordered.  Follow-Up: Your physician wants you to follow-up in: one year with Dr. Lovena Le.   You will receive a reminder letter in the mail two months in advance. If you don't receive a letter, please call our office to schedule the follow-up appointment.  Remote monitoring is used to monitor your Pacemaker from home. This monitoring reduces the number of office visits required to check your device to one time per year. It allows [place] to keep an eye on the functioning of your device to ensure it is working properly. You are scheduled for a device check from home on 07/27/2017. You may send your transmission at any time that day. If you have a wireless device, the transmission will be sent automatically. After your physician reviews your transmission, you will receive a postcard with your next transmission date.  Any Other Special Instructions Will Be Listed Below (If Applicable).  If you need a refill on your cardiac medications before your next appointment, please call your pharmacy.

## 2017-06-10 LAB — CUP PACEART INCLINIC DEVICE CHECK
Battery Impedance: 1384 Ohm
Battery Voltage: 2.76 V
Brady Statistic AP VP Percent: 2 %
Brady Statistic AS VP Percent: 0 %
Date Time Interrogation Session: 20181218180426
Implantable Lead Implant Date: 20050110
Implantable Lead Location: 753860
Implantable Lead Model: 5076
Lead Channel Impedance Value: 555 Ohm
Lead Channel Pacing Threshold Amplitude: 0.625 V
Lead Channel Pacing Threshold Amplitude: 0.625 V
Lead Channel Pacing Threshold Pulse Width: 0.4 ms
Lead Channel Sensing Intrinsic Amplitude: 5.6 mV
Lead Channel Setting Pacing Amplitude: 2.5 V
Lead Channel Setting Sensing Sensitivity: 2 mV
MDC IDC LEAD IMPLANT DT: 20050110
MDC IDC LEAD LOCATION: 753859
MDC IDC MSMT BATTERY REMAINING LONGEVITY: 42 mo
MDC IDC MSMT LEADCHNL RA IMPEDANCE VALUE: 388 Ohm
MDC IDC MSMT LEADCHNL RA PACING THRESHOLD AMPLITUDE: 0.75 V
MDC IDC MSMT LEADCHNL RA PACING THRESHOLD PULSEWIDTH: 0.4 ms
MDC IDC MSMT LEADCHNL RV PACING THRESHOLD AMPLITUDE: 0.5 V
MDC IDC MSMT LEADCHNL RV PACING THRESHOLD PULSEWIDTH: 0.4 ms
MDC IDC MSMT LEADCHNL RV PACING THRESHOLD PULSEWIDTH: 0.4 ms
MDC IDC PG IMPLANT DT: 20120711
MDC IDC SET LEADCHNL RA PACING AMPLITUDE: 2 V
MDC IDC SET LEADCHNL RV PACING PULSEWIDTH: 0.4 ms
MDC IDC STAT BRADY AP VS PERCENT: 97 %
MDC IDC STAT BRADY AS VS PERCENT: 0 %

## 2017-06-23 ENCOUNTER — Other Ambulatory Visit: Payer: Self-pay | Admitting: Internal Medicine

## 2017-06-23 ENCOUNTER — Other Ambulatory Visit: Payer: Self-pay | Admitting: Family Medicine

## 2017-06-24 ENCOUNTER — Other Ambulatory Visit: Payer: Self-pay

## 2017-06-24 MED ORDER — FUROSEMIDE 20 MG PO TABS: 20.0000 mg | ORAL_TABLET | Freq: Every day | ORAL | 2 refills | Status: DC

## 2017-07-03 ENCOUNTER — Ambulatory Visit (INDEPENDENT_AMBULATORY_CARE_PROVIDER_SITE_OTHER): Payer: Medicare Other | Admitting: Family Medicine

## 2017-07-03 ENCOUNTER — Encounter: Payer: Self-pay | Admitting: Family Medicine

## 2017-07-03 VITALS — BP 120/68 | Ht 61.0 in | Wt 141.0 lb

## 2017-07-03 DIAGNOSIS — E038 Other specified hypothyroidism: Secondary | ICD-10-CM

## 2017-07-03 DIAGNOSIS — I10 Essential (primary) hypertension: Secondary | ICD-10-CM

## 2017-07-03 DIAGNOSIS — E7849 Other hyperlipidemia: Secondary | ICD-10-CM | POA: Diagnosis not present

## 2017-07-03 DIAGNOSIS — Z79899 Other long term (current) drug therapy: Secondary | ICD-10-CM | POA: Diagnosis not present

## 2017-07-03 DIAGNOSIS — Z8739 Personal history of other diseases of the musculoskeletal system and connective tissue: Secondary | ICD-10-CM

## 2017-07-03 DIAGNOSIS — N182 Chronic kidney disease, stage 2 (mild): Secondary | ICD-10-CM | POA: Diagnosis not present

## 2017-07-03 DIAGNOSIS — R5383 Other fatigue: Secondary | ICD-10-CM

## 2017-07-03 MED ORDER — LEVOTHYROXINE SODIUM 88 MCG PO TABS: ORAL_TABLET | ORAL | 3 refills | Status: DC

## 2017-07-03 NOTE — Progress Notes (Signed)
   Subjective:    Patient ID: Alexandra Hall, female    DOB: [birth year], 84 y.o.   MRN: [id number]  HPI Patient is here today with her Dtr [facility].Pt is here today for follow up on Htn. She is currently taking Metoprolol 25 mg one daily,and Furosemide 20mg  once daily.She eats pretty good, and does not get much exercise. This patient does have blood pressure issues she eats relatively healthy but she typically eats whatever is served to her She is functional she is able to get dressed able to help her self get bathed and eat L she is able to walk without falling She does have history of gout but has not had any flareups lately She has history of chronic kidney disease she takes her medicine she does not like to come to the doctor she comes here typically once a year She does have some mild fatigue and tiredness but she believes this is coming with her age She denies being depressed Denies any rectal bleeding chest pressure tightness pain or discomfort  Review of Systems  Constitutional: Negative for activity change, fatigue and fever.  HENT: Negative for congestion.   Respiratory: Negative for cough, chest tightness and shortness of breath.   Cardiovascular: Negative for chest pain and leg swelling.  Gastrointestinal: Negative for abdominal pain.  Skin: Negative for color change.  Neurological: Negative for headaches.  Psychiatric/Behavioral: Negative for behavioral problems.       Objective:   Physical Exam  Constitutional: She appears well-developed and well-nourished. No distress.  HENT:  Head: Normocephalic and atraumatic.  Eyes: Right eye exhibits no discharge. Left eye exhibits no discharge.  Neck: No tracheal deviation present.  Cardiovascular: Normal rate, regular rhythm and normal heart sounds.  No murmur heard. Pulmonary/Chest: Effort normal and breath sounds normal. No respiratory distress. She has no wheezes. She has no rales.  Musculoskeletal: She exhibits no edema.   Lymphadenopathy:    She has no cervical adenopathy.  Neurological: She is alert. She exhibits normal muscle tone.  Skin: Skin is warm and dry. No erythema.  Psychiatric: Her behavior is normal.  Vitals reviewed.         Assessment & Plan:  1. Essential hypertension Blood pressure good control continue current measures - Basic metabolic panel  2. Other specified hypothyroidism Hypothyroidism check lab work continue current measures  3. Stage 2 chronic kidney disease Check renal function avoid excessive salt excessive protein watch diet  4. Other hyperlipidemia Hyperlipidemia watch diet as best as possible - Lipid panel  5. History of gout History of gout check uric acid patient was on allopurinol but no longer taking this - Uric acid  6. Fatigue, unspecified type Significant fatigue tiredness at times other times doing very well check CBC - CBC with Differential/Platelet  7. High risk medication use Check liver function - Hepatic function panel  25 minutes was spent with the patient. Greater than half the time was spent in discussion and answering questions and counseling regarding the issues that the patient came in for today.  Patient rarely comes to the office does not want to come on a regular basis we will see her back in 1 year or sooner if any problems is her daughter is doing a good job of helping her

## 2017-07-20 DIAGNOSIS — E7849 Other hyperlipidemia: Secondary | ICD-10-CM | POA: Diagnosis not present

## 2017-07-20 DIAGNOSIS — Z8739 Personal history of other diseases of the musculoskeletal system and connective tissue: Secondary | ICD-10-CM | POA: Diagnosis not present

## 2017-07-20 DIAGNOSIS — Z79899 Other long term (current) drug therapy: Secondary | ICD-10-CM | POA: Diagnosis not present

## 2017-07-20 DIAGNOSIS — R5383 Other fatigue: Secondary | ICD-10-CM | POA: Diagnosis not present

## 2017-07-20 DIAGNOSIS — I10 Essential (primary) hypertension: Secondary | ICD-10-CM | POA: Diagnosis not present

## 2017-07-21 LAB — LIPID PANEL
CHOL/HDL RATIO: 4.9 ratio — AB (ref 0.0–4.4)
Cholesterol, Total: 229 mg/dL — ABNORMAL HIGH (ref 100–199)
HDL: 47 mg/dL (ref >39)
LDL Calculated: 160 mg/dL — ABNORMAL HIGH (ref 0–99)
Triglycerides: 111 mg/dL (ref 0–149)
VLDL Cholesterol Cal: 22 mg/dL (ref 5–40)

## 2017-07-21 LAB — CBC WITH DIFFERENTIAL/PLATELET
BASOS ABS: 0.1 10*3/uL (ref 0.0–0.2)
Basos: 1 %
EOS (ABSOLUTE): 0.3 10*3/uL (ref 0.0–0.4)
Eos: 4 %
Hematocrit: 38 % (ref 34.0–46.6)
Hemoglobin: 12.7 g/dL (ref 11.1–15.9)
IMMATURE GRANS (ABS): 0 10*3/uL (ref 0.0–0.1)
IMMATURE GRANULOCYTES: 0 %
LYMPHS: 34 %
Lymphocytes Absolute: 2.4 10*3/uL (ref 0.7–3.1)
MCH: 32.9 pg (ref 26.6–33.0)
MCHC: 33.4 g/dL (ref 31.5–35.7)
MCV: 98 fL — ABNORMAL HIGH (ref 79–97)
Monocytes Absolute: 0.4 10*3/uL (ref 0.1–0.9)
Monocytes: 5 %
NEUTROS PCT: 56 %
Neutrophils Absolute: 4.1 10*3/uL (ref 1.4–7.0)
PLATELETS: 157 10*3/uL (ref 150–379)
RBC: 3.86 x10E6/uL (ref 3.77–5.28)
RDW: 15.3 % (ref 12.3–15.4)
WBC: 7.3 10*3/uL (ref 3.4–10.8)

## 2017-07-21 LAB — URIC ACID: URIC ACID: 5.6 mg/dL (ref 2.5–7.1)

## 2017-07-21 LAB — HEPATIC FUNCTION PANEL
ALBUMIN: 3.6 g/dL (ref 3.2–4.6)
ALK PHOS: 107 IU/L (ref 39–117)
ALT: 7 IU/L (ref 0–32)
AST: 18 IU/L (ref 0–40)
BILIRUBIN TOTAL: 0.4 mg/dL (ref 0.0–1.2)
BILIRUBIN, DIRECT: 0.12 mg/dL (ref 0.00–0.40)
Total Protein: 6.6 g/dL (ref 6.0–8.5)

## 2017-07-21 LAB — BASIC METABOLIC PANEL
BUN / CREAT RATIO: 13 (ref 12–28)
BUN: 27 mg/dL (ref 10–36)
CALCIUM: 8.9 mg/dL (ref 8.7–10.3)
CHLORIDE: 103 mmol/L (ref 96–106)
CO2: 23 mmol/L (ref 20–29)
CREATININE: 2.11 mg/dL — AB (ref 0.57–1.00)
GFR calc Af Amer: 23 mL/min/{1.73_m2} — ABNORMAL LOW (ref >59)
GFR calc non Af Amer: 20 mL/min/{1.73_m2} — ABNORMAL LOW (ref >59)
GLUCOSE: 94 mg/dL (ref 65–99)
Potassium: 4.7 mmol/L (ref 3.5–5.2)
Sodium: 142 mmol/L (ref 134–144)

## 2017-07-23 ENCOUNTER — Other Ambulatory Visit: Payer: Self-pay | Admitting: Family Medicine

## 2017-07-27 ENCOUNTER — Ambulatory Visit (INDEPENDENT_AMBULATORY_CARE_PROVIDER_SITE_OTHER): Payer: Medicare Other | Admitting: *Deleted

## 2017-07-27 DIAGNOSIS — I495 Sick sinus syndrome: Secondary | ICD-10-CM

## 2017-07-28 ENCOUNTER — Encounter: Payer: Self-pay | Admitting: Cardiology

## 2017-07-28 NOTE — Progress Notes (Signed)
Remote pacemaker transmission.   

## 2017-08-18 LAB — CUP PACEART REMOTE DEVICE CHECK
Battery Impedance: 1582 Ohm
Battery Remaining Longevity: 37 mo
Battery Voltage: 2.75 V
Brady Statistic AP VS Percent: 100 %
Brady Statistic AS VP Percent: 0 %
Brady Statistic AS VS Percent: 0 %
Date Time Interrogation Session: 20190204173448
Implantable Lead Implant Date: 20050110
Implantable Lead Location: 753859
Implantable Lead Location: 753860
Implantable Lead Model: 5076
Implantable Lead Model: 5076
Implantable Pulse Generator Implant Date: 20120711
Lead Channel Impedance Value: 554 Ohm
Lead Channel Pacing Threshold Amplitude: 0.625 V
Lead Channel Pacing Threshold Pulse Width: 0.4 ms
Lead Channel Pacing Threshold Pulse Width: 0.4 ms
Lead Channel Setting Pacing Amplitude: 2.5 V
MDC IDC LEAD IMPLANT DT: 20050110
MDC IDC MSMT LEADCHNL RA IMPEDANCE VALUE: 394 Ohm
MDC IDC MSMT LEADCHNL RV PACING THRESHOLD AMPLITUDE: 0.625 V
MDC IDC SET LEADCHNL RA PACING AMPLITUDE: 2 V
MDC IDC SET LEADCHNL RV PACING PULSEWIDTH: 0.4 ms
MDC IDC SET LEADCHNL RV SENSING SENSITIVITY: 2 mV
MDC IDC STAT BRADY AP VP PERCENT: 0 %

## 2017-09-09 DIAGNOSIS — I129 Hypertensive chronic kidney disease with stage 1 through stage 4 chronic kidney disease, or unspecified chronic kidney disease: Secondary | ICD-10-CM | POA: Diagnosis not present

## 2017-09-09 DIAGNOSIS — D631 Anemia in chronic kidney disease: Secondary | ICD-10-CM | POA: Diagnosis not present

## 2017-09-09 DIAGNOSIS — N184 Chronic kidney disease, stage 4 (severe): Secondary | ICD-10-CM | POA: Diagnosis not present

## 2017-09-09 DIAGNOSIS — N2581 Secondary hyperparathyroidism of renal origin: Secondary | ICD-10-CM | POA: Diagnosis not present

## 2017-09-09 DIAGNOSIS — I25810 Atherosclerosis of coronary artery bypass graft(s) without angina pectoris: Secondary | ICD-10-CM | POA: Diagnosis not present

## 2017-10-06 ENCOUNTER — Other Ambulatory Visit: Payer: Self-pay | Admitting: Internal Medicine

## 2017-10-26 ENCOUNTER — Ambulatory Visit (INDEPENDENT_AMBULATORY_CARE_PROVIDER_SITE_OTHER): Payer: Medicare Other | Admitting: *Deleted

## 2017-10-26 DIAGNOSIS — I495 Sick sinus syndrome: Secondary | ICD-10-CM

## 2017-10-27 ENCOUNTER — Telehealth: Payer: Self-pay | Admitting: Cardiology

## 2017-10-27 NOTE — Telephone Encounter (Signed)
Spoke with pt and reminded pt of remote transmission that is due today. Pt verbalized understanding.   

## 2017-10-28 ENCOUNTER — Encounter: Payer: Self-pay | Admitting: Cardiology

## 2017-10-28 NOTE — Progress Notes (Signed)
Remote pacemaker transmission.   

## 2017-11-13 LAB — CUP PACEART REMOTE DEVICE CHECK
Brady Statistic AP VP Percent: 2 %
Brady Statistic AS VP Percent: 0 %
Brady Statistic AS VS Percent: 0 %
Date Time Interrogation Session: 20190507181711
Implantable Lead Implant Date: 20050110
Implantable Lead Location: 753859
Implantable Lead Model: 5076
Lead Channel Impedance Value: 389 Ohm
Lead Channel Impedance Value: 576 Ohm
Lead Channel Pacing Threshold Amplitude: 0.5 V
Lead Channel Pacing Threshold Amplitude: 0.625 V
Lead Channel Setting Sensing Sensitivity: 4 mV
MDC IDC LEAD IMPLANT DT: 20050110
MDC IDC LEAD LOCATION: 753860
MDC IDC MSMT BATTERY IMPEDANCE: 1724 Ohm
MDC IDC MSMT BATTERY REMAINING LONGEVITY: 34 mo
MDC IDC MSMT BATTERY VOLTAGE: 2.76 V
MDC IDC MSMT LEADCHNL RA PACING THRESHOLD PULSEWIDTH: 0.4 ms
MDC IDC MSMT LEADCHNL RV PACING THRESHOLD PULSEWIDTH: 0.4 ms
MDC IDC PG IMPLANT DT: 20120711
MDC IDC SET LEADCHNL RA PACING AMPLITUDE: 2 V
MDC IDC SET LEADCHNL RV PACING AMPLITUDE: 2.5 V
MDC IDC SET LEADCHNL RV PACING PULSEWIDTH: 0.4 ms
MDC IDC STAT BRADY AP VS PERCENT: 98 %

## 2018-01-25 ENCOUNTER — Ambulatory Visit (INDEPENDENT_AMBULATORY_CARE_PROVIDER_SITE_OTHER): Payer: Medicare Other | Admitting: *Deleted

## 2018-01-25 DIAGNOSIS — I495 Sick sinus syndrome: Secondary | ICD-10-CM

## 2018-01-27 NOTE — Progress Notes (Signed)
Remote pacemaker transmission.   

## 2018-01-29 DIAGNOSIS — N184 Chronic kidney disease, stage 4 (severe): Secondary | ICD-10-CM | POA: Diagnosis not present

## 2018-01-29 DIAGNOSIS — E039 Hypothyroidism, unspecified: Secondary | ICD-10-CM | POA: Diagnosis not present

## 2018-01-29 DIAGNOSIS — N2581 Secondary hyperparathyroidism of renal origin: Secondary | ICD-10-CM | POA: Diagnosis not present

## 2018-01-29 DIAGNOSIS — E782 Mixed hyperlipidemia: Secondary | ICD-10-CM | POA: Diagnosis not present

## 2018-01-29 DIAGNOSIS — Z0000 Encounter for general adult medical examination without abnormal findings: Secondary | ICD-10-CM | POA: Diagnosis not present

## 2018-01-29 DIAGNOSIS — I25810 Atherosclerosis of coronary artery bypass graft(s) without angina pectoris: Secondary | ICD-10-CM | POA: Diagnosis not present

## 2018-01-29 DIAGNOSIS — I495 Sick sinus syndrome: Secondary | ICD-10-CM | POA: Diagnosis not present

## 2018-01-29 DIAGNOSIS — I129 Hypertensive chronic kidney disease with stage 1 through stage 4 chronic kidney disease, or unspecified chronic kidney disease: Secondary | ICD-10-CM | POA: Diagnosis not present

## 2018-01-29 DIAGNOSIS — I38 Endocarditis, valve unspecified: Secondary | ICD-10-CM | POA: Diagnosis not present

## 2018-01-29 DIAGNOSIS — M109 Gout, unspecified: Secondary | ICD-10-CM | POA: Diagnosis not present

## 2018-01-29 DIAGNOSIS — N289 Disorder of kidney and ureter, unspecified: Secondary | ICD-10-CM | POA: Diagnosis not present

## 2018-01-29 DIAGNOSIS — D631 Anemia in chronic kidney disease: Secondary | ICD-10-CM | POA: Diagnosis not present

## 2018-02-09 LAB — CUP PACEART REMOTE DEVICE CHECK
Battery Impedance: 1872 Ohm
Battery Voltage: 2.75 V
Brady Statistic AP VP Percent: 1 %
Brady Statistic AP VS Percent: 99 %
Brady Statistic AS VP Percent: 0 %
Brady Statistic AS VS Percent: 0 %
Date Time Interrogation Session: 20190805151058
Implantable Lead Implant Date: 20050110
Implantable Lead Location: 753860
Implantable Lead Model: 5076
Implantable Lead Model: 5076
Lead Channel Impedance Value: 395 Ohm
Lead Channel Impedance Value: 559 Ohm
Lead Channel Pacing Threshold Amplitude: 0.625 V
Lead Channel Pacing Threshold Pulse Width: 0.4 ms
Lead Channel Pacing Threshold Pulse Width: 0.4 ms
Lead Channel Setting Pacing Amplitude: 2.5 V
Lead Channel Setting Pacing Pulse Width: 0.4 ms
MDC IDC LEAD IMPLANT DT: 20050110
MDC IDC LEAD LOCATION: 753859
MDC IDC MSMT BATTERY REMAINING LONGEVITY: 31 mo
MDC IDC MSMT LEADCHNL RV PACING THRESHOLD AMPLITUDE: 0.5 V
MDC IDC PG IMPLANT DT: 20120711
MDC IDC SET LEADCHNL RA PACING AMPLITUDE: 2 V
MDC IDC SET LEADCHNL RV SENSING SENSITIVITY: 4 mV

## 2018-04-26 ENCOUNTER — Ambulatory Visit (INDEPENDENT_AMBULATORY_CARE_PROVIDER_SITE_OTHER): Payer: Medicare Other | Admitting: *Deleted

## 2018-04-26 DIAGNOSIS — I495 Sick sinus syndrome: Secondary | ICD-10-CM | POA: Diagnosis not present

## 2018-04-28 NOTE — Progress Notes (Signed)
Remote pacemaker transmission.   

## 2018-04-29 ENCOUNTER — Encounter: Payer: Self-pay | Admitting: Cardiology

## 2018-05-05 ENCOUNTER — Encounter: Payer: Self-pay | Admitting: Internal Medicine

## 2018-05-11 DIAGNOSIS — N2581 Secondary hyperparathyroidism of renal origin: Secondary | ICD-10-CM | POA: Diagnosis not present

## 2018-05-11 DIAGNOSIS — E039 Hypothyroidism, unspecified: Secondary | ICD-10-CM | POA: Diagnosis not present

## 2018-05-11 DIAGNOSIS — Z950 Presence of cardiac pacemaker: Secondary | ICD-10-CM | POA: Diagnosis not present

## 2018-05-11 DIAGNOSIS — E782 Mixed hyperlipidemia: Secondary | ICD-10-CM | POA: Diagnosis not present

## 2018-05-11 DIAGNOSIS — D631 Anemia in chronic kidney disease: Secondary | ICD-10-CM | POA: Diagnosis not present

## 2018-05-11 DIAGNOSIS — N184 Chronic kidney disease, stage 4 (severe): Secondary | ICD-10-CM | POA: Diagnosis not present

## 2018-05-11 DIAGNOSIS — I38 Endocarditis, valve unspecified: Secondary | ICD-10-CM | POA: Diagnosis not present

## 2018-05-11 DIAGNOSIS — I25810 Atherosclerosis of coronary artery bypass graft(s) without angina pectoris: Secondary | ICD-10-CM | POA: Diagnosis not present

## 2018-05-11 DIAGNOSIS — I495 Sick sinus syndrome: Secondary | ICD-10-CM | POA: Diagnosis not present

## 2018-05-11 DIAGNOSIS — N289 Disorder of kidney and ureter, unspecified: Secondary | ICD-10-CM | POA: Diagnosis not present

## 2018-05-11 DIAGNOSIS — M109 Gout, unspecified: Secondary | ICD-10-CM | POA: Diagnosis not present

## 2018-05-11 DIAGNOSIS — Z23 Encounter for immunization: Secondary | ICD-10-CM | POA: Diagnosis not present

## 2018-05-11 DIAGNOSIS — I129 Hypertensive chronic kidney disease with stage 1 through stage 4 chronic kidney disease, or unspecified chronic kidney disease: Secondary | ICD-10-CM | POA: Diagnosis not present

## 2018-06-21 ENCOUNTER — Ambulatory Visit (INDEPENDENT_AMBULATORY_CARE_PROVIDER_SITE_OTHER): Payer: Medicare Other | Admitting: Internal Medicine

## 2018-06-21 ENCOUNTER — Encounter: Payer: Self-pay | Admitting: Internal Medicine

## 2018-06-21 VITALS — BP 126/62 | HR 95 | Ht 61.0 in | Wt 134.0 lb

## 2018-06-21 DIAGNOSIS — I10 Essential (primary) hypertension: Secondary | ICD-10-CM

## 2018-06-21 DIAGNOSIS — Z950 Presence of cardiac pacemaker: Secondary | ICD-10-CM

## 2018-06-21 DIAGNOSIS — I495 Sick sinus syndrome: Secondary | ICD-10-CM

## 2018-06-21 LAB — CUP PACEART INCLINIC DEVICE CHECK
Battery Impedance: 2088 Ohm
Brady Statistic AS VS Percent: 0 %
Date Time Interrogation Session: 20191230150203
Implantable Lead Implant Date: 20050110
Implantable Lead Location: 753859
Implantable Lead Location: 753860
Implantable Pulse Generator Implant Date: 20120711
Lead Channel Impedance Value: 567 Ohm
Lead Channel Pacing Threshold Amplitude: 0.625 V
Lead Channel Pacing Threshold Pulse Width: 0.4 ms
Lead Channel Setting Pacing Amplitude: 2.5 V
Lead Channel Setting Pacing Pulse Width: 0.4 ms
Lead Channel Setting Sensing Sensitivity: 2.8 mV
MDC IDC LEAD IMPLANT DT: 20050110
MDC IDC MSMT BATTERY REMAINING LONGEVITY: 28 mo
MDC IDC MSMT BATTERY VOLTAGE: 2.74 V
MDC IDC MSMT LEADCHNL RA IMPEDANCE VALUE: 434 Ohm
MDC IDC MSMT LEADCHNL RA PACING THRESHOLD AMPLITUDE: 0.75 V
MDC IDC MSMT LEADCHNL RA PACING THRESHOLD PULSEWIDTH: 0.4 ms
MDC IDC SET LEADCHNL RA PACING AMPLITUDE: 2 V
MDC IDC STAT BRADY AP VP PERCENT: 7 %
MDC IDC STAT BRADY AP VS PERCENT: 93 %
MDC IDC STAT BRADY AS VP PERCENT: 0 %

## 2018-06-21 NOTE — Patient Instructions (Signed)
Medication Instructions:  Your physician recommends that you continue on your current medications as directed. Please refer to the Current Medication list given to you today.  Labwork: None ordered.  Testing/Procedures: None ordered.  Follow-Up: Your physician wants you to follow-up in: 1 year with Dr. Lovena Le. You will receive a reminder letter in the mail two months in advance. If you don't receive a letter, please call our office to schedule the follow-up appointment.  Remote monitoring is used to monitor your Pacemaker from home. This monitoring reduces the number of office visits required to check your device to one time per year. It allows [place] to keep an eye on the functioning of your device to ensure it is working properly. You are scheduled for a device check from home on 07/26/2018. You may send your transmission at any time that day. If you have a wireless device, the transmission will be sent automatically. After your physician reviews your transmission, you will receive a postcard with your next transmission date.  Any Other Special Instructions Will Be Listed Below (If Applicable).  If you need a refill on your cardiac medications before your next appointment, please call your pharmacy.
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
## 2018-06-24 LAB — CUP PACEART REMOTE DEVICE CHECK
Battery Impedance: 2029 Ohm
Battery Voltage: 2.75 V
Brady Statistic AP VP Percent: 1 %
Brady Statistic AP VS Percent: 99 %
Brady Statistic AS VP Percent: 0 %
Brady Statistic AS VS Percent: 0 %
Date Time Interrogation Session: 20191104151414
Implantable Lead Implant Date: 20050110
Implantable Lead Location: 753860
Implantable Lead Model: 5076
Implantable Lead Model: 5076
Lead Channel Impedance Value: 567 Ohm
Lead Channel Pacing Threshold Amplitude: 0.625 V
Lead Channel Pacing Threshold Pulse Width: 0.4 ms
Lead Channel Setting Pacing Amplitude: 2.5 V
Lead Channel Setting Pacing Pulse Width: 0.4 ms
MDC IDC LEAD IMPLANT DT: 20050110
MDC IDC LEAD LOCATION: 753859
MDC IDC MSMT BATTERY REMAINING LONGEVITY: 29 mo
MDC IDC MSMT LEADCHNL RA IMPEDANCE VALUE: 402 Ohm
MDC IDC MSMT LEADCHNL RV PACING THRESHOLD AMPLITUDE: 0.625 V
MDC IDC MSMT LEADCHNL RV PACING THRESHOLD PULSEWIDTH: 0.4 ms
MDC IDC PG IMPLANT DT: 20120711
MDC IDC SET LEADCHNL RA PACING AMPLITUDE: 2 V
MDC IDC SET LEADCHNL RV SENSING SENSITIVITY: 4 mV

## 2018-07-26 ENCOUNTER — Ambulatory Visit: Payer: Medicare Other

## 2018-07-27 ENCOUNTER — Ambulatory Visit (INDEPENDENT_AMBULATORY_CARE_PROVIDER_SITE_OTHER): Payer: Medicare Other

## 2018-07-27 DIAGNOSIS — I495 Sick sinus syndrome: Secondary | ICD-10-CM | POA: Diagnosis not present

## 2018-07-30 LAB — CUP PACEART REMOTE DEVICE CHECK
Battery Impedance: 2135 Ohm
Battery Remaining Longevity: 23 mo
Battery Voltage: 2.73 V
Brady Statistic AP VP Percent: 97 %
Brady Statistic AP VS Percent: 2 %
Brady Statistic AS VP Percent: 0 %
Brady Statistic AS VS Percent: 0 %
Date Time Interrogation Session: 20200204183814
Implantable Lead Implant Date: 20050110
Implantable Lead Implant Date: 20050110
Implantable Lead Location: 753859
Implantable Lead Location: 753860
Implantable Lead Model: 5076
Implantable Pulse Generator Implant Date: 20120711
Lead Channel Impedance Value: 401 Ohm
Lead Channel Impedance Value: 521 Ohm
Lead Channel Pacing Threshold Amplitude: 0.625 V
Lead Channel Pacing Threshold Pulse Width: 0.4 ms
Lead Channel Setting Pacing Amplitude: 2 V
Lead Channel Setting Pacing Amplitude: 2.5 V
Lead Channel Setting Pacing Pulse Width: 0.4 ms
Lead Channel Setting Sensing Sensitivity: 2 mV
MDC IDC MSMT LEADCHNL RA PACING THRESHOLD PULSEWIDTH: 0.4 ms
MDC IDC MSMT LEADCHNL RV PACING THRESHOLD AMPLITUDE: 0.625 V

## 2018-08-02 DIAGNOSIS — D631 Anemia in chronic kidney disease: Secondary | ICD-10-CM | POA: Diagnosis not present

## 2018-08-02 DIAGNOSIS — N184 Chronic kidney disease, stage 4 (severe): Secondary | ICD-10-CM | POA: Diagnosis not present

## 2018-08-02 DIAGNOSIS — N2581 Secondary hyperparathyroidism of renal origin: Secondary | ICD-10-CM | POA: Diagnosis not present

## 2018-08-02 DIAGNOSIS — I129 Hypertensive chronic kidney disease with stage 1 through stage 4 chronic kidney disease, or unspecified chronic kidney disease: Secondary | ICD-10-CM | POA: Diagnosis not present

## 2018-08-05 NOTE — Progress Notes (Signed)
Remote pacemaker transmission.   

## 2018-08-09 ENCOUNTER — Other Ambulatory Visit: Payer: Self-pay | Admitting: Internal Medicine

## 2018-08-18 ENCOUNTER — Telehealth: Payer: Self-pay

## 2018-08-18 MED ORDER — LEVOTHYROXINE SODIUM 88 MCG PO TABS: ORAL_TABLET | ORAL | 1 refills | Status: DC

## 2018-08-18 NOTE — Telephone Encounter (Signed)
[facility] is wanting a refill on Levothyroxine 88 mcg one po Q am for thyroid. #30.  Patient last seen 06/2017 for Htn. Please advise.

## 2018-08-18 NOTE — Telephone Encounter (Signed)
Patient may have this +1 additional refill patient does need an office visit

## 2018-11-02 ENCOUNTER — Ambulatory Visit (INDEPENDENT_AMBULATORY_CARE_PROVIDER_SITE_OTHER): Payer: Medicare Other | Admitting: *Deleted

## 2018-11-02 ENCOUNTER — Other Ambulatory Visit: Payer: Self-pay

## 2018-11-02 DIAGNOSIS — I495 Sick sinus syndrome: Secondary | ICD-10-CM | POA: Diagnosis not present

## 2018-11-03 LAB — CUP PACEART REMOTE DEVICE CHECK
Battery Impedance: 2197 Ohm
Battery Remaining Longevity: 23 mo
Battery Voltage: 2.73 V
Brady Statistic AP VP Percent: 98 %
Brady Statistic AP VS Percent: 1 %
Brady Statistic AS VP Percent: 0 %
Brady Statistic AS VS Percent: 0 %
Date Time Interrogation Session: 20200512163300
Implantable Lead Implant Date: 20050110
Implantable Lead Implant Date: 20050110
Implantable Lead Location: 753859
Implantable Lead Location: 753860
Implantable Lead Model: 5076
Implantable Lead Model: 5076
Implantable Pulse Generator Implant Date: 20120711
Lead Channel Impedance Value: 430 Ohm
Lead Channel Impedance Value: 517 Ohm
Lead Channel Pacing Threshold Amplitude: 0.5 V
Lead Channel Pacing Threshold Amplitude: 0.75 V
Lead Channel Pacing Threshold Pulse Width: 0.4 ms
Lead Channel Pacing Threshold Pulse Width: 0.4 ms
Lead Channel Setting Pacing Amplitude: 2 V
Lead Channel Setting Pacing Amplitude: 2.5 V
Lead Channel Setting Pacing Pulse Width: 0.4 ms
Lead Channel Setting Sensing Sensitivity: 2.8 mV

## 2018-11-08 ENCOUNTER — Emergency Department (HOSPITAL_COMMUNITY)
Admission: EM | Admit: 2018-11-08 | Discharge: 2018-11-08 | Disposition: A | Discharge status: Home / Self Care | Payer: Medicare Other | Attending: Emergency Medicine | Admitting: Emergency Medicine

## 2018-11-08 ENCOUNTER — Encounter (HOSPITAL_COMMUNITY): Payer: Self-pay | Admitting: Emergency Medicine

## 2018-11-08 ENCOUNTER — Telehealth: Payer: Self-pay | Admitting: Family Medicine

## 2018-11-08 ENCOUNTER — Other Ambulatory Visit: Payer: Self-pay

## 2018-11-08 DIAGNOSIS — Z951 Presence of aortocoronary bypass graft: Secondary | ICD-10-CM | POA: Diagnosis not present

## 2018-11-08 DIAGNOSIS — I10 Essential (primary) hypertension: Secondary | ICD-10-CM | POA: Diagnosis not present

## 2018-11-08 DIAGNOSIS — I252 Old myocardial infarction: Secondary | ICD-10-CM | POA: Diagnosis not present

## 2018-11-08 DIAGNOSIS — Z950 Presence of cardiac pacemaker: Secondary | ICD-10-CM | POA: Diagnosis not present

## 2018-11-08 DIAGNOSIS — Y999 Unspecified external cause status: Secondary | ICD-10-CM | POA: Diagnosis not present

## 2018-11-08 DIAGNOSIS — X58XXXA Exposure to other specified factors, initial encounter: Secondary | ICD-10-CM | POA: Diagnosis not present

## 2018-11-08 DIAGNOSIS — T148XXA Other injury of unspecified body region, initial encounter: Secondary | ICD-10-CM | POA: Diagnosis not present

## 2018-11-08 DIAGNOSIS — I2510 Atherosclerotic heart disease of native coronary artery without angina pectoris: Secondary | ICD-10-CM | POA: Insufficient documentation

## 2018-11-08 DIAGNOSIS — Y939 Activity, unspecified: Secondary | ICD-10-CM | POA: Diagnosis not present

## 2018-11-08 DIAGNOSIS — E039 Hypothyroidism, unspecified: Secondary | ICD-10-CM | POA: Insufficient documentation

## 2018-11-08 DIAGNOSIS — Z87891 Personal history of nicotine dependence: Secondary | ICD-10-CM | POA: Diagnosis not present

## 2018-11-08 DIAGNOSIS — S8012XA Contusion of left lower leg, initial encounter: Secondary | ICD-10-CM | POA: Diagnosis not present

## 2018-11-08 DIAGNOSIS — Y929 Unspecified place or not applicable: Secondary | ICD-10-CM | POA: Diagnosis not present

## 2018-11-08 DIAGNOSIS — S8011XA Contusion of right lower leg, initial encounter: Secondary | ICD-10-CM | POA: Diagnosis not present

## 2018-11-08 DIAGNOSIS — Z85038 Personal history of other malignant neoplasm of large intestine: Secondary | ICD-10-CM | POA: Diagnosis not present

## 2018-11-08 LAB — BASIC METABOLIC PANEL
Anion gap: 12 (ref 5–15)
BUN: 36 mg/dL — ABNORMAL HIGH (ref 8–23)
CO2: 26 mmol/L (ref 22–32)
Calcium: 9.5 mg/dL (ref 8.9–10.3)
Chloride: 98 mmol/L (ref 98–111)
Creatinine, Ser: 2.15 mg/dL — ABNORMAL HIGH (ref 0.44–1.00)
GFR calc Af Amer: 22 mL/min — ABNORMAL LOW (ref >60)
GFR calc non Af Amer: 19 mL/min — ABNORMAL LOW (ref >60)
Glucose, Bld: 100 mg/dL — ABNORMAL HIGH (ref 70–99)
Potassium: 4.3 mmol/L (ref 3.5–5.1)
Sodium: 136 mmol/L (ref 135–145)

## 2018-11-08 LAB — APTT: aPTT: 27 seconds (ref 24–36)

## 2018-11-08 LAB — CBC
HCT: 38.4 % (ref 36.0–46.0)
Hemoglobin: 11.9 g/dL — ABNORMAL LOW (ref 12.0–15.0)
MCH: 32.7 pg (ref 26.0–34.0)
MCHC: 31 g/dL (ref 30.0–36.0)
MCV: 105.5 fL — ABNORMAL HIGH (ref 80.0–100.0)
Platelets: 172 10*3/uL (ref 150–400)
RBC: 3.64 MIL/uL — ABNORMAL LOW (ref 3.87–5.11)
RDW: 14.8 % (ref 11.5–15.5)
WBC: 8.1 10*3/uL (ref 4.0–10.5)
nRBC: 0 % (ref 0.0–0.2)

## 2018-11-08 LAB — PROTIME-INR
INR: 0.9 (ref 0.8–1.2)
Prothrombin Time: 12.4 seconds (ref 11.4–15.2)
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
## 2018-11-08 NOTE — Telephone Encounter (Signed)
Pt son-in-law contacted office to inform us that pt legs were black and blue from the knees to the ankles with some swelling. Family noticed the bruising last night. Pt has not had any recent falls. Pt son in law instructed to take pt to ER for evaluation.

## 2018-11-08 NOTE — Discharge Instructions (Addendum)
You were evaluated in the Emergency Department and after careful evaluation, we did not find any emergent condition requiring admission or further testing in the hospital.  Your testing today was very reassuring and did not show any other more concerning causes of the bruising.  Please return to the Emergency Department if you experience any worsening of your condition.  We encourage you to follow up with a primary care provider.  Thank you for allowing [place] to be a part of your care.
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
## 2018-11-19 NOTE — Progress Notes (Signed)
Remote pacemaker transmission.   

## 2018-11-29 ENCOUNTER — Other Ambulatory Visit: Payer: Self-pay | Admitting: Family Medicine

## 2018-12-02 ENCOUNTER — Other Ambulatory Visit: Payer: Self-pay | Admitting: Family Medicine

## 2018-12-02 DIAGNOSIS — E038 Other specified hypothyroidism: Secondary | ICD-10-CM

## 2018-12-02 NOTE — Telephone Encounter (Signed)
Patient appointment has been schedule 6/30 for phone visit .

## 2018-12-02 NOTE — Telephone Encounter (Signed)
Please advise. Thank you

## 2018-12-02 NOTE — Telephone Encounter (Signed)
Please contact patient and schedule visit. Then route to nurses. Thank you

## 2018-12-03 NOTE — Telephone Encounter (Signed)
May have this +1 refill Needs to do TSH Keep follow-up virtual visit

## 2018-12-06 MED ORDER — LEVOTHYROXINE SODIUM 88 MCG PO TABS: ORAL_TABLET | ORAL | 0 refills | Status: DC

## 2018-12-06 NOTE — Addendum Note (Signed)
Addended by: Dairl Ponder on: 12/06/2018 02:16 PM   Modules accepted: Orders

## 2018-12-06 NOTE — Telephone Encounter (Signed)
Pt has appt on 6/30

## 2018-12-16 DIAGNOSIS — E038 Other specified hypothyroidism: Secondary | ICD-10-CM | POA: Diagnosis not present

## 2018-12-17 ENCOUNTER — Telehealth: Payer: Self-pay | Admitting: Internal Medicine

## 2018-12-17 LAB — TSH: TSH: 1.17 u[IU]/mL (ref 0.450–4.500)

## 2018-12-17 NOTE — Telephone Encounter (Signed)
New message:   Is pt supposed to be taking Crestor? If so, please call it in for her.

## 2018-12-17 NOTE — Telephone Encounter (Signed)
No answer.  Pt not on Crestor per review of most recent med rec.  Will f/u.

## 2018-12-21 ENCOUNTER — Other Ambulatory Visit: Payer: Self-pay

## 2018-12-21 ENCOUNTER — Ambulatory Visit (INDEPENDENT_AMBULATORY_CARE_PROVIDER_SITE_OTHER): Payer: Medicare Other | Admitting: Family Medicine

## 2018-12-21 DIAGNOSIS — E038 Other specified hypothyroidism: Secondary | ICD-10-CM

## 2018-12-21 DIAGNOSIS — I10 Essential (primary) hypertension: Secondary | ICD-10-CM

## 2018-12-21 MED ORDER — LEVOTHYROXINE SODIUM 88 MCG PO TABS: ORAL_TABLET | ORAL | 12 refills | Status: DC

## 2018-12-21 NOTE — Progress Notes (Signed)
   Subjective:    Patient ID: Alexandra Hall, female    DOB: Jun 07, 1922, 85 y.o.   MRN: [id number]  Hypertension This is a chronic problem. The current episode started more than 1 year ago. Pertinent negatives include no chest pain, headaches or shortness of breath. Risk factors for coronary artery disease include post-menopausal state and sedentary lifestyle. Treatments tried: lasix, toprol. There are no compliance problems.    Doing well   Patient has thyroid condition.  Takes thyroid medication on a regular basis.  States that the proper way.  Relates compliance.  States no negative side effects.  States condition seems to be under good control.   Virtual Visit via Video Note  I connected with Alexandra Hall on 12/21/18 at  1:10 PM EDT by a video enabled telemedicine application and verified that I am speaking with the correct person using two identifiers.  Location: Patient: home Provider: office   I discussed the limitations of evaluation and management by telemedicine and the availability of in person appointments. The patient expressed understanding and agreed to proceed.  History of Present Illness:    Observations/Objective:   Assessment and Plan:   Follow Up Instructions:    I discussed the assessment and treatment plan with the patient. The patient was provided an opportunity to ask questions and all were answered. The patient agreed with the plan and demonstrated an understanding of the instructions.   The patient was advised to call back or seek an in-person evaluation if the symptoms worsen or if the condition fails to improve as anticipated.  I provided 15 minutes of non-face-to-face time during this encounter.       Review of Systems  Constitutional: Negative for activity change, fatigue and fever.  HENT: Negative for congestion and rhinorrhea.   Respiratory: Negative for cough, chest tightness and shortness of breath.   Cardiovascular: Negative for chest  pain and leg swelling.  Gastrointestinal: Negative for abdominal pain and nausea.  Skin: Negative for color change.  Neurological: Negative for dizziness and headaches.  Psychiatric/Behavioral: Negative for agitation and behavioral problems.       Objective:   Physical Exam  Today's visit was via telephone Physical exam was not possible for this visit       Assessment & Plan:  Hypothyroidism-under good control recent lab work looks good continue current medication watch diet closely  Overall doing very well for 86 years of age.  Patient did not want to talk much on the phone her son did a lot of assistance for her definitely follow-up within 1 year but follow-up sooner if any health problems or issues

## 2018-12-22 NOTE — Telephone Encounter (Signed)
Spoke with EC.  Advised per Dr. Lovena Le Pt does not need to be on a statin.  EC indicates understanding.

## 2019-02-01 ENCOUNTER — Ambulatory Visit (INDEPENDENT_AMBULATORY_CARE_PROVIDER_SITE_OTHER): Payer: Medicare Other | Admitting: *Deleted

## 2019-02-01 DIAGNOSIS — I495 Sick sinus syndrome: Secondary | ICD-10-CM | POA: Diagnosis not present

## 2019-02-03 LAB — CUP PACEART REMOTE DEVICE CHECK
Battery Impedance: 2450 Ohm
Battery Remaining Longevity: 20 mo
Battery Voltage: 2.73 V
Brady Statistic AP VP Percent: 97 %
Brady Statistic AP VS Percent: 1 %
Brady Statistic AS VP Percent: 1 %
Brady Statistic AS VS Percent: 0 %
Date Time Interrogation Session: 20200812182406
Implantable Lead Implant Date: 20050110
Implantable Lead Implant Date: 20050110
Implantable Lead Location: 753859
Implantable Lead Location: 753860
Implantable Lead Model: 5076
Implantable Lead Model: 5076
Implantable Pulse Generator Implant Date: 20120711
Lead Channel Impedance Value: 456 Ohm
Lead Channel Impedance Value: 551 Ohm
Lead Channel Pacing Threshold Amplitude: 0.75 V
Lead Channel Pacing Threshold Amplitude: 0.75 V
Lead Channel Pacing Threshold Pulse Width: 0.4 ms
Lead Channel Pacing Threshold Pulse Width: 0.4 ms
Lead Channel Setting Pacing Amplitude: 2 V
Lead Channel Setting Pacing Amplitude: 2.5 V
Lead Channel Setting Pacing Pulse Width: 0.4 ms
Lead Channel Setting Sensing Sensitivity: 2.8 mV

## 2019-02-09 ENCOUNTER — Encounter: Payer: Self-pay | Admitting: Cardiology

## 2019-02-09 NOTE — Progress Notes (Signed)
Remote pacemaker transmission.   

## 2019-03-21 DIAGNOSIS — D631 Anemia in chronic kidney disease: Secondary | ICD-10-CM | POA: Diagnosis not present

## 2019-03-21 DIAGNOSIS — N184 Chronic kidney disease, stage 4 (severe): Secondary | ICD-10-CM | POA: Diagnosis not present

## 2019-03-21 DIAGNOSIS — I129 Hypertensive chronic kidney disease with stage 1 through stage 4 chronic kidney disease, or unspecified chronic kidney disease: Secondary | ICD-10-CM | POA: Diagnosis not present

## 2019-03-21 DIAGNOSIS — N2581 Secondary hyperparathyroidism of renal origin: Secondary | ICD-10-CM | POA: Diagnosis not present

## 2019-05-03 ENCOUNTER — Encounter: Payer: Medicare Other | Admitting: *Deleted

## 2019-05-05 ENCOUNTER — Telehealth: Payer: Self-pay

## 2019-05-05 NOTE — Telephone Encounter (Signed)
Spoke with patient to remind of missed remote transmission LL

## 2019-05-13 ENCOUNTER — Other Ambulatory Visit: Payer: Self-pay

## 2019-05-13 DIAGNOSIS — Z23 Encounter for immunization: Secondary | ICD-10-CM | POA: Diagnosis not present

## 2019-05-27 ENCOUNTER — Other Ambulatory Visit: Payer: Self-pay | Admitting: Family Medicine

## 2019-05-27 DIAGNOSIS — R270 Ataxia, unspecified: Secondary | ICD-10-CM

## 2019-06-01 ENCOUNTER — Telehealth: Payer: Self-pay | Admitting: Family Medicine

## 2019-06-01 NOTE — Telephone Encounter (Signed)
Pt needs face to face for home health referral order & another diagnosis as ataxia isn't enough  Please see note below from [place] please advise    We are needing another DX code, as Ataxia won't work anymore under the PDGM changes. This is another new thing we are encountering with Home Health orders. Medicare wants a more specific DX code now than the Ataxia. Do we know what's causing the Ataxia?   Also SOC will be for the 12th, so I'll also need an okay from the MD for this.

## 2019-06-01 NOTE — Telephone Encounter (Signed)
Alexandra Hall does face to face mean patient has to come to the office? Or can this be done via phone Or via video Please see what home health has to say about that thank you

## 2019-06-02 NOTE — Telephone Encounter (Signed)
The rep with [facility] states that a virtual visit can be used for the face to face but it needs to have a video component to meet requirements

## 2019-06-07 ENCOUNTER — Encounter: Payer: Self-pay | Admitting: Internal Medicine

## 2019-06-07 ENCOUNTER — Other Ambulatory Visit: Payer: Self-pay

## 2019-06-07 ENCOUNTER — Ambulatory Visit (INDEPENDENT_AMBULATORY_CARE_PROVIDER_SITE_OTHER): Payer: Medicare Other | Admitting: Internal Medicine

## 2019-06-07 VITALS — BP 147/77 | HR 87 | Temp 96.5°F | Ht 60.0 in | Wt 122.0 lb

## 2019-06-07 DIAGNOSIS — Z950 Presence of cardiac pacemaker: Secondary | ICD-10-CM

## 2019-06-07 DIAGNOSIS — I495 Sick sinus syndrome: Secondary | ICD-10-CM

## 2019-06-07 DIAGNOSIS — I10 Essential (primary) hypertension: Secondary | ICD-10-CM

## 2019-06-07 NOTE — Patient Instructions (Signed)
Medication Instructions:  Your physician recommends that you continue on your current medications as directed. Please refer to the Current Medication list given to you today.  *If you need a refill on your cardiac medications before your next appointment, please call your pharmacy*  Lab Work: None  If you have labs (blood work) drawn today and your tests are completely normal, you will receive your results only by: Marland Kitchen MyChart Message (if you have MyChart) OR . A paper copy in the mail If you have any lab test that is abnormal or we need to change your treatment, we will call you to review the results.  Testing/Procedures: NONE    Follow-Up: At [facility], you and your health needs are our priority.  As part of our continuing mission to provide you with exceptional heart care, we have created designated Provider Care Teams.  These Care Teams include your primary Cardiologist (physician) and Advanced Practice Providers (APPs -  Physician Assistants and Nurse Practitioners) who all work together to provide you with the care you need, when you need it.  Your next appointment:   1 year(s)  The format for your next appointment:   In Person  Provider:   Cristopher Peru, MD  Other Instructions Thank you for choosing [facility]!
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
## 2019-06-10 NOTE — Telephone Encounter (Signed)
I looked back at recent office notes last office visit was in June and was not face-to-face  In order to meet all of these requirements it would require a face-to-face office visit or a video office visit Certainly if none of that is possible I could even do a brief car visit to satisfy this  Certainly if family does not want to continue home health then we would not have to do any of that above  (I highly recommend compassionately and carefully explaining what is going on to the family and how in order to reorder/continue the home health-home health is needing a face-to-face because of Medicare guidelines-there is a possibility that family may be very frustrated by all of this but it is outside of our area of control-we are more than willing to help but I have to be above the board in all doing exam documentation)

## 2019-06-21 NOTE — Telephone Encounter (Signed)
Discussed with family  Car visit scheduled for 07/08/2019

## 2019-07-08 ENCOUNTER — Ambulatory Visit: Payer: Medicare Other | Admitting: Family Medicine

## 2019-07-12 ENCOUNTER — Telehealth: Payer: Self-pay | Admitting: Family Medicine

## 2019-07-12 NOTE — Telephone Encounter (Signed)
Please go ahead and PT as requested Diagnosis difficulty walking and balance

## 2019-07-12 NOTE — Telephone Encounter (Signed)
Can we add PT to pt's home health order?  See note below & please advise     Alexandra Hall,   Alexandra Hall soonest we can open this patient with Nursing is going to be Friday and I'll need an updated order that says that Alexandra Hall date is okay since it's outside the 48 hour window. We could open sooner with PT if you wanted to add that service.   Please let me know,   Thanks,   [facility]

## 2019-07-13 NOTE — Telephone Encounter (Signed)
Info sent to [facility]

## 2019-07-15 ENCOUNTER — Telehealth: Payer: Self-pay | Admitting: Family Medicine

## 2019-07-15 NOTE — Telephone Encounter (Signed)
FYI-Home health wanting you to know that patient is refusing home care due to [facility]

## 2019-07-15 NOTE — Telephone Encounter (Signed)
So be it that is fine

## 2019-07-27 ENCOUNTER — Other Ambulatory Visit: Payer: Self-pay | Admitting: Internal Medicine

## 2019-08-03 ENCOUNTER — Telehealth: Payer: Self-pay

## 2019-08-03 NOTE — Telephone Encounter (Signed)
Spoke with patient to remind of missed remote transmission 

## 2019-08-06 ENCOUNTER — Emergency Department (HOSPITAL_COMMUNITY): Payer: Medicare Other

## 2019-08-06 ENCOUNTER — Other Ambulatory Visit: Payer: Self-pay

## 2019-08-06 ENCOUNTER — Encounter (HOSPITAL_COMMUNITY): Payer: Self-pay | Admitting: Emergency Medicine

## 2019-08-06 ENCOUNTER — Emergency Department (HOSPITAL_COMMUNITY)
Admission: EM | Admit: 2019-08-06 | Discharge: 2019-08-06 | Disposition: A | Discharge status: Home / Self Care | Payer: Medicare Other | Attending: Emergency Medicine | Admitting: Emergency Medicine

## 2019-08-06 DIAGNOSIS — R220 Localized swelling, mass and lump, head: Secondary | ICD-10-CM | POA: Insufficient documentation

## 2019-08-06 DIAGNOSIS — Y999 Unspecified external cause status: Secondary | ICD-10-CM | POA: Insufficient documentation

## 2019-08-06 DIAGNOSIS — W19XXXA Unspecified fall, initial encounter: Secondary | ICD-10-CM

## 2019-08-06 DIAGNOSIS — Y92003 Bedroom of unspecified non-institutional (private) residence as the place of occurrence of the external cause: Secondary | ICD-10-CM | POA: Insufficient documentation

## 2019-08-06 DIAGNOSIS — I10 Essential (primary) hypertension: Secondary | ICD-10-CM | POA: Insufficient documentation

## 2019-08-06 DIAGNOSIS — R0902 Hypoxemia: Secondary | ICD-10-CM | POA: Diagnosis not present

## 2019-08-06 DIAGNOSIS — Z79899 Other long term (current) drug therapy: Secondary | ICD-10-CM | POA: Diagnosis not present

## 2019-08-06 DIAGNOSIS — S0083XA Contusion of other part of head, initial encounter: Secondary | ICD-10-CM | POA: Diagnosis not present

## 2019-08-06 DIAGNOSIS — E860 Dehydration: Secondary | ICD-10-CM | POA: Diagnosis not present

## 2019-08-06 DIAGNOSIS — S43112A Subluxation of left acromioclavicular joint, initial encounter: Secondary | ICD-10-CM | POA: Insufficient documentation

## 2019-08-06 DIAGNOSIS — Y9389 Activity, other specified: Secondary | ICD-10-CM | POA: Insufficient documentation

## 2019-08-06 DIAGNOSIS — R000 Tachycardia, unspecified: Secondary | ICD-10-CM | POA: Diagnosis not present

## 2019-08-06 DIAGNOSIS — E039 Hypothyroidism, unspecified: Secondary | ICD-10-CM | POA: Insufficient documentation

## 2019-08-06 DIAGNOSIS — Z7982 Long term (current) use of aspirin: Secondary | ICD-10-CM | POA: Diagnosis not present

## 2019-08-06 DIAGNOSIS — S43102A Unspecified dislocation of left acromioclavicular joint, initial encounter: Secondary | ICD-10-CM | POA: Diagnosis not present

## 2019-08-06 DIAGNOSIS — F0390 Unspecified dementia without behavioral disturbance: Secondary | ICD-10-CM | POA: Insufficient documentation

## 2019-08-06 DIAGNOSIS — M542 Cervicalgia: Secondary | ICD-10-CM | POA: Diagnosis not present

## 2019-08-06 DIAGNOSIS — S0003XA Contusion of scalp, initial encounter: Secondary | ICD-10-CM | POA: Diagnosis not present

## 2019-08-06 DIAGNOSIS — Z87891 Personal history of nicotine dependence: Secondary | ICD-10-CM | POA: Diagnosis not present

## 2019-08-06 DIAGNOSIS — S40012A Contusion of left shoulder, initial encounter: Secondary | ICD-10-CM | POA: Diagnosis not present

## 2019-08-06 DIAGNOSIS — W01198A Fall on same level from slipping, tripping and stumbling with subsequent striking against other object, initial encounter: Secondary | ICD-10-CM | POA: Insufficient documentation

## 2019-08-06 DIAGNOSIS — S43005A Unspecified dislocation of left shoulder joint, initial encounter: Secondary | ICD-10-CM

## 2019-08-06 DIAGNOSIS — K118 Other diseases of salivary glands: Secondary | ICD-10-CM

## 2019-08-06 DIAGNOSIS — S0990XA Unspecified injury of head, initial encounter: Secondary | ICD-10-CM | POA: Diagnosis not present

## 2019-08-06 LAB — URINALYSIS, ROUTINE W REFLEX MICROSCOPIC
Bacteria, UA: NONE SEEN
Bilirubin Urine: NEGATIVE
Glucose, UA: NEGATIVE mg/dL
Hgb urine dipstick: NEGATIVE
Ketones, ur: NEGATIVE mg/dL
Leukocytes,Ua: NEGATIVE
Nitrite: NEGATIVE
Protein, ur: 30 mg/dL — AB
Specific Gravity, Urine: 1.014 (ref 1.005–1.030)
pH: 5 (ref 5.0–8.0)

## 2019-08-06 LAB — CBC WITH DIFFERENTIAL/PLATELET
Abs Immature Granulocytes: 0.05 10*3/uL (ref 0.00–0.07)
Basophils Absolute: 0.1 10*3/uL (ref 0.0–0.1)
Basophils Relative: 1 %
Eosinophils Absolute: 0.1 10*3/uL (ref 0.0–0.5)
Eosinophils Relative: 1 %
HCT: 38.6 % (ref 36.0–46.0)
Hemoglobin: 12 g/dL (ref 12.0–15.0)
Immature Granulocytes: 1 %
Lymphocytes Relative: 11 %
Lymphs Abs: 1.1 10*3/uL (ref 0.7–4.0)
MCH: 33.4 pg (ref 26.0–34.0)
MCHC: 31.1 g/dL (ref 30.0–36.0)
MCV: 107.5 fL — ABNORMAL HIGH (ref 80.0–100.0)
Monocytes Absolute: 0.4 10*3/uL (ref 0.1–1.0)
Monocytes Relative: 4 %
Neutro Abs: 8.1 10*3/uL — ABNORMAL HIGH (ref 1.7–7.7)
Neutrophils Relative %: 82 %
Platelets: 133 10*3/uL — ABNORMAL LOW (ref 150–400)
RBC: 3.59 MIL/uL — ABNORMAL LOW (ref 3.87–5.11)
RDW: 14.5 % (ref 11.5–15.5)
WBC: 9.7 10*3/uL (ref 4.0–10.5)
nRBC: 0 % (ref 0.0–0.2)

## 2019-08-06 LAB — COMPREHENSIVE METABOLIC PANEL
ALT: 13 U/L (ref 0–44)
AST: 20 U/L (ref 15–41)
Albumin: 3.4 g/dL — ABNORMAL LOW (ref 3.5–5.0)
Alkaline Phosphatase: 52 U/L (ref 38–126)
Anion gap: 12 (ref 5–15)
BUN: 35 mg/dL — ABNORMAL HIGH (ref 8–23)
CO2: 27 mmol/L (ref 22–32)
Calcium: 10.8 mg/dL — ABNORMAL HIGH (ref 8.9–10.3)
Chloride: 100 mmol/L (ref 98–111)
Creatinine, Ser: 2.64 mg/dL — ABNORMAL HIGH (ref 0.44–1.00)
GFR calc Af Amer: 17 mL/min — ABNORMAL LOW (ref >60)
GFR calc non Af Amer: 15 mL/min — ABNORMAL LOW (ref >60)
Glucose, Bld: 190 mg/dL — ABNORMAL HIGH (ref 70–99)
Potassium: 3.8 mmol/L (ref 3.5–5.1)
Sodium: 139 mmol/L (ref 135–145)
Total Bilirubin: 0.5 mg/dL (ref 0.3–1.2)
Total Protein: 6.6 g/dL (ref 6.5–8.1)

## 2019-08-06 MED ORDER — SODIUM CHLORIDE 0.9 % IV BOLUS
500.0000 mL | Freq: Once | INTRAVENOUS | Status: AC
  Administered 2019-08-06: 14:00:00 500 mL via INTRAVENOUS

## 2019-08-06 NOTE — ED Triage Notes (Signed)
Pt here for a fall where she struck her head on doorframe after tripping. No LOC. Pt has some dementia and denies complaints.

## 2019-08-06 NOTE — ED Notes (Signed)
C/o left shoulder pain.  Bruise noted at site.

## 2019-08-06 NOTE — ED Notes (Signed)
Fell at home this am.  Bruise noted to left forehead, denies pain.  Pt taking ASA daily.

## 2019-08-06 NOTE — ED Provider Notes (Addendum)
3:30 PM-checkout from Dr. Verner Chol to evaluate urinalysis then consider discharge.  Patient was evaluated for fall.  Unclear why she fell.  So far trauma work-up is reassuring.  Screening labs are reassuring with some evidence for mild AKI, which was addressed with IV saline bolus..  Urinalysis ordered to evaluate for occult urinary tract infection.  Urinalysis is not consistent with UTI.  It is normal with the exception of mild elevation of protein.  Cervical spine images, CT showed  slight enlargement of a left parotid gland soft tissue mass.  I discussed all the findings with the patient's daughter who is with her.  She states that she will help patient at home.  She is aware of the left parotid gland lesion, and I asked her to follow-up with her primary care doctor about work-up versus observation.  No other questions were expressed, and the patient was deemed stable for discharge.   Daleen Bo, MD 08/06/19 1616   After initial discharge, the patient's daughter asked about the patient's left shoulder which now appears to have bruising on it.  I evaluated the patient, and she has tenderness at the left [facility] joint, and diffuse ecchymosis around the left shoulder.  She is able to elevate the left arm to the level of the shoulder, without significant pain.  She is neurovascular intact distally in the left hand.  X-ray left shoulder, interpreted by me, no fracture or dislocation, there is a left AC joint separation approximately 50%.  Patient uses a walker to ambulate.  A sling would limit her, too much to use, and she has fair motion.  Patient's daughter and the patient declined a sling at this time.  They are instructed on care for a shoulder separation by me verbally.  They will follow up with her PCP, about all the problems, next week.   Daleen Bo, MD 08/06/19 [phone]
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
## 2019-08-06 NOTE — ED Notes (Signed)
Pace maker interrogated. [employer].

## 2019-08-06 NOTE — Discharge Instructions (Addendum)
Do not take the lasix/furosemide for the next 3 days. Call your doctor for re-evaluation of your medications, including furosemide.  Encourage her to drink lots of water and eat 3 meals each day.  Use ice on the sore spot of her head, 3-4 times a day for 3 days.  Make sure you assist her when she gets up to walk to prevent another fall.  Follow-up with her doctor for checkup next week to make sure she is doing better.  Return here, if needed.

## 2019-09-12 ENCOUNTER — Telehealth: Payer: Self-pay | Admitting: Family Medicine

## 2019-09-12 MED ORDER — SILVER SULFADIAZINE 1 % EX CREA: TOPICAL_CREAM | CUTANEOUS | 2 refills | Status: AC

## 2019-09-12 NOTE — Telephone Encounter (Signed)
Nurses please talk with family may send in refills of requested cream Please document reason why thank you

## 2019-09-12 NOTE — Telephone Encounter (Signed)
Patient has very thin skin and has skin tear and they were using neosporin but the ER Dr told them to use the silver topical crean and gave them a small amount and needs a prescription.

## 2019-09-12 NOTE — Telephone Encounter (Signed)
May have 50 g tube or what ever size it comes in relatively smaller apply thin amount twice daily as needed 3 refills

## 2019-09-12 NOTE — Telephone Encounter (Signed)
Pt's son in law calling requesting refill on Silver topical cream sent to [facility]

## 2019-09-12 NOTE — Telephone Encounter (Signed)
Prescription sent electronically to pharmacy. Patient and caregivers aware.

## 2019-09-26 DIAGNOSIS — N184 Chronic kidney disease, stage 4 (severe): Secondary | ICD-10-CM | POA: Diagnosis not present

## 2019-09-26 DIAGNOSIS — N2581 Secondary hyperparathyroidism of renal origin: Secondary | ICD-10-CM | POA: Diagnosis not present

## 2019-09-26 DIAGNOSIS — D631 Anemia in chronic kidney disease: Secondary | ICD-10-CM | POA: Diagnosis not present

## 2019-09-26 DIAGNOSIS — I129 Hypertensive chronic kidney disease with stage 1 through stage 4 chronic kidney disease, or unspecified chronic kidney disease: Secondary | ICD-10-CM | POA: Diagnosis not present

## 2019-11-01 ENCOUNTER — Ambulatory Visit (INDEPENDENT_AMBULATORY_CARE_PROVIDER_SITE_OTHER): Payer: Medicare Other | Admitting: *Deleted

## 2019-11-01 DIAGNOSIS — I459 Conduction disorder, unspecified: Secondary | ICD-10-CM | POA: Diagnosis not present

## 2019-11-02 ENCOUNTER — Telehealth: Payer: Self-pay

## 2019-11-02 LAB — CUP PACEART REMOTE DEVICE CHECK
Battery Impedance: 3235 Ohm
Battery Remaining Longevity: 13 mo
Battery Voltage: 2.7 V
Brady Statistic AP VP Percent: 96 %
Brady Statistic AP VS Percent: 3 %
Brady Statistic AS VP Percent: 1 %
Brady Statistic AS VS Percent: 0 %
Date Time Interrogation Session: 20210512130752
Implantable Lead Implant Date: 20050110
Implantable Lead Implant Date: 20050110
Implantable Lead Location: 753859
Implantable Lead Location: 753860
Implantable Lead Model: 5076
Implantable Lead Model: 5076
Implantable Pulse Generator Implant Date: 20120711
Lead Channel Impedance Value: 414 Ohm
Lead Channel Impedance Value: 476 Ohm
Lead Channel Pacing Threshold Amplitude: 0.5 V
Lead Channel Pacing Threshold Amplitude: 0.625 V
Lead Channel Pacing Threshold Pulse Width: 0.4 ms
Lead Channel Pacing Threshold Pulse Width: 0.4 ms
Lead Channel Setting Pacing Amplitude: 2 V
Lead Channel Setting Pacing Amplitude: 2.5 V
Lead Channel Setting Pacing Pulse Width: 0.4 ms
Lead Channel Setting Sensing Sensitivity: 4 mV

## 2019-11-02 NOTE — Telephone Encounter (Signed)
Spoke with patient to remind of missed remote transmission 

## 2019-11-03 NOTE — Progress Notes (Signed)
Remote pacemaker transmission.   

## 2020-01-06 ENCOUNTER — Other Ambulatory Visit: Payer: Self-pay | Admitting: Family Medicine

## 2020-01-09 NOTE — Telephone Encounter (Signed)
Scheduled 9/27

## 2020-01-31 ENCOUNTER — Ambulatory Visit (INDEPENDENT_AMBULATORY_CARE_PROVIDER_SITE_OTHER): Payer: Medicare Other | Admitting: *Deleted

## 2020-01-31 DIAGNOSIS — I459 Conduction disorder, unspecified: Secondary | ICD-10-CM | POA: Diagnosis not present

## 2020-02-01 LAB — CUP PACEART REMOTE DEVICE CHECK
Battery Impedance: 3525 Ohm
Battery Remaining Longevity: 11 mo
Battery Voltage: 2.7 V
Brady Statistic AP VP Percent: 95 %
Brady Statistic AP VS Percent: 4 %
Brady Statistic AS VP Percent: 1 %
Brady Statistic AS VS Percent: 0 %
Date Time Interrogation Session: 20210810213004
Implantable Lead Implant Date: 20050110
Implantable Lead Implant Date: 20050110
Implantable Lead Location: 753859
Implantable Lead Location: 753860
Implantable Lead Model: 5076
Implantable Lead Model: 5076
Implantable Pulse Generator Implant Date: 20120711
Lead Channel Impedance Value: 446 Ohm
Lead Channel Impedance Value: 486 Ohm
Lead Channel Pacing Threshold Amplitude: 0.5 V
Lead Channel Pacing Threshold Amplitude: 0.75 V
Lead Channel Pacing Threshold Pulse Width: 0.4 ms
Lead Channel Pacing Threshold Pulse Width: 0.4 ms
Lead Channel Setting Pacing Amplitude: 2 V
Lead Channel Setting Pacing Amplitude: 2.5 V
Lead Channel Setting Pacing Pulse Width: 0.4 ms
Lead Channel Setting Sensing Sensitivity: 5.6 mV

## 2020-02-02 NOTE — Progress Notes (Signed)
Remote pacemaker transmission.   

## 2020-02-10 ENCOUNTER — Inpatient Hospital Stay (HOSPITAL_COMMUNITY)
Admission: EM | Admit: 2020-02-10 | Discharge: 2020-02-14 | DRG: 291 | Disposition: A | Discharge status: Home Health | Payer: Medicare Other | Attending: Internal Medicine | Admitting: Internal Medicine

## 2020-02-10 ENCOUNTER — Emergency Department (HOSPITAL_COMMUNITY): Payer: Medicare Other

## 2020-02-10 ENCOUNTER — Other Ambulatory Visit: Payer: Self-pay

## 2020-02-10 ENCOUNTER — Encounter (HOSPITAL_COMMUNITY): Payer: Self-pay

## 2020-02-10 DIAGNOSIS — I509 Heart failure, unspecified: Secondary | ICD-10-CM

## 2020-02-10 DIAGNOSIS — Z66 Do not resuscitate: Secondary | ICD-10-CM | POA: Diagnosis not present

## 2020-02-10 DIAGNOSIS — G9341 Metabolic encephalopathy: Secondary | ICD-10-CM | POA: Diagnosis present

## 2020-02-10 DIAGNOSIS — Z8679 Personal history of other diseases of the circulatory system: Secondary | ICD-10-CM

## 2020-02-10 DIAGNOSIS — I5043 Acute on chronic combined systolic (congestive) and diastolic (congestive) heart failure: Secondary | ICD-10-CM | POA: Diagnosis present

## 2020-02-10 DIAGNOSIS — E876 Hypokalemia: Secondary | ICD-10-CM | POA: Diagnosis present

## 2020-02-10 DIAGNOSIS — Z7982 Long term (current) use of aspirin: Secondary | ICD-10-CM

## 2020-02-10 DIAGNOSIS — Z952 Presence of prosthetic heart valve: Secondary | ICD-10-CM

## 2020-02-10 DIAGNOSIS — R739 Hyperglycemia, unspecified: Secondary | ICD-10-CM | POA: Diagnosis present

## 2020-02-10 DIAGNOSIS — L8990 Pressure ulcer of unspecified site, unspecified stage: Secondary | ICD-10-CM | POA: Diagnosis present

## 2020-02-10 DIAGNOSIS — J9601 Acute respiratory failure with hypoxia: Secondary | ICD-10-CM | POA: Diagnosis not present

## 2020-02-10 DIAGNOSIS — Z853 Personal history of malignant neoplasm of breast: Secondary | ICD-10-CM

## 2020-02-10 DIAGNOSIS — T501X6A Underdosing of loop [high-ceiling] diuretics, initial encounter: Secondary | ICD-10-CM | POA: Diagnosis present

## 2020-02-10 DIAGNOSIS — Z87891 Personal history of nicotine dependence: Secondary | ICD-10-CM

## 2020-02-10 DIAGNOSIS — I130 Hypertensive heart and chronic kidney disease with heart failure and stage 1 through stage 4 chronic kidney disease, or unspecified chronic kidney disease: Principal | ICD-10-CM | POA: Diagnosis present

## 2020-02-10 DIAGNOSIS — Z20822 Contact with and (suspected) exposure to covid-19: Secondary | ICD-10-CM | POA: Diagnosis not present

## 2020-02-10 DIAGNOSIS — Z91048 Other nonmedicinal substance allergy status: Secondary | ICD-10-CM

## 2020-02-10 DIAGNOSIS — Z883 Allergy status to other anti-infective agents status: Secondary | ICD-10-CM

## 2020-02-10 DIAGNOSIS — E785 Hyperlipidemia, unspecified: Secondary | ICD-10-CM | POA: Diagnosis present

## 2020-02-10 DIAGNOSIS — Z85038 Personal history of other malignant neoplasm of large intestine: Secondary | ICD-10-CM

## 2020-02-10 DIAGNOSIS — R0689 Other abnormalities of breathing: Secondary | ICD-10-CM | POA: Diagnosis not present

## 2020-02-10 DIAGNOSIS — Z79899 Other long term (current) drug therapy: Secondary | ICD-10-CM

## 2020-02-10 DIAGNOSIS — Z90710 Acquired absence of both cervix and uterus: Secondary | ICD-10-CM

## 2020-02-10 DIAGNOSIS — I442 Atrioventricular block, complete: Secondary | ICD-10-CM | POA: Diagnosis present

## 2020-02-10 DIAGNOSIS — Z8249 Family history of ischemic heart disease and other diseases of the circulatory system: Secondary | ICD-10-CM

## 2020-02-10 DIAGNOSIS — D696 Thrombocytopenia, unspecified: Secondary | ICD-10-CM | POA: Diagnosis present

## 2020-02-10 DIAGNOSIS — E875 Hyperkalemia: Secondary | ICD-10-CM | POA: Diagnosis present

## 2020-02-10 DIAGNOSIS — Z79890 Hormone replacement therapy (postmenopausal): Secondary | ICD-10-CM

## 2020-02-10 DIAGNOSIS — L89152 Pressure ulcer of sacral region, stage 2: Secondary | ICD-10-CM | POA: Diagnosis present

## 2020-02-10 DIAGNOSIS — N184 Chronic kidney disease, stage 4 (severe): Secondary | ICD-10-CM | POA: Diagnosis present

## 2020-02-10 DIAGNOSIS — E039 Hypothyroidism, unspecified: Secondary | ICD-10-CM | POA: Diagnosis present

## 2020-02-10 DIAGNOSIS — Z950 Presence of cardiac pacemaker: Secondary | ICD-10-CM

## 2020-02-10 DIAGNOSIS — I248 Other forms of acute ischemic heart disease: Secondary | ICD-10-CM | POA: Diagnosis present

## 2020-02-10 DIAGNOSIS — I10 Essential (primary) hypertension: Secondary | ICD-10-CM | POA: Diagnosis present

## 2020-02-10 DIAGNOSIS — K219 Gastro-esophageal reflux disease without esophagitis: Secondary | ICD-10-CM | POA: Diagnosis present

## 2020-02-10 DIAGNOSIS — R0602 Shortness of breath: Secondary | ICD-10-CM | POA: Diagnosis not present

## 2020-02-10 DIAGNOSIS — M810 Age-related osteoporosis without current pathological fracture: Secondary | ICD-10-CM | POA: Diagnosis present

## 2020-02-10 DIAGNOSIS — I110 Hypertensive heart disease with heart failure: Secondary | ICD-10-CM | POA: Diagnosis not present

## 2020-02-10 DIAGNOSIS — R0902 Hypoxemia: Secondary | ICD-10-CM | POA: Diagnosis not present

## 2020-02-10 DIAGNOSIS — Z888 Allergy status to other drugs, medicaments and biological substances status: Secondary | ICD-10-CM

## 2020-02-10 DIAGNOSIS — I495 Sick sinus syndrome: Secondary | ICD-10-CM | POA: Diagnosis present

## 2020-02-10 DIAGNOSIS — I252 Old myocardial infarction: Secondary | ICD-10-CM

## 2020-02-10 DIAGNOSIS — Z951 Presence of aortocoronary bypass graft: Secondary | ICD-10-CM

## 2020-02-10 DIAGNOSIS — I2510 Atherosclerotic heart disease of native coronary artery without angina pectoris: Secondary | ICD-10-CM | POA: Diagnosis present

## 2020-02-10 DIAGNOSIS — I5020 Unspecified systolic (congestive) heart failure: Secondary | ICD-10-CM

## 2020-02-10 LAB — CBC WITH DIFFERENTIAL/PLATELET
Abs Immature Granulocytes: 0.19 10*3/uL — ABNORMAL HIGH (ref 0.00–0.07)
Basophils Absolute: 0 10*3/uL (ref 0.0–0.1)
Basophils Relative: 0 %
Eosinophils Absolute: 0 10*3/uL (ref 0.0–0.5)
Eosinophils Relative: 0 %
HCT: 43.4 % (ref 36.0–46.0)
Hemoglobin: 13.8 g/dL (ref 12.0–15.0)
Immature Granulocytes: 2 %
Lymphocytes Relative: 9 %
Lymphs Abs: 1 10*3/uL (ref 0.7–4.0)
MCH: 34.6 pg — ABNORMAL HIGH (ref 26.0–34.0)
MCHC: 31.8 g/dL (ref 30.0–36.0)
MCV: 108.8 fL — ABNORMAL HIGH (ref 80.0–100.0)
Monocytes Absolute: 0.8 10*3/uL (ref 0.1–1.0)
Monocytes Relative: 7 %
Neutro Abs: 8.8 10*3/uL — ABNORMAL HIGH (ref 1.7–7.7)
Neutrophils Relative %: 82 %
Platelets: 64 10*3/uL — ABNORMAL LOW (ref 150–400)
RBC: 3.99 MIL/uL (ref 3.87–5.11)
RDW: 20.5 % — ABNORMAL HIGH (ref 11.5–15.5)
WBC: 10.7 10*3/uL — ABNORMAL HIGH (ref 4.0–10.5)
nRBC: 7.4 % — ABNORMAL HIGH (ref 0.0–0.2)

## 2020-02-10 LAB — TROPONIN I (HIGH SENSITIVITY): Troponin I (High Sensitivity): 75 ng/L — ABNORMAL HIGH (ref <18)

## 2020-02-10 LAB — COMPREHENSIVE METABOLIC PANEL
ALT: 39 U/L (ref 0–44)
AST: 35 U/L (ref 15–41)
Albumin: 3.5 g/dL (ref 3.5–5.0)
Alkaline Phosphatase: 181 U/L — ABNORMAL HIGH (ref 38–126)
Anion gap: 11 (ref 5–15)
BUN: 58 mg/dL — ABNORMAL HIGH (ref 8–23)
CO2: 23 mmol/L (ref 22–32)
Calcium: 8.6 mg/dL — ABNORMAL LOW (ref 8.9–10.3)
Chloride: 107 mmol/L (ref 98–111)
Creatinine, Ser: 2.25 mg/dL — ABNORMAL HIGH (ref 0.44–1.00)
GFR calc Af Amer: 21 mL/min — ABNORMAL LOW (ref >60)
GFR calc non Af Amer: 18 mL/min — ABNORMAL LOW (ref >60)
Glucose, Bld: 148 mg/dL — ABNORMAL HIGH (ref 70–99)
Potassium: 5.7 mmol/L — ABNORMAL HIGH (ref 3.5–5.1)
Sodium: 141 mmol/L (ref 135–145)
Total Bilirubin: 2.2 mg/dL — ABNORMAL HIGH (ref 0.3–1.2)
Total Protein: 7 g/dL (ref 6.5–8.1)

## 2020-02-10 LAB — BRAIN NATRIURETIC PEPTIDE: B Natriuretic Peptide: >4500 pg/mL — ABNORMAL HIGH (ref 0.0–100.0)

## 2020-02-10 LAB — SARS CORONAVIRUS 2 BY RT PCR (HOSPITAL ORDER, PERFORMED IN ~~LOC~~ HOSPITAL LAB): SARS Coronavirus 2: NEGATIVE

## 2020-02-10 MED ORDER — FUROSEMIDE 10 MG/ML IJ SOLN
40.0000 mg | Freq: Once | INTRAMUSCULAR | Status: AC
  Administered 2020-02-11: 40 mg via INTRAVENOUS
  Filled 2020-02-10: qty 4
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
## 2020-02-10 NOTE — ED Triage Notes (Signed)
Pt to er room number 6 via ems, per ems pt is here for sob.  Pt oriented to place, person, doesn't know the day of the week.  Re oriented pt.  Pt c/o sob.  Pt states that she has been sob for a couple of days.

## 2020-02-11 ENCOUNTER — Inpatient Hospital Stay (HOSPITAL_COMMUNITY): Payer: Medicare Other

## 2020-02-11 DIAGNOSIS — E876 Hypokalemia: Secondary | ICD-10-CM | POA: Diagnosis present

## 2020-02-11 DIAGNOSIS — Z8249 Family history of ischemic heart disease and other diseases of the circulatory system: Secondary | ICD-10-CM | POA: Diagnosis not present

## 2020-02-11 DIAGNOSIS — R739 Hyperglycemia, unspecified: Secondary | ICD-10-CM | POA: Diagnosis present

## 2020-02-11 DIAGNOSIS — K219 Gastro-esophageal reflux disease without esophagitis: Secondary | ICD-10-CM | POA: Diagnosis present

## 2020-02-11 DIAGNOSIS — J9601 Acute respiratory failure with hypoxia: Secondary | ICD-10-CM | POA: Diagnosis present

## 2020-02-11 DIAGNOSIS — E038 Other specified hypothyroidism: Secondary | ICD-10-CM

## 2020-02-11 DIAGNOSIS — L89152 Pressure ulcer of sacral region, stage 2: Secondary | ICD-10-CM | POA: Diagnosis present

## 2020-02-11 DIAGNOSIS — I495 Sick sinus syndrome: Secondary | ICD-10-CM | POA: Diagnosis present

## 2020-02-11 DIAGNOSIS — Z85038 Personal history of other malignant neoplasm of large intestine: Secondary | ICD-10-CM | POA: Diagnosis not present

## 2020-02-11 DIAGNOSIS — G9341 Metabolic encephalopathy: Secondary | ICD-10-CM | POA: Diagnosis present

## 2020-02-11 DIAGNOSIS — I10 Essential (primary) hypertension: Secondary | ICD-10-CM | POA: Diagnosis not present

## 2020-02-11 DIAGNOSIS — E785 Hyperlipidemia, unspecified: Secondary | ICD-10-CM | POA: Diagnosis present

## 2020-02-11 DIAGNOSIS — Z950 Presence of cardiac pacemaker: Secondary | ICD-10-CM

## 2020-02-11 DIAGNOSIS — L8990 Pressure ulcer of unspecified site, unspecified stage: Secondary | ICD-10-CM | POA: Diagnosis present

## 2020-02-11 DIAGNOSIS — N184 Chronic kidney disease, stage 4 (severe): Secondary | ICD-10-CM | POA: Diagnosis present

## 2020-02-11 DIAGNOSIS — I130 Hypertensive heart and chronic kidney disease with heart failure and stage 1 through stage 4 chronic kidney disease, or unspecified chronic kidney disease: Secondary | ICD-10-CM | POA: Diagnosis present

## 2020-02-11 DIAGNOSIS — R9082 White matter disease, unspecified: Secondary | ICD-10-CM | POA: Diagnosis not present

## 2020-02-11 DIAGNOSIS — I252 Old myocardial infarction: Secondary | ICD-10-CM | POA: Diagnosis not present

## 2020-02-11 DIAGNOSIS — M810 Age-related osteoporosis without current pathological fracture: Secondary | ICD-10-CM | POA: Diagnosis present

## 2020-02-11 DIAGNOSIS — E875 Hyperkalemia: Secondary | ICD-10-CM | POA: Diagnosis present

## 2020-02-11 DIAGNOSIS — I509 Heart failure, unspecified: Secondary | ICD-10-CM

## 2020-02-11 DIAGNOSIS — I442 Atrioventricular block, complete: Secondary | ICD-10-CM | POA: Diagnosis present

## 2020-02-11 DIAGNOSIS — Z8679 Personal history of other diseases of the circulatory system: Secondary | ICD-10-CM

## 2020-02-11 DIAGNOSIS — Z66 Do not resuscitate: Secondary | ICD-10-CM | POA: Diagnosis present

## 2020-02-11 DIAGNOSIS — I5021 Acute systolic (congestive) heart failure: Secondary | ICD-10-CM | POA: Diagnosis not present

## 2020-02-11 DIAGNOSIS — D696 Thrombocytopenia, unspecified: Secondary | ICD-10-CM

## 2020-02-11 DIAGNOSIS — G9340 Encephalopathy, unspecified: Secondary | ICD-10-CM | POA: Diagnosis not present

## 2020-02-11 DIAGNOSIS — I739 Peripheral vascular disease, unspecified: Secondary | ICD-10-CM | POA: Diagnosis not present

## 2020-02-11 DIAGNOSIS — I5043 Acute on chronic combined systolic (congestive) and diastolic (congestive) heart failure: Secondary | ICD-10-CM | POA: Diagnosis present

## 2020-02-11 DIAGNOSIS — E039 Hypothyroidism, unspecified: Secondary | ICD-10-CM | POA: Diagnosis present

## 2020-02-11 DIAGNOSIS — I248 Other forms of acute ischemic heart disease: Secondary | ICD-10-CM | POA: Diagnosis present

## 2020-02-11 DIAGNOSIS — Z853 Personal history of malignant neoplasm of breast: Secondary | ICD-10-CM | POA: Diagnosis not present

## 2020-02-11 DIAGNOSIS — Z20822 Contact with and (suspected) exposure to covid-19: Secondary | ICD-10-CM | POA: Diagnosis present

## 2020-02-11 DIAGNOSIS — I2510 Atherosclerotic heart disease of native coronary artery without angina pectoris: Secondary | ICD-10-CM | POA: Diagnosis present

## 2020-02-11 DIAGNOSIS — E7849 Other hyperlipidemia: Secondary | ICD-10-CM | POA: Diagnosis not present

## 2020-02-11 LAB — CBC WITH DIFFERENTIAL/PLATELET
Abs Immature Granulocytes: 0.16 10*3/uL — ABNORMAL HIGH (ref 0.00–0.07)
Basophils Absolute: 0 10*3/uL (ref 0.0–0.1)
Basophils Relative: 0 %
Eosinophils Absolute: 0.2 10*3/uL (ref 0.0–0.5)
Eosinophils Relative: 2 %
HCT: 41.9 % (ref 36.0–46.0)
Hemoglobin: 13 g/dL (ref 12.0–15.0)
Immature Granulocytes: 2 %
Lymphocytes Relative: 14 %
Lymphs Abs: 1.3 10*3/uL (ref 0.7–4.0)
MCH: 33.6 pg (ref 26.0–34.0)
MCHC: 31 g/dL (ref 30.0–36.0)
MCV: 108.3 fL — ABNORMAL HIGH (ref 80.0–100.0)
Monocytes Absolute: 0.7 10*3/uL (ref 0.1–1.0)
Monocytes Relative: 7 %
Neutro Abs: 7.1 10*3/uL (ref 1.7–7.7)
Neutrophils Relative %: 75 %
Platelets: 57 10*3/uL — ABNORMAL LOW (ref 150–400)
RBC: 3.87 MIL/uL (ref 3.87–5.11)
RDW: 20 % — ABNORMAL HIGH (ref 11.5–15.5)
WBC: 9.4 10*3/uL (ref 4.0–10.5)
nRBC: 3.1 % — ABNORMAL HIGH (ref 0.0–0.2)

## 2020-02-11 LAB — ECHOCARDIOGRAM COMPLETE
AR max vel: 0.45 cm2
AV Area VTI: 0.39 cm2
AV Area mean vel: 0.43 cm2
AV Mean grad: 36.7 mmHg
AV Peak grad: 62.7 mmHg
Ao pk vel: 3.96 m/s
Area-P 1/2: 4.41 cm2
Height: 65 in
MV M vel: 6.5 m/s
MV Peak grad: 169 mmHg
P 1/2 time: 295 msec
S' Lateral: 3.06 cm
Weight: 1908.3 oz

## 2020-02-11 LAB — TROPONIN I (HIGH SENSITIVITY): Troponin I (High Sensitivity): 65 ng/L — ABNORMAL HIGH (ref <18)

## 2020-02-11 LAB — URINALYSIS, ROUTINE W REFLEX MICROSCOPIC
Bilirubin Urine: NEGATIVE
Glucose, UA: NEGATIVE mg/dL
Hgb urine dipstick: NEGATIVE
Ketones, ur: NEGATIVE mg/dL
Nitrite: NEGATIVE
Protein, ur: 100 mg/dL — AB
Specific Gravity, Urine: 1.013 (ref 1.005–1.030)
pH: 5 (ref 5.0–8.0)

## 2020-02-11 LAB — GLUCOSE, CAPILLARY
Glucose-Capillary: 93 mg/dL (ref 70–99)
Glucose-Capillary: 94 mg/dL (ref 70–99)

## 2020-02-11 LAB — COMPREHENSIVE METABOLIC PANEL
ALT: 32 U/L (ref 0–44)
AST: 27 U/L (ref 15–41)
Albumin: 3 g/dL — ABNORMAL LOW (ref 3.5–5.0)
Alkaline Phosphatase: 168 U/L — ABNORMAL HIGH (ref 38–126)
Anion gap: 8 (ref 5–15)
BUN: 60 mg/dL — ABNORMAL HIGH (ref 8–23)
CO2: 24 mmol/L (ref 22–32)
Calcium: 8.4 mg/dL — ABNORMAL LOW (ref 8.9–10.3)
Chloride: 110 mmol/L (ref 98–111)
Creatinine, Ser: 2.23 mg/dL — ABNORMAL HIGH (ref 0.44–1.00)
GFR calc Af Amer: 21 mL/min — ABNORMAL LOW (ref >60)
GFR calc non Af Amer: 18 mL/min — ABNORMAL LOW (ref >60)
Glucose, Bld: 105 mg/dL — ABNORMAL HIGH (ref 70–99)
Potassium: 5.1 mmol/L (ref 3.5–5.1)
Sodium: 142 mmol/L (ref 135–145)
Total Bilirubin: 1.7 mg/dL — ABNORMAL HIGH (ref 0.3–1.2)
Total Protein: 6 g/dL — ABNORMAL LOW (ref 6.5–8.1)

## 2020-02-11 LAB — MAGNESIUM: Magnesium: 2.4 mg/dL (ref 1.7–2.4)

## 2020-02-11 LAB — POTASSIUM
Potassium: 4.7 mmol/L (ref 3.5–5.1)
Potassium: 5.2 mmol/L — ABNORMAL HIGH (ref 3.5–5.1)

## 2020-02-11 LAB — TSH: TSH: 1.992 u[IU]/mL (ref 0.350–4.500)

## 2020-02-11 MED ORDER — ORAL CARE MOUTH RINSE
15.0000 mL | Freq: Two times a day (BID) | OROMUCOSAL | Status: DC
  Administered 2020-02-11 – 2020-02-13 (×3): 15 mL via OROMUCOSAL

## 2020-02-11 MED ORDER — SODIUM CHLORIDE 0.9 % IV SOLN: 250.0000 mL | INTRAVENOUS | Status: DC | PRN

## 2020-02-11 MED ORDER — ACETAMINOPHEN 325 MG PO TABS: 650.0000 mg | ORAL_TABLET | ORAL | Status: DC | PRN

## 2020-02-11 MED ORDER — ASPIRIN EC 81 MG PO TBEC
81.0000 mg | DELAYED_RELEASE_TABLET | Freq: Every day | ORAL | Status: DC
  Administered 2020-02-11 – 2020-02-14 (×4): 81 mg via ORAL
  Filled 2020-02-11 (×4): qty 1

## 2020-02-11 MED ORDER — SODIUM CHLORIDE 0.9% FLUSH: 3.0000 mL | INTRAVENOUS | Status: DC | PRN

## 2020-02-11 MED ORDER — SODIUM CHLORIDE 0.9% FLUSH
3.0000 mL | Freq: Two times a day (BID) | INTRAVENOUS | Status: DC
  Administered 2020-02-11 – 2020-02-13 (×5): 3 mL via INTRAVENOUS

## 2020-02-11 MED ORDER — FUROSEMIDE 10 MG/ML IJ SOLN
40.0000 mg | Freq: Every day | INTRAMUSCULAR | Status: DC
  Administered 2020-02-11 – 2020-02-12 (×2): 40 mg via INTRAVENOUS
  Filled 2020-02-11 (×2): qty 4

## 2020-02-11 MED ORDER — METOPROLOL SUCCINATE ER 25 MG PO TB24
25.0000 mg | ORAL_TABLET | Freq: Two times a day (BID) | ORAL | Status: DC
  Administered 2020-02-11 – 2020-02-14 (×7): 25 mg via ORAL
  Filled 2020-02-11 (×7): qty 1

## 2020-02-11 MED ORDER — FUROSEMIDE 10 MG/ML IJ SOLN
20.0000 mg | Freq: Once | INTRAMUSCULAR | Status: AC
  Administered 2020-02-11: 20 mg via INTRAVENOUS
  Filled 2020-02-11: qty 2

## 2020-02-11 MED ORDER — ONDANSETRON HCL 4 MG/2ML IJ SOLN: 4.0000 mg | Freq: Four times a day (QID) | INTRAMUSCULAR | Status: DC | PRN

## 2020-02-11 MED ORDER — CALCITRIOL 0.25 MCG PO CAPS
0.2500 ug | ORAL_CAPSULE | Freq: Every day | ORAL | Status: DC
  Administered 2020-02-11 – 2020-02-14 (×4): 0.25 ug via ORAL
  Filled 2020-02-11 (×4): qty 1

## 2020-02-11 MED ORDER — LISINOPRIL 5 MG PO TABS
2.5000 mg | ORAL_TABLET | Freq: Every day | ORAL | Status: DC
  Administered 2020-02-11: 2.5 mg via ORAL
  Filled 2020-02-11: qty 1

## 2020-02-11 MED ORDER — LEVOTHYROXINE SODIUM 88 MCG PO TABS
88.0000 ug | ORAL_TABLET | Freq: Every day | ORAL | Status: DC
  Administered 2020-02-12 – 2020-02-14 (×3): 88 ug via ORAL
  Filled 2020-02-11 (×4): qty 1

## 2020-02-11 MED ORDER — HEPARIN SODIUM (PORCINE) 5000 UNIT/ML IJ SOLN
5000.0000 [IU] | Freq: Three times a day (TID) | INTRAMUSCULAR | Status: DC
  Administered 2020-02-11: 5000 [IU] via SUBCUTANEOUS
  Filled 2020-02-11: qty 1

## 2020-02-11 NOTE — Progress Notes (Signed)
Attempted turning patient throughout day and patient reverts back to lying on her right side. Propped pillows on that side to try to offload, but patient continues to move back to lying curled on right side. Family reports that is how she lies at home too, and that she prefers it exclusively. Will continue to attempt to turn patient to offload pressure.

## 2020-02-11 NOTE — Progress Notes (Addendum)
PROGRESS NOTE    CONLEY PAWLING  VWP:[id number] DOB: Jan 08, 1922 DOA: 02/10/2020 PCP: Kathyrn Drown, MD    Brief Narrative:  Alexandra Hall  is a 86 y.o. female, with history of sinus node dysfunction, renal insufficiency, renal artery stenosis, osteoporosis, myocardial infarction, hypothyroidism, hypertension, hyperlipidemia, GERD, coronary artery disease, and more presents to the ED with a chief complaint of shortness of breath.  Patient opens eyes to voice but is nonverbal at this time.  History is taken from daughter.  She reports that initially patient was brought into the ER for labored breathing.  It started 2 days ago.  Patient does not wear oxygen at baseline.  He noticed increased difficulty breathing throughout the day.  They called PCP who advised to take patient in to be seen.  At baseline patient cannot get up on her own, into a car, onto an exam table, etc. so they decided to bring her into the ED versus urgent care.  At baseline patient is coherent she knows what year it is she recognizes people around her.  They report that her mental status has not been at baseline in last 48 hours.  She has been very quiet, fatigued and wanting to sleep all the time.  She has not been drinking or eating.  And she has not been coherent when she does speak.  No further history can be obtained.  ED course Temperature 97.4, heart rate 75, respiratory rate 24, blood pressure 157/72 satting at 95% on 2 L nasal cannula No leukocytosis with a white blood cell count of 10.7 CHEM panel shows hyperkalemia at 5.7, baseline creatinine at 2.25, hyperglycemia 148 BNP is greater than 4500 Troponins downtrending from 75-65 Covid is negative Chest x-ray shows small lung volumes right pleural effusion atelectasis versus infiltrate within the left midlung zone Lasix 40 mg given one-time IV in the ED Last echo was August 2013 -results cannot be seen in record   Assessment & Plan:   Active Problems:    Hypothyroidism   Hyperlipidemia   CKD (chronic kidney disease), stage IV (HCC)   HTN (hypertension)   PACEMAKER, PERMANENT   CHF (congestive heart failure) (HCC)   Thrombocytopenia (HCC)   Pressure injury of skin   Acute on chronic combined systolic and diastolic CHF (congestive heart failure) ([place])   Acute respiratory failure with hypoxia (HCC)   History of complete heart block   1. Acute respiratory failure with hypoxia.  Secondary to decompensated CHF.  Continue to wean off oxygen as tolerated. 2. Acute combined CHF.  Ejection fraction 30 to 35% with grade 2 diastolic dysfunction on echocardiogram.  On previous echo from 2013, EF was noted to be 55%.  There was motion of significant wall motion abnormalities on echocardiogram.  Unclear if these are acute changes.  Troponins have been unremarkable.  Discussed further work-up with patient's family who do not wish to pursue invasive cardiac work-up including cardiac catheterization.  They are agreeable for medical management which seem to be reasonable.  The patient is already on beta-blockers.  Would avoid ACE inhibitors in light of renal dysfunction.  Continue diuresis with IV Lasix. 3. Chronic kidney disease stage IV.  Creatinine is currently at baseline.  We will continue to monitor in the setting of diuresis. 4. Thrombocytopenia.  Unclear etiology.  She does not appear septic or toxic.  TSH noted to be normal.  We will also check B12.  No signs of bleeding at this time.  Continue to monitor. 5. History  of complete heart block status post pacemaker 6. Hypertension.  Blood pressure currently stable. 7. Hypothyroidism.  Continue Synthroid 8. Hyperlipidemia.  We will continue statin.  Pressure Injury 02/11/20 Sacrum Mid Stage 2 -  Partial thickness loss of dermis presenting as a shallow open injury with a red, pink wound bed without slough. (Active)  02/11/20 0215  Location: Sacrum  Location Orientation: Mid  Staging: Stage 2 -  Partial  thickness loss of dermis presenting as a shallow open injury with a red, pink wound bed without slough.  Wound Description (Comments):   Present on Admission: Yes   -Continue supportive care.      DVT prophylaxis: SCDs Start: 02/11/20 0209  Code Status: DNR Family Communication: Updated daughter and son-in-law over the phone Disposition Plan: Status is: Inpatient  Remains inpatient appropriate because:IV treatments appropriate due to intensity of illness or inability to take PO   Dispo: The patient is from: Home              Anticipated d/c is to: Home              Anticipated d/c date is: 2 days              Patient currently is not medically stable to d/c.  Patient needs continued diuresis for volume overload         Consultants:     Procedures:   Echo:1. Left ventricular ejection fraction, by estimation, is 30 to 35%. The  left ventricle has moderately decreased function. The left ventricle  demonstrates regional wall motion abnormalities (see scoring  diagram/findings for description). There is mild  left ventricular hypertrophy. Left ventricular diastolic parameters are  consistent with Grade II diastolic dysfunction (pseudonormalization).  There is akinesis of the left ventricular, anterior wall, anteroseptal  wall and apical segment.  2. There is a filamentous. mobile vegetation on the RV lead. . Right  ventricular systolic function is moderately reduced. The right ventricular  size is moderately enlarged. There is severely elevated pulmonary artery  systolic pressure.  3. Left atrial size was severely dilated.  4. Right atrial size was moderately dilated.  5. The mitral valve is abnormal. Moderate mitral valve regurgitation.  Mild mitral stenosis.  6. The tricuspid valve is abnormal. The tricuspid valve is status post  repair with an annuloplasty ring. Tricuspid valve regurgitation is severe.  7. The aortic valve has been repaired/replaced. Aortic  valve  regurgitation is mild to moderate. Moderate to severe aortic valve  stenosis. There is a bioprosthetic valve present in the aortic position.  8. The inferior vena cava is normal in size with greater than 50%  respiratory variability, suggesting right atrial pressure of 3 mmHg.   Antimicrobials:      Subjective: Feels that breathing is unchanged from yesterday.  No chest pain.  Objective: Vitals:   02/11/20 0210 02/11/20 0703 02/11/20 1347 02/11/20 1619  BP: (!) 156/82 (!) 114/58 (!) 145/69   Pulse: 76 74 78   Resp: 18 15 20    Temp: 97.7 F (36.5 C) 97.7 F (36.5 C) 98.9 F (37.2 C)   TempSrc: Oral Oral    SpO2: 99% 100% 94% 100%  Weight: 54.1 kg     Height:        Intake/Output Summary (Last 24 hours) at 02/11/2020 1905 Last data filed at 02/11/2020 1822 Gross per 24 hour  Intake 360 ml  Output 1900 ml  Net -1540 ml   Filed Weights   02/10/20 1939 02/11/20  0210  Weight: 54.4 kg 54.1 kg    Examination:  General exam: Appears calm and comfortable  Respiratory system: Crackles at bases. Respiratory effort normal. Cardiovascular system: S1 & S2 heard, RRR. No JVD, murmurs, rubs, gallops or clicks. No pedal edema. Gastrointestinal system: Abdomen is nondistended, soft and nontender. No organomegaly or masses felt. Normal bowel sounds heard. Central nervous system: Alert and oriented. No focal neurological deficits. Extremities: 1+ edema bilaterally Skin: No rashes, lesions or ulcers Psychiatry: Judgement and insight appear normal. Mood & affect appropriate.     Data Reviewed: I have personally reviewed following labs and imaging studies  CBC: Recent Labs  Lab 02/10/20 1948 02/11/20 0611  WBC 10.7* 9.4  NEUTROABS 8.8* 7.1  HGB 13.8 13.0  HCT 43.4 41.9  MCV 108.8* 108.3*  PLT 64* 57*   Basic Metabolic Panel: Recent Labs  Lab 02/10/20 1948 02/10/20 2345 02/11/20 0611 02/11/20 1146  NA 141  --  142  --   K 5.7* 5.2* 5.1 4.7  CL 107  --  110   --   CO2 23  --  24  --   GLUCOSE 148*  --  105*  --   BUN 58*  --  60*  --   CREATININE 2.25*  --  2.23*  --   CALCIUM 8.6*  --  8.4*  --   MG  --   --  2.4  --    GFR: Estimated Creatinine Clearance: 12.6 mL/min (A) (by C-G formula based on SCr of 2.23 mg/dL (H)). Liver Function Tests: Recent Labs  Lab 02/10/20 1948 02/11/20 0611  AST 35 27  ALT 39 32  ALKPHOS 181* 168*  BILITOT 2.2* 1.7*  PROT 7.0 6.0*  ALBUMIN 3.5 3.0*   No results for input(s): LIPASE, AMYLASE in the last 168 hours. No results for input(s): AMMONIA in the last 168 hours. Coagulation Profile: No results for input(s): INR, PROTIME in the last 168 hours. Cardiac Enzymes: No results for input(s): CKTOTAL, CKMB, CKMBINDEX, TROPONINI in the last 168 hours. BNP (last 3 results) No results for input(s): PROBNP in the last 8760 hours. HbA1C: No results for input(s): HGBA1C in the last 72 hours. CBG: Recent Labs  Lab 02/11/20 1636  GLUCAP 93   Lipid Profile: No results for input(s): CHOL, HDL, LDLCALC, TRIG, CHOLHDL, LDLDIRECT in the last 72 hours. Thyroid Function Tests: Recent Labs    02/11/20 0611  TSH 1.992   Anemia Panel: No results for input(s): VITAMINB12, FOLATE, FERRITIN, TIBC, IRON, RETICCTPCT in the last 72 hours. Sepsis Labs: No results for input(s): PROCALCITON, LATICACIDVEN in the last 168 hours.  Recent Results (from the past 240 hour(s))  SARS Coronavirus 2 by RT PCR (hospital order, performed in [facility] hospital lab) Nasopharyngeal Nasopharyngeal Swab     Status: None   Collection Time: 02/10/20  7:53 PM   Specimen: Nasopharyngeal Swab  Result Value Ref Range Status   SARS Coronavirus 2 NEGATIVE NEGATIVE Final    Comment: (NOTE) SARS-CoV-2 target nucleic acids are NOT DETECTED.  The SARS-CoV-2 RNA is generally detectable in upper and lower respiratory specimens during the acute phase of infection. The lowest concentration of SARS-CoV-2 viral copies this assay can detect is  250 copies / mL. A negative result does not preclude SARS-CoV-2 infection and should not be used as the sole basis for treatment or other patient management decisions.  A negative result may occur with improper specimen collection / handling, submission of specimen other than nasopharyngeal swab,  presence of viral mutation(s) within the areas targeted by this assay, and inadequate number of viral copies (<250 copies / mL). A negative result must be combined with clinical observations, patient history, and epidemiological information.  Fact Sheet for Patients:   StrictlyIdeas.no  Fact Sheet for Healthcare Providers: BankingDealers.co.za  This test is not yet approved or  cleared by the [place] FDA and has been authorized for detection and/or diagnosis of SARS-CoV-2 by FDA under an Emergency Use Authorization (EUA).  This EUA will remain in effect (meaning this test can be used) for the duration of the COVID-19 declaration under Section 564(b)(1) of the Act, 21 U.S.C. section 360bbb-3(b)(1), unless the authorization is terminated or revoked sooner.  Performed at [facility], [address], [place], [place] [address]          Radiology Studies: CT HEAD WO CONTRAST  Result Date: 02/11/2020 CLINICAL DATA:  Encephalopathy EXAM: CT HEAD WITHOUT CONTRAST TECHNIQUE: Contiguous axial images were obtained from the base of the skull through the vertex without intravenous contrast. COMPARISON:  None. FINDINGS: Brain: There is no mass, hemorrhage or extra-axial collection. The size and configuration of the ventricles and extra-axial CSF spaces are normal. There is hypoattenuation of the white matter, most commonly indicating chronic small vessel disease. Vascular: No abnormal hyperdensity of the major intracranial arteries or dural venous sinuses. No intracranial atherosclerosis. Skull: The visualized skull base, calvarium and extracranial  soft tissues are normal. Sinuses/Orbits: No fluid levels or advanced mucosal thickening of the visualized paranasal sinuses. No mastoid or middle ear effusion. The orbits are normal. Other: There is an inferior left parotid mass is incompletely visualized but measures at least 2.5 cm. This is increased in size compared to 08/14/2005. IMPRESSION: 1. No acute intracranial abnormality. 2. Chronic small vessel disease. 3. Incompletely visualized inferior left parotid mass, measuring at least 2.5 cm. This is increased in size compared to 08/14/2005. Electronically Signed   By: Ulyses Jarred M.D.   On: 02/11/2020 02:06   DG Chest Port 1 View  Result Date: 02/10/2020 CLINICAL DATA:  Dyspnea EXAM: PORTABLE CHEST 1 VIEW COMPARISON:  12/23/2010 FINDINGS: Lung volumes are extremely small with resultant vascular crowding at the hila. Small right pleural effusion is present. Atelectasis or infiltrate is seen within the left mid lung zone, partially obscured by overlying pacemaker. No pneumothorax. Left subclavian dual lead pacemaker is unchanged. Aortic and tricuspid valve replacement has been performed. Cardiac size is mildly enlarged, unchanged. IMPRESSION: 1. Extremely small lung volumes. 2. Small right pleural effusion. 3. Atelectasis or infiltrate within the left mid lung zone. Electronically Signed   By: Fidela Salisbury MD   On: 02/10/2020 23:15   ECHOCARDIOGRAM COMPLETE  Result Date: 02/11/2020    ECHOCARDIOGRAM REPORT   Patient Name:   Paizlie A Sooy Date of Exam: 02/11/2020 Medical Rec #:  [id number]      Height:       65.0 in Accession #:    [id number]     Weight:       119.3 lb Date of Birth:  11-Jul-1921       BSA:          1.588 m Patient Age:    71 years       BP:           114/58 mmHg Patient Gender: F              HR:           74 bpm. Exam  Location:  [facility] Procedure: 2D Echo Indications:    CHF-Acute Systolic 622.63 / F35.45  History:        Patient has no prior history of Echocardiogram examinations.                  CHF, Signs/Symptoms:Dyspnea; Risk Factors:Hypertension and                 Dyslipidemia. COLON CANCER, BREAST CANCER, Bradycardia, Chronic                 renal disease, TRICUSPID VALVE DISORDER, Sinus node dysfunction.                 Aortic Valve: bioprosthetic valve is present in the aortic                 position.  Sonographer:    Leavy Cella RDCS (AE) Referring Phys: [id number] ASIA B [place]  1. Left ventricular ejection fraction, by estimation, is 30 to 35%. The left ventricle has moderately decreased function. The left ventricle demonstrates regional wall motion abnormalities (see scoring diagram/findings for description). There is mild left ventricular hypertrophy. Left ventricular diastolic parameters are consistent with Grade II diastolic dysfunction (pseudonormalization). There is akinesis of the left ventricular, anterior wall, anteroseptal wall and apical segment.  2. There is a filamentous. mobile vegetation on the RV lead. . Right ventricular systolic function is moderately reduced. The right ventricular size is moderately enlarged. There is severely elevated pulmonary artery systolic pressure.  3. Left atrial size was severely dilated.  4. Right atrial size was moderately dilated.  5. The mitral valve is abnormal. Moderate mitral valve regurgitation. Mild mitral stenosis.  6. The tricuspid valve is abnormal. The tricuspid valve is status post repair with an annuloplasty ring. Tricuspid valve regurgitation is severe.  7. The aortic valve has been repaired/replaced. Aortic valve regurgitation is mild to moderate. Moderate to severe aortic valve stenosis. There is a bioprosthetic valve present in the aortic position.  8. The inferior vena cava is normal in size with greater than 50% respiratory variability, suggesting right atrial pressure of 3 mmHg. FINDINGS  Left Ventricle: Left ventricular ejection fraction, by estimation, is 30 to 35%. The left ventricle has  moderately decreased function. The left ventricle demonstrates regional wall motion abnormalities. The left ventricular internal cavity size was normal in size. There is mild left ventricular hypertrophy. Left ventricular diastolic parameters are consistent with Grade II diastolic dysfunction (pseudonormalization). Right Ventricle: There is a filamentous. mobile vegetation on the RV lead. The right ventricular size is moderately enlarged. No increase in right ventricular wall thickness. Right ventricular systolic function is moderately reduced. There is severely elevated pulmonary artery systolic pressure. The tricuspid regurgitant velocity is 4.04 m/s, and with an assumed right atrial pressure of 10 mmHg, the estimated right ventricular systolic pressure is 37.3 mmHg. Left Atrium: Left atrial size was severely dilated. Right Atrium: Right atrial size was moderately dilated. Pericardium: There is no evidence of pericardial effusion. Mitral Valve: The mitral valve is abnormal. There is moderate thickening of the mitral valve leaflet(s). Moderately decreased mobility of the mitral valve leaflets. Severe mitral annular calcification. Moderate mitral valve regurgitation. Mild mitral valve stenosis. MV peak gradient, 6.6 mmHg. The mean mitral valve gradient is 3.0 mmHg. Tricuspid Valve: The tricuspid valve is abnormal. Tricuspid valve regurgitation is severe. The tricuspid valve is status post repair with an annuloplasty ring. Aortic Valve: The aortic valve has been repaired/replaced. Aortic valve regurgitation is mild  to moderate. Aortic regurgitation PHT measures 295 msec. Moderate to severe aortic stenosis is present. Aortic valve mean gradient measures 36.7 mmHg. Aortic valve peak gradient measures 62.7 mmHg. Aortic valve area, by VTI measures 0.39 cm. There is a bioprosthetic valve present in the aortic position. Pulmonic Valve: The pulmonic valve was not well visualized. Pulmonic valve regurgitation is trivial.  Aorta: The aortic root and ascending aorta are structurally normal, with no evidence of dilitation. Venous: The inferior vena cava is normal in size with greater than 50% respiratory variability, suggesting right atrial pressure of 3 mmHg. IAS/Shunts: No atrial level shunt detected by color flow Doppler. Additional Comments: A pacer wire is visualized.  LEFT VENTRICLE PLAX 2D LVIDd:         3.64 cm  Diastology LVIDs:         3.06 cm  LV e' lateral:   4.65 cm/s LV PW:         1.30 cm  LV E/e' lateral: 28.8 LV IVS:        1.25 cm LVOT diam:     2.00 cm LV SV:         39 LV SV Index:   25 LVOT Area:     3.14 cm  RIGHT VENTRICLE RV S prime:     7.05 cm/s TAPSE (M-mode): 1.2 cm LEFT ATRIUM             Index       RIGHT ATRIUM           Index LA diam:        4.40 cm 2.77 cm/m  RA Area:     18.70 cm LA Vol (A2C):   79.9 ml 50.32 ml/m RA Volume:   52.60 ml  33.12 ml/m LA Vol (A4C):   69.5 ml 43.77 ml/m LA Biplane Vol: 79.8 ml 50.25 ml/m  AORTIC VALVE AV Area (Vmax):    0.45 cm AV Area (Vmean):   0.43 cm AV Area (VTI):     0.39 cm AV Vmax:           396.00 cm/s AV Vmean:          285.000 cm/s AV VTI:            0.994 m AV Peak Grad:      62.7 mmHg AV Mean Grad:      36.7 mmHg LVOT Vmax:         56.10 cm/s LVOT Vmean:        38.933 cm/s LVOT VTI:          0.125 m LVOT/AV VTI ratio: 0.13 AI PHT:            295 msec  AORTA Ao Root diam: 2.60 cm MITRAL VALVE                TRICUSPID VALVE MV Area (PHT): 4.41 cm     TR Peak grad:   65.3 mmHg MV Peak grad:  6.6 mmHg     TR Vmax:        404.00 cm/s MV Mean grad:  3.0 mmHg MV Vmax:       1.28 m/s     SHUNTS MV Vmean:      85.3 cm/s    Systemic VTI:  0.12 m MV Decel Time: 172 msec     Systemic Diam: 2.00 cm MR Peak grad: 169.0 mmHg MR Mean grad: 101.0 mmHg MR Vmax:      650.00 cm/s MR Vmean:  460.0 cm/s MV E velocity: 134.00 cm/s MV A velocity: 108.00 cm/s MV E/A ratio:  1.24 Glori Bickers MD Electronically signed by Glori Bickers MD Signature Date/Time:  02/11/2020/3:05:47 PM    Final         Scheduled Meds: . aspirin EC  81 mg Oral Daily  . calcitRIOL  0.25 mcg Oral Daily  . furosemide  20 mg Intravenous Once  . furosemide  40 mg Intravenous Daily  . levothyroxine  88 mcg Oral Q0600  . mouth rinse  15 mL Mouth Rinse BID  . metoprolol succinate  25 mg Oral BID  . sodium chloride flush  3 mL Intravenous Q12H   Continuous Infusions: . sodium chloride       LOS: 0 days    Time spent: 75mins    Kathie Dike, MD [facility]   If 7PM-7AM, please contact night-coverage [email or web address]  02/11/2020, 7:05 PM

## 2020-02-11 NOTE — Progress Notes (Signed)
*  PRELIMINARY RESULTS* Echocardiogram 2D Echocardiogram has been performed.  Alexandra Hall 02/11/2020, 11:02 AM
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
## 2020-02-12 DIAGNOSIS — E7849 Other hyperlipidemia: Secondary | ICD-10-CM

## 2020-02-12 LAB — COMPREHENSIVE METABOLIC PANEL
ALT: 31 U/L (ref 0–44)
AST: 30 U/L (ref 15–41)
Albumin: 3 g/dL — ABNORMAL LOW (ref 3.5–5.0)
Alkaline Phosphatase: 161 U/L — ABNORMAL HIGH (ref 38–126)
Anion gap: 12 (ref 5–15)
BUN: 60 mg/dL — ABNORMAL HIGH (ref 8–23)
CO2: 23 mmol/L (ref 22–32)
Calcium: 8.5 mg/dL — ABNORMAL LOW (ref 8.9–10.3)
Chloride: 104 mmol/L (ref 98–111)
Creatinine, Ser: 1.98 mg/dL — ABNORMAL HIGH (ref 0.44–1.00)
GFR calc Af Amer: 24 mL/min — ABNORMAL LOW (ref >60)
GFR calc non Af Amer: 21 mL/min — ABNORMAL LOW (ref >60)
Glucose, Bld: 85 mg/dL (ref 70–99)
Potassium: 3.8 mmol/L (ref 3.5–5.1)
Sodium: 139 mmol/L (ref 135–145)
Total Bilirubin: 2.2 mg/dL — ABNORMAL HIGH (ref 0.3–1.2)
Total Protein: 6.2 g/dL — ABNORMAL LOW (ref 6.5–8.1)

## 2020-02-12 LAB — CBC
HCT: 45 % (ref 36.0–46.0)
Hemoglobin: 13.7 g/dL (ref 12.0–15.0)
MCH: 33.5 pg (ref 26.0–34.0)
MCHC: 30.4 g/dL (ref 30.0–36.0)
MCV: 110 fL — ABNORMAL HIGH (ref 80.0–100.0)
Platelets: 58 10*3/uL — ABNORMAL LOW (ref 150–400)
RBC: 4.09 MIL/uL (ref 3.87–5.11)
RDW: 20.1 % — ABNORMAL HIGH (ref 11.5–15.5)
WBC: 11.7 10*3/uL — ABNORMAL HIGH (ref 4.0–10.5)
nRBC: 1 % — ABNORMAL HIGH (ref 0.0–0.2)

## 2020-02-12 LAB — VITAMIN B12: Vitamin B-12: 4382 pg/mL — ABNORMAL HIGH (ref 180–914)

## 2020-02-12 MED ORDER — FUROSEMIDE 10 MG/ML IJ SOLN
40.0000 mg | Freq: Two times a day (BID) | INTRAMUSCULAR | Status: DC
  Administered 2020-02-12 – 2020-02-13 (×3): 40 mg via INTRAVENOUS
  Filled 2020-02-12 (×4): qty 4

## 2020-02-12 NOTE — TOC Initial Note (Signed)
Transition of Care [facility]) - Initial/Assessment Note    Patient Details  Name: Alexandra Hall MRN: [id number] Date of Birth: 03/08/1922  [facility]) CM/SW Contact:    Shade Flood, LCSW Phone Number: 02/12/2020, 2:41 PM  Clinical Narrative:                  Pt admitted from home. Spoke with pt's gdtr, Becky Sax, by phone to assess. Per Becky Sax, pt lives with her Daughter and SIL and Becky Sax lives about 3 miles up the road from them. Becky Sax helps with pt's medications and doctor's appointments. She states that pt has a hospital bed and a walker for DME at home. She states that pt does not ambulate much and that she eats her meals in her living room chair with a tray table. Per Becky Sax, pt follows a low sodium heart healthy diet though she really doesn't eat any meat.  Becky Sax expects that they will take pt home at dc. She is open to [facility] RN and/or PT if needed at dc. PT eval is pending.  Assigned TOC will follow.  Expected Discharge Plan: [place] Barriers to Discharge: Continued Medical Work up   Patient Goals and CMS Choice        Expected Discharge Plan and Services Expected Discharge Plan: [place] In-house Referral: Clinical Social Work     Living arrangements for the past 2 months: [place]                                      Prior Living Arrangements/Services Living arrangements for the past 2 months: Single Family Home Lives with:: Adult Children Patient language and need for interpreter reviewed:: Yes Do you feel safe going back to the place where you live?: Yes      Need for Family Participation in Patient Care: Yes (Comment) Care giver support system in place?: Yes (comment) Current home services: DME Criminal Activity/Legal Involvement Pertinent to Current Situation/Hospitalization: No - Comment as needed  Activities of Daily Living      Permission Sought/Granted                  Emotional  Assessment       Orientation: : Oriented to Self, Oriented to Place Alcohol / Substance Use: Not Applicable Psych Involvement: No (comment)  Admission diagnosis:  CHF (congestive heart failure) (HCC) [Y10.1] Systolic congestive heart failure, unspecified HF chronicity ([place]) [I50.20] Patient Active Problem List   Diagnosis Date Noted  . CHF (congestive heart failure) ([place]) 02/11/2020  . Thrombocytopenia ([place]) 02/11/2020  . Pressure injury of skin 02/11/2020  . Acute on chronic combined systolic and diastolic CHF (congestive heart failure) ([place]) 02/11/2020  . Acute respiratory failure with hypoxia ([place]) 02/11/2020  . History of complete heart block 02/11/2020  . Senile purpura ([place]) 06/27/2015  . TRICUSPID VALVE DISORDER 12/29/2008  . RENAL ARTERY STENOSIS 12/29/2008  . DYSPNEA 12/29/2008  . AORTIC VALVE REPLACEMENT, HX OF 12/29/2008  . PACEMAKER, PERMANENT 12/29/2008  . COLON CANCER 12/23/2008  . BREAST CANCER 12/23/2008  . Hypothyroidism 12/23/2008  . Hyperlipidemia 12/23/2008  . Coronary atherosclerosis 12/23/2008  . HEART BLOCK 12/23/2008  . BRADYCARDIA 12/23/2008  . GERD 12/23/2008  . CKD (chronic kidney disease), stage IV ([place]) 12/23/2008  . OSTEOPOROSIS 12/23/2008  . CAROTID BRUIT 12/23/2008  . HTN (hypertension) 12/23/2008   PCP:  Wolfgang Phoenix,  Elayne Snare, MD Pharmacy:   [facility], [place] [address] Phone: [phone] Fax: [phone]  [facility] - [address], [place] [address] Phone: [phone] Fax: [phone]     Social Determinants of Health (SDOH) Interventions    Readmission Risk Interventions Readmission Risk Prevention Plan 02/12/2020  Transportation Screening Complete  Home Care Screening Complete  Medication Review (RN CM) Complete  Some recent data might be hidden

## 2020-02-12 NOTE — Progress Notes (Signed)
PROGRESS NOTE    Alexandra Hall  IDP:[id number] DOB: 06-12-1922 DOA: 02/10/2020 PCP: Kathyrn Drown, MD    Brief Narrative:  Alexandra Hall  is a 86 y.o. female, with history of sinus node dysfunction, renal insufficiency, renal artery stenosis, osteoporosis, myocardial infarction, hypothyroidism, hypertension, hyperlipidemia, GERD, coronary artery disease, and more presents to the ED with a chief complaint of shortness of breath.  Patient opens eyes to voice but is nonverbal at this time.  History is taken from daughter.  She reports that initially patient was brought into the ER for labored breathing.  It started 2 days ago.  Patient does not wear oxygen at baseline.  He noticed increased difficulty breathing throughout the day.  They called PCP who advised to take patient in to be seen.  At baseline patient cannot get up on her own, into a car, onto an exam table, etc. so they decided to bring her into the ED versus urgent care.  At baseline patient is coherent she knows what year it is she recognizes people around her.  They report that her mental status has not been at baseline in last 48 hours.  She has been very quiet, fatigued and wanting to sleep all the time.  She has not been drinking or eating.  And she has not been coherent when she does speak.  No further history can be obtained.  ED course Temperature 97.4, heart rate 75, respiratory rate 24, blood pressure 157/72 satting at 95% on 2 L nasal cannula No leukocytosis with a white blood cell count of 10.7 CHEM panel shows hyperkalemia at 5.7, baseline creatinine at 2.25, hyperglycemia 148 BNP is greater than 4500 Troponins downtrending from 75-65 Covid is negative Chest x-ray shows small lung volumes right pleural effusion atelectasis versus infiltrate within the left midlung zone Lasix 40 mg given one-time IV in the ED Last echo was August 2013 -results cannot be seen in record   Assessment & Plan:   Active Problems:    Hypothyroidism   Hyperlipidemia   CKD (chronic kidney disease), stage IV (HCC)   HTN (hypertension)   PACEMAKER, PERMANENT   CHF (congestive heart failure) (HCC)   Thrombocytopenia (HCC)   Pressure injury of skin   Acute on chronic combined systolic and diastolic CHF (congestive heart failure) ([place])   Acute respiratory failure with hypoxia (HCC)   History of complete heart block   1. Acute respiratory failure with hypoxia.  Secondary to decompensated CHF.  Continue to wean off oxygen as tolerated. 2. Acute combined CHF.  Ejection fraction 30 to 35% with grade 2 diastolic dysfunction on echocardiogram.  On previous echo from 2013, EF was noted to be 55%.  There was mention of significant wall motion abnormalities on echocardiogram.  Unclear if these are acute changes.  Troponins have been unremarkable.  Discussed further work-up with patient's family who do not wish to pursue invasive cardiac work-up including cardiac catheterization.  They are agreeable for medical management which seem to be reasonable.  The patient is already on beta-blockers.  Would avoid ACE inhibitors in light of renal dysfunction.  Continue diuresis with IV Lasix since she still has evidence of volume overload. 3. Chronic kidney disease stage IV.  Mild improvement of creatinine with diuresis.  We will continue to monitor in the setting of diuresis. 4. Thrombocytopenia.  Unclear etiology.  She does not appear septic or toxic.  TSH noted to be normal.  B12 is elevated.  No signs of bleeding at this  time.  Continue to monitor. 5. History of complete heart block status post pacemaker 6. Hypertension.  Blood pressure currently stable. 7. Hypothyroidism.  Continue Synthroid 8. Hyperlipidemia.  We will continue statin.  Pressure Injury 02/11/20 Sacrum Mid Stage 2 -  Partial thickness loss of dermis presenting as a shallow open injury with a red, pink wound bed without slough. (Active)  02/11/20 0215  Location: Sacrum    Location Orientation: Mid  Staging: Stage 2 -  Partial thickness loss of dermis presenting as a shallow open injury with a red, pink wound bed without slough.  Wound Description (Comments):   Present on Admission: Yes   -Continue supportive care.      DVT prophylaxis: SCDs Start: 02/11/20 0209  Code Status: DNR Family Communication: Updated daughter and son-in-law over the phone Disposition Plan: Status is: Inpatient  Remains inpatient appropriate because:IV treatments appropriate due to intensity of illness or inability to take PO   Dispo: The patient is from: Home              Anticipated d/c is to: Home              Anticipated d/c date is: 2 days              Patient currently is not medically stable to d/c.  Patient needs continued diuresis for volume overload   Consultants:     Procedures:   Echo:1. Left ventricular ejection fraction, by estimation, is 30 to 35%. The  left ventricle has moderately decreased function. The left ventricle  demonstrates regional wall motion abnormalities (see scoring  diagram/findings for description). There is mild  left ventricular hypertrophy. Left ventricular diastolic parameters are  consistent with Grade II diastolic dysfunction (pseudonormalization).  There is akinesis of the left ventricular, anterior wall, anteroseptal  wall and apical segment.  2. There is a filamentous. mobile vegetation on the RV lead. . Right  ventricular systolic function is moderately reduced. The right ventricular  size is moderately enlarged. There is severely elevated pulmonary artery  systolic pressure.  3. Left atrial size was severely dilated.  4. Right atrial size was moderately dilated.  5. The mitral valve is abnormal. Moderate mitral valve regurgitation.  Mild mitral stenosis.  6. The tricuspid valve is abnormal. The tricuspid valve is status post  repair with an annuloplasty ring. Tricuspid valve regurgitation is severe.  7. The  aortic valve has been repaired/replaced. Aortic valve  regurgitation is mild to moderate. Moderate to severe aortic valve  stenosis. There is a bioprosthetic valve present in the aortic position.  8. The inferior vena cava is normal in size with greater than 50%  respiratory variability, suggesting right atrial pressure of 3 mmHg.   Antimicrobials:      Subjective: No chest pain.  Shortness of breath improving.  Continues to have lower extremity edema.  Breathing not back to baseline yet.  Objective: Vitals:   02/11/20 2112 02/12/20 0500 02/12/20 0509 02/12/20 1411  BP: (!) 159/93  (!) 150/75 (!) 144/58  Pulse: 74  74 73  Resp: 20  20 18   Temp: 97.9 F (36.6 C)  97.6 F (36.4 C) 98 F (36.7 C)  TempSrc: Oral  Oral Oral  SpO2: 100%  (!) 84% 92%  Weight:  51.7 kg    Height:        Intake/Output Summary (Last 24 hours) at 02/12/2020 1949 Last data filed at 02/12/2020 1805 Gross per 24 hour  Intake 60 ml  Output 1200  ml  Net -1140 ml   Filed Weights   02/10/20 1939 02/11/20 0210 02/12/20 0500  Weight: 54.4 kg 54.1 kg 51.7 kg    Examination:  General exam: Alert, awake, oriented x 3 Respiratory system: crackles at bases. Respiratory effort normal. Cardiovascular system:RRR. No murmurs, rubs, gallops. Gastrointestinal system: Abdomen is nondistended, soft and nontender. No organomegaly or masses felt. Normal bowel sounds heard. Central nervous system: Alert and oriented. No focal neurological deficits. Extremities: 1+ edema bilaterally Skin: No rashes, lesions or ulcers Psychiatry: Judgement and insight appear normal. Mood & affect appropriate.      Data Reviewed: I have personally reviewed following labs and imaging studies  CBC: Recent Labs  Lab 02/10/20 1948 02/11/20 0611 02/12/20 0641  WBC 10.7* 9.4 11.7*  NEUTROABS 8.8* 7.1  --   HGB 13.8 13.0 13.7  HCT 43.4 41.9 45.0  MCV 108.8* 108.3* 110.0*  PLT 64* 57* 58*   Basic Metabolic Panel: Recent Labs    Lab 02/10/20 1948 02/10/20 2345 02/11/20 0611 02/11/20 1146 02/12/20 0641  NA 141  --  142  --  139  K 5.7* 5.2* 5.1 4.7 3.8  CL 107  --  110  --  104  CO2 23  --  24  --  23  GLUCOSE 148*  --  105*  --  85  BUN 58*  --  60*  --  60*  CREATININE 2.25*  --  2.23*  --  1.98*  CALCIUM 8.6*  --  8.4*  --  8.5*  MG  --   --  2.4  --   --    GFR: Estimated Creatinine Clearance: 13.6 mL/min (A) (by C-G formula based on SCr of 1.98 mg/dL (H)). Liver Function Tests: Recent Labs  Lab 02/10/20 1948 02/11/20 0611 02/12/20 0641  AST 35 27 30  ALT 39 32 31  ALKPHOS 181* 168* 161*  BILITOT 2.2* 1.7* 2.2*  PROT 7.0 6.0* 6.2*  ALBUMIN 3.5 3.0* 3.0*   No results for input(s): LIPASE, AMYLASE in the last 168 hours. No results for input(s): AMMONIA in the last 168 hours. Coagulation Profile: No results for input(s): INR, PROTIME in the last 168 hours. Cardiac Enzymes: No results for input(s): CKTOTAL, CKMB, CKMBINDEX, TROPONINI in the last 168 hours. BNP (last 3 results) No results for input(s): PROBNP in the last 8760 hours. HbA1C: No results for input(s): HGBA1C in the last 72 hours. CBG: Recent Labs  Lab 02/11/20 1636 02/11/20 2114  GLUCAP 93 94   Lipid Profile: No results for input(s): CHOL, HDL, LDLCALC, TRIG, CHOLHDL, LDLDIRECT in the last 72 hours. Thyroid Function Tests: Recent Labs    02/11/20 0611  TSH 1.992   Anemia Panel: Recent Labs    02/11/20 0611  VITAMINB12 4,382*   Sepsis Labs: No results for input(s): PROCALCITON, LATICACIDVEN in the last 168 hours.  Recent Results (from the past 240 hour(s))  SARS Coronavirus 2 by RT PCR (hospital order, performed in [facility] hospital lab) Nasopharyngeal Nasopharyngeal Swab     Status: None   Collection Time: 02/10/20  7:53 PM   Specimen: Nasopharyngeal Swab  Result Value Ref Range Status   SARS Coronavirus 2 NEGATIVE NEGATIVE Final    Comment: (NOTE) SARS-CoV-2 target nucleic acids are NOT DETECTED.  The  SARS-CoV-2 RNA is generally detectable in upper and lower respiratory specimens during the acute phase of infection. The lowest concentration of SARS-CoV-2 viral copies this assay can detect is 250 copies / mL. A negative result  does not preclude SARS-CoV-2 infection and should not be used as the sole basis for treatment or other patient management decisions.  A negative result may occur with improper specimen collection / handling, submission of specimen other than nasopharyngeal swab, presence of viral mutation(s) within the areas targeted by this assay, and inadequate number of viral copies (<250 copies / mL). A negative result must be combined with clinical observations, patient history, and epidemiological information.  Fact Sheet for Patients:   StrictlyIdeas.no  Fact Sheet for Healthcare Providers: BankingDealers.co.za  This test is not yet approved or  cleared by the [place] FDA and has been authorized for detection and/or diagnosis of SARS-CoV-2 by FDA under an Emergency Use Authorization (EUA).  This EUA will remain in effect (meaning this test can be used) for the duration of the COVID-19 declaration under Section 564(b)(1) of the Act, 21 U.S.C. section 360bbb-3(b)(1), unless the authorization is terminated or revoked sooner.  Performed at [facility], [address], [place], [place] [address]          Radiology Studies: CT HEAD WO CONTRAST  Result Date: 02/11/2020 CLINICAL DATA:  Encephalopathy EXAM: CT HEAD WITHOUT CONTRAST TECHNIQUE: Contiguous axial images were obtained from the base of the skull through the vertex without intravenous contrast. COMPARISON:  None. FINDINGS: Brain: There is no mass, hemorrhage or extra-axial collection. The size and configuration of the ventricles and extra-axial CSF spaces are normal. There is hypoattenuation of the white matter, most commonly indicating chronic small vessel  disease. Vascular: No abnormal hyperdensity of the major intracranial arteries or dural venous sinuses. No intracranial atherosclerosis. Skull: The visualized skull base, calvarium and extracranial soft tissues are normal. Sinuses/Orbits: No fluid levels or advanced mucosal thickening of the visualized paranasal sinuses. No mastoid or middle ear effusion. The orbits are normal. Other: There is an inferior left parotid mass is incompletely visualized but measures at least 2.5 cm. This is increased in size compared to 08/14/2005. IMPRESSION: 1. No acute intracranial abnormality. 2. Chronic small vessel disease. 3. Incompletely visualized inferior left parotid mass, measuring at least 2.5 cm. This is increased in size compared to 08/14/2005. Electronically Signed   By: Ulyses Jarred M.D.   On: 02/11/2020 02:06   DG Chest Port 1 View  Result Date: 02/10/2020 CLINICAL DATA:  Dyspnea EXAM: PORTABLE CHEST 1 VIEW COMPARISON:  12/23/2010 FINDINGS: Lung volumes are extremely small with resultant vascular crowding at the hila. Small right pleural effusion is present. Atelectasis or infiltrate is seen within the left mid lung zone, partially obscured by overlying pacemaker. No pneumothorax. Left subclavian dual lead pacemaker is unchanged. Aortic and tricuspid valve replacement has been performed. Cardiac size is mildly enlarged, unchanged. IMPRESSION: 1. Extremely small lung volumes. 2. Small right pleural effusion. 3. Atelectasis or infiltrate within the left mid lung zone. Electronically Signed   By: Fidela Salisbury MD   On: 02/10/2020 23:15   ECHOCARDIOGRAM COMPLETE  Result Date: 02/11/2020    ECHOCARDIOGRAM REPORT   Patient Name:   Aliena A Rosales Date of Exam: 02/11/2020 Medical Rec #:  [id number]      Height:       65.0 in Accession #:    [id number]     Weight:       119.3 lb Date of Birth:  07-13-1921       BSA:          1.588 m Patient Age:    3 years       BP:  114/58 mmHg Patient Gender: F               HR:           74 bpm. Exam Location:  [facility] Procedure: 2D Echo Indications:    CHF-Acute Systolic 161.09 / U04.54  History:        Patient has no prior history of Echocardiogram examinations.                 CHF, Signs/Symptoms:Dyspnea; Risk Factors:Hypertension and                 Dyslipidemia. COLON CANCER, BREAST CANCER, Bradycardia, Chronic                 renal disease, TRICUSPID VALVE DISORDER, Sinus node dysfunction.                 Aortic Valve: bioprosthetic valve is present in the aortic                 position.  Sonographer:    Leavy Cella RDCS (AE) Referring Phys: [id number] ASIA B [place]  1. Left ventricular ejection fraction, by estimation, is 30 to 35%. The left ventricle has moderately decreased function. The left ventricle demonstrates regional wall motion abnormalities (see scoring diagram/findings for description). There is mild left ventricular hypertrophy. Left ventricular diastolic parameters are consistent with Grade II diastolic dysfunction (pseudonormalization). There is akinesis of the left ventricular, anterior wall, anteroseptal wall and apical segment.  2. There is a filamentous. mobile vegetation on the RV lead. . Right ventricular systolic function is moderately reduced. The right ventricular size is moderately enlarged. There is severely elevated pulmonary artery systolic pressure.  3. Left atrial size was severely dilated.  4. Right atrial size was moderately dilated.  5. The mitral valve is abnormal. Moderate mitral valve regurgitation. Mild mitral stenosis.  6. The tricuspid valve is abnormal. The tricuspid valve is status post repair with an annuloplasty ring. Tricuspid valve regurgitation is severe.  7. The aortic valve has been repaired/replaced. Aortic valve regurgitation is mild to moderate. Moderate to severe aortic valve stenosis. There is a bioprosthetic valve present in the aortic position.  8. The inferior vena cava is normal in size with greater  than 50% respiratory variability, suggesting right atrial pressure of 3 mmHg. FINDINGS  Left Ventricle: Left ventricular ejection fraction, by estimation, is 30 to 35%. The left ventricle has moderately decreased function. The left ventricle demonstrates regional wall motion abnormalities. The left ventricular internal cavity size was normal in size. There is mild left ventricular hypertrophy. Left ventricular diastolic parameters are consistent with Grade II diastolic dysfunction (pseudonormalization). Right Ventricle: There is a filamentous. mobile vegetation on the RV lead. The right ventricular size is moderately enlarged. No increase in right ventricular wall thickness. Right ventricular systolic function is moderately reduced. There is severely elevated pulmonary artery systolic pressure. The tricuspid regurgitant velocity is 4.04 m/s, and with an assumed right atrial pressure of 10 mmHg, the estimated right ventricular systolic pressure is 47.8 mmHg. Left Atrium: Left atrial size was severely dilated. Right Atrium: Right atrial size was moderately dilated. Pericardium: There is no evidence of pericardial effusion. Mitral Valve: The mitral valve is abnormal. There is moderate thickening of the mitral valve leaflet(s). Moderately decreased mobility of the mitral valve leaflets. Severe mitral annular calcification. Moderate mitral valve regurgitation. Mild mitral valve stenosis. MV peak gradient, 6.6 mmHg. The mean mitral valve gradient is 3.0 mmHg. Tricuspid Valve: The tricuspid  valve is abnormal. Tricuspid valve regurgitation is severe. The tricuspid valve is status post repair with an annuloplasty ring. Aortic Valve: The aortic valve has been repaired/replaced. Aortic valve regurgitation is mild to moderate. Aortic regurgitation PHT measures 295 msec. Moderate to severe aortic stenosis is present. Aortic valve mean gradient measures 36.7 mmHg. Aortic valve peak gradient measures 62.7 mmHg. Aortic valve area,  by VTI measures 0.39 cm. There is a bioprosthetic valve present in the aortic position. Pulmonic Valve: The pulmonic valve was not well visualized. Pulmonic valve regurgitation is trivial. Aorta: The aortic root and ascending aorta are structurally normal, with no evidence of dilitation. Venous: The inferior vena cava is normal in size with greater than 50% respiratory variability, suggesting right atrial pressure of 3 mmHg. IAS/Shunts: No atrial level shunt detected by color flow Doppler. Additional Comments: A pacer wire is visualized.  LEFT VENTRICLE PLAX 2D LVIDd:         3.64 cm  Diastology LVIDs:         3.06 cm  LV e' lateral:   4.65 cm/s LV PW:         1.30 cm  LV E/e' lateral: 28.8 LV IVS:        1.25 cm LVOT diam:     2.00 cm LV SV:         39 LV SV Index:   25 LVOT Area:     3.14 cm  RIGHT VENTRICLE RV S prime:     7.05 cm/s TAPSE (M-mode): 1.2 cm LEFT ATRIUM             Index       RIGHT ATRIUM           Index LA diam:        4.40 cm 2.77 cm/m  RA Area:     18.70 cm LA Vol (A2C):   79.9 ml 50.32 ml/m RA Volume:   52.60 ml  33.12 ml/m LA Vol (A4C):   69.5 ml 43.77 ml/m LA Biplane Vol: 79.8 ml 50.25 ml/m  AORTIC VALVE AV Area (Vmax):    0.45 cm AV Area (Vmean):   0.43 cm AV Area (VTI):     0.39 cm AV Vmax:           396.00 cm/s AV Vmean:          285.000 cm/s AV VTI:            0.994 m AV Peak Grad:      62.7 mmHg AV Mean Grad:      36.7 mmHg LVOT Vmax:         56.10 cm/s LVOT Vmean:        38.933 cm/s LVOT VTI:          0.125 m LVOT/AV VTI ratio: 0.13 AI PHT:            295 msec  AORTA Ao Root diam: 2.60 cm MITRAL VALVE                TRICUSPID VALVE MV Area (PHT): 4.41 cm     TR Peak grad:   65.3 mmHg MV Peak grad:  6.6 mmHg     TR Vmax:        404.00 cm/s MV Mean grad:  3.0 mmHg MV Vmax:       1.28 m/s     SHUNTS MV Vmean:      85.3 cm/s    Systemic VTI:  0.12 m MV Decel Time:  172 msec     Systemic Diam: 2.00 cm MR Peak grad: 169.0 mmHg MR Mean grad: 101.0 mmHg MR Vmax:      650.00 cm/s MR  Vmean:     460.0 cm/s MV E velocity: 134.00 cm/s MV A velocity: 108.00 cm/s MV E/A ratio:  1.24 Glori Bickers MD Electronically signed by Glori Bickers MD Signature Date/Time: 02/11/2020/3:05:47 PM    Final         Scheduled Meds: . aspirin EC  81 mg Oral Daily  . calcitRIOL  0.25 mcg Oral Daily  . furosemide  40 mg Intravenous Daily  . levothyroxine  88 mcg Oral Q0600  . mouth rinse  15 mL Mouth Rinse BID  . metoprolol succinate  25 mg Oral BID  . sodium chloride flush  3 mL Intravenous Q12H   Continuous Infusions: . sodium chloride       LOS: 1 day    Time spent: 14mins    Kathie Dike, MD [facility]   If 7PM-7AM, please contact night-coverage [email or web address]  02/12/2020, 7:49 PM

## 2020-02-13 LAB — CBC
HCT: 39.7 % (ref 36.0–46.0)
Hemoglobin: 12.6 g/dL (ref 12.0–15.0)
MCH: 33.6 pg (ref 26.0–34.0)
MCHC: 31.7 g/dL (ref 30.0–36.0)
MCV: 105.9 fL — ABNORMAL HIGH (ref 80.0–100.0)
Platelets: 56 10*3/uL — ABNORMAL LOW (ref 150–400)
RBC: 3.75 MIL/uL — ABNORMAL LOW (ref 3.87–5.11)
RDW: 19.4 % — ABNORMAL HIGH (ref 11.5–15.5)
WBC: 9.7 10*3/uL (ref 4.0–10.5)
nRBC: 1.2 % — ABNORMAL HIGH (ref 0.0–0.2)

## 2020-02-13 LAB — BASIC METABOLIC PANEL
Anion gap: 12 (ref 5–15)
BUN: 55 mg/dL — ABNORMAL HIGH (ref 8–23)
CO2: 23 mmol/L (ref 22–32)
Calcium: 8 mg/dL — ABNORMAL LOW (ref 8.9–10.3)
Chloride: 103 mmol/L (ref 98–111)
Creatinine, Ser: 1.8 mg/dL — ABNORMAL HIGH (ref 0.44–1.00)
GFR calc Af Amer: 27 mL/min — ABNORMAL LOW (ref >60)
GFR calc non Af Amer: 23 mL/min — ABNORMAL LOW (ref >60)
Glucose, Bld: 91 mg/dL (ref 70–99)
Potassium: 3.3 mmol/L — ABNORMAL LOW (ref 3.5–5.1)
Sodium: 138 mmol/L (ref 135–145)

## 2020-02-13 LAB — GLUCOSE, CAPILLARY
Glucose-Capillary: 100 mg/dL — ABNORMAL HIGH (ref 70–99)
Glucose-Capillary: 113 mg/dL — ABNORMAL HIGH (ref 70–99)

## 2020-02-13 NOTE — Progress Notes (Signed)
PROGRESS NOTE    Alexandra Hall  IRW:[id number] DOB: [birth year] DOA: 02/10/2020 PCP: Kathyrn Drown, MD    Brief Narrative:  Alexandra Hall  is a 86 y.o. female, with history of sinus node dysfunction, renal insufficiency, renal artery stenosis, osteoporosis, myocardial infarction, hypothyroidism, hypertension, hyperlipidemia, GERD, coronary artery disease, and more presents to the ED with a chief complaint of shortness of breath.  Patient opens eyes to voice but is nonverbal at this time.  History is taken from daughter.  She reports that initially patient was brought into the ER for labored breathing.  It started 2 days ago.  Patient does not wear oxygen at baseline.  He noticed increased difficulty breathing throughout the day.  They called PCP who advised to take patient in to be seen.  At baseline patient cannot get up on her own, into a car, onto an exam table, etc. so they decided to bring her into the ED versus urgent care.  At baseline patient is coherent she knows what year it is she recognizes people around her.  They report that her mental status has not been at baseline in last 48 hours.  She has been very quiet, fatigued and wanting to sleep all the time.  She has not been drinking or eating.  And she has not been coherent when she does speak.  No further history can be obtained.  ED course Temperature 97.4, heart rate 75, respiratory rate 24, blood pressure 157/72 satting at 95% on 2 L nasal cannula No leukocytosis with a white blood cell count of 10.7 CHEM panel shows hyperkalemia at 5.7, baseline creatinine at 2.25, hyperglycemia 148 BNP is greater than 4500 Troponins downtrending from 75-65 Covid is negative Chest x-ray shows small lung volumes right pleural effusion atelectasis versus infiltrate within the left midlung zone Lasix 40 mg given one-time IV in the ED Last echo was August 2013 -results cannot be seen in record   Assessment & Plan:   Active Problems:    Hypothyroidism   Hyperlipidemia   CKD (chronic kidney disease), stage IV (HCC)   HTN (hypertension)   PACEMAKER, PERMANENT   CHF (congestive heart failure) (HCC)   Thrombocytopenia (HCC)   Pressure injury of skin   Acute on chronic combined systolic and diastolic CHF (congestive heart failure) ([place])   Acute respiratory failure with hypoxia (HCC)   History of complete heart block   1. Acute respiratory failure with hypoxia.  Secondary to decompensated CHF.  Continue to wean off oxygen as tolerated. 2. Acute combined CHF.  Ejection fraction 30 to 35% with grade 2 diastolic dysfunction on echocardiogram.  On previous echo from 2013, EF was noted to be 55%.  There was mention of significant wall motion abnormalities on echocardiogram.  Unclear if these are acute changes.  Troponins have been unremarkable.  Discussed further work-up with patient's family who do not wish to pursue invasive cardiac work-up including cardiac catheterization.  They are agreeable for medical management which is reasonable.  The patient is already on beta-blockers.  Would avoid ACE inhibitors in light of renal dysfunction.  Continue diuresis with IV Lasix since she still has evidence of volume overload. 3. Chronic kidney disease stage IV.  Mild improvement of creatinine with diuresis.  We will continue to monitor in the setting of diuresis. 4. Thrombocytopenia.  Unclear etiology.  She does not appear septic or toxic.  TSH noted to be normal.  B12 is elevated.  No signs of bleeding at this time.  Continue to monitor.  Consider outpatient hematology referral 5. History of complete heart block status post pacemaker 6. Hypertension.  Blood pressure currently stable. 7. Hypothyroidism.  Continue Synthroid 8. Hyperlipidemia.  We will continue statin. 9. Hypokalemia.  Replace  Pressure Injury 02/11/20 Sacrum Mid Stage 2 -  Partial thickness loss of dermis presenting as a shallow open injury with a red, pink wound bed without  slough. (Active)  02/11/20 0215  Location: Sacrum  Location Orientation: Mid  Staging: Stage 2 -  Partial thickness loss of dermis presenting as a shallow open injury with a red, pink wound bed without slough.  Wound Description (Comments):   Present on Admission: Yes   -Continue supportive care.      DVT prophylaxis: SCDs Start: 02/11/20 0209  Code Status: DNR Family Communication: Updated daughter and son-in-law over the phone Disposition Plan: Status is: Inpatient  Remains inpatient appropriate because:IV treatments appropriate due to intensity of illness or inability to take PO   Dispo: The patient is from: Home              Anticipated d/c is to: Home              Anticipated d/c date is: 1 day              Patient currently is not medically stable to d/c.  Patient needs continued diuresis for volume overload   Consultants:     Procedures:   Echo:1. Left ventricular ejection fraction, by estimation, is 30 to 35%. The  left ventricle has moderately decreased function. The left ventricle  demonstrates regional wall motion abnormalities (see scoring  diagram/findings for description). There is mild  left ventricular hypertrophy. Left ventricular diastolic parameters are  consistent with Grade II diastolic dysfunction (pseudonormalization).  There is akinesis of the left ventricular, anterior wall, anteroseptal  wall and apical segment.  2. There is a filamentous. mobile vegetation on the RV lead. . Right  ventricular systolic function is moderately reduced. The right ventricular  size is moderately enlarged. There is severely elevated pulmonary artery  systolic pressure.  3. Left atrial size was severely dilated.  4. Right atrial size was moderately dilated.  5. The mitral valve is abnormal. Moderate mitral valve regurgitation.  Mild mitral stenosis.  6. The tricuspid valve is abnormal. The tricuspid valve is status post  repair with an annuloplasty ring.  Tricuspid valve regurgitation is severe.  7. The aortic valve has been repaired/replaced. Aortic valve  regurgitation is mild to moderate. Moderate to severe aortic valve  stenosis. There is a bioprosthetic valve present in the aortic position.  8. The inferior vena cava is normal in size with greater than 50%  respiratory variability, suggesting right atrial pressure of 3 mmHg.   Antimicrobials:      Subjective: Denies any chest pain.  Denies any shortness of breath.  Still on supplemental oxygen.  Objective: Vitals:   02/12/20 1411 02/12/20 2037 02/13/20 0500 02/13/20 1500  BP: (!) 144/58 (!) 156/76  (!) 156/75  Pulse: 73 75  75  Resp: 18 16  16   Temp: 98 F (36.7 C) 98.6 F (37 C)    TempSrc: Oral Oral    SpO2: 92% 100%  94%  Weight:   50.8 kg   Height:        Intake/Output Summary (Last 24 hours) at 02/13/2020 2006 Last data filed at 02/13/2020 0500 Gross per 24 hour  Intake --  Output 800 ml  Net -800 ml  Filed Weights   02/11/20 0210 02/12/20 0500 02/13/20 0500  Weight: 54.1 kg 51.7 kg 50.8 kg    Examination:  General exam: Alert, awake, oriented x 3 Respiratory system: crackles at bases. Respiratory effort normal. Cardiovascular system:RRR. No murmurs, rubs, gallops. Gastrointestinal system: Abdomen is nondistended, soft and nontender. No organomegaly or masses felt. Normal bowel sounds heard. Central nervous system: Alert and oriented. No focal neurological deficits. Extremities: 1+ edema bilaterally Skin: No rashes, lesions or ulcers Psychiatry: Judgement and insight appear normal. Mood & affect appropriate.      Data Reviewed: I have personally reviewed following labs and imaging studies  CBC: Recent Labs  Lab 02/10/20 1948 02/11/20 0611 02/12/20 0641 02/13/20 0511  WBC 10.7* 9.4 11.7* 9.7  NEUTROABS 8.8* 7.1  --   --   HGB 13.8 13.0 13.7 12.6  HCT 43.4 41.9 45.0 39.7  MCV 108.8* 108.3* 110.0* 105.9*  PLT 64* 57* 58* 56*   Basic  Metabolic Panel: Recent Labs  Lab 02/10/20 1948 02/10/20 1948 02/10/20 2345 02/11/20 0611 02/11/20 1146 02/12/20 0641 02/13/20 0511  NA 141  --   --  142  --  139 138  K 5.7*   < > 5.2* 5.1 4.7 3.8 3.3*  CL 107  --   --  110  --  104 103  CO2 23  --   --  24  --  23 23  GLUCOSE 148*  --   --  105*  --  85 91  BUN 58*  --   --  60*  --  60* 55*  CREATININE 2.25*  --   --  2.23*  --  1.98* 1.80*  CALCIUM 8.6*  --   --  8.4*  --  8.5* 8.0*  MG  --   --   --  2.4  --   --   --    < > = values in this interval not displayed.   GFR: Estimated Creatinine Clearance: 14.7 mL/min (A) (by C-G formula based on SCr of 1.8 mg/dL (H)). Liver Function Tests: Recent Labs  Lab 02/10/20 1948 02/11/20 0611 02/12/20 0641  AST 35 27 30  ALT 39 32 31  ALKPHOS 181* 168* 161*  BILITOT 2.2* 1.7* 2.2*  PROT 7.0 6.0* 6.2*  ALBUMIN 3.5 3.0* 3.0*   No results for input(s): LIPASE, AMYLASE in the last 168 hours. No results for input(s): AMMONIA in the last 168 hours. Coagulation Profile: No results for input(s): INR, PROTIME in the last 168 hours. Cardiac Enzymes: No results for input(s): CKTOTAL, CKMB, CKMBINDEX, TROPONINI in the last 168 hours. BNP (last 3 results) No results for input(s): PROBNP in the last 8760 hours. HbA1C: No results for input(s): HGBA1C in the last 72 hours. CBG: Recent Labs  Lab 02/11/20 0734 02/11/20 1057 02/11/20 1636 02/11/20 2114  GLUCAP 100* 113* 93 94   Lipid Profile: No results for input(s): CHOL, HDL, LDLCALC, TRIG, CHOLHDL, LDLDIRECT in the last 72 hours. Thyroid Function Tests: Recent Labs    02/11/20 0611  TSH 1.992   Anemia Panel: Recent Labs    02/11/20 0611  VITAMINB12 4,382*   Sepsis Labs: No results for input(s): PROCALCITON, LATICACIDVEN in the last 168 hours.  Recent Results (from the past 240 hour(s))  SARS Coronavirus 2 by RT PCR (hospital order, performed in [facility] hospital lab) Nasopharyngeal Nasopharyngeal Swab     Status:  None   Collection Time: 02/10/20  7:53 PM   Specimen: Nasopharyngeal Swab  Result  Value Ref Range Status   SARS Coronavirus 2 NEGATIVE NEGATIVE Final    Comment: (NOTE) SARS-CoV-2 target nucleic acids are NOT DETECTED.  The SARS-CoV-2 RNA is generally detectable in upper and lower respiratory specimens during the acute phase of infection. The lowest concentration of SARS-CoV-2 viral copies this assay can detect is 250 copies / mL. A negative result does not preclude SARS-CoV-2 infection and should not be used as the sole basis for treatment or other patient management decisions.  A negative result may occur with improper specimen collection / handling, submission of specimen other than nasopharyngeal swab, presence of viral mutation(s) within the areas targeted by this assay, and inadequate number of viral copies (<250 copies / mL). A negative result must be combined with clinical observations, patient history, and epidemiological information.  Fact Sheet for Patients:   StrictlyIdeas.no  Fact Sheet for Healthcare Providers: BankingDealers.co.za  This test is not yet approved or  cleared by the [place] FDA and has been authorized for detection and/or diagnosis of SARS-CoV-2 by FDA under an Emergency Use Authorization (EUA).  This EUA will remain in effect (meaning this test can be used) for the duration of the COVID-19 declaration under Section 564(b)(1) of the Act, 21 U.S.C. section 360bbb-3(b)(1), unless the authorization is terminated or revoked sooner.  Performed at [facility], [address], [place], [place] [address]          Radiology Studies: No results found.      Scheduled Meds: . aspirin EC  81 mg Oral Daily  . calcitRIOL  0.25 mcg Oral Daily  . furosemide  40 mg Intravenous BID  . levothyroxine  88 mcg Oral Q0600  . mouth rinse  15 mL Mouth Rinse BID  . metoprolol succinate  25 mg Oral BID  .  sodium chloride flush  3 mL Intravenous Q12H   Continuous Infusions: . sodium chloride       LOS: 2 days    Time spent: 103mins    Kathie Dike, MD [facility]   If 7PM-7AM, please contact night-coverage [email or web address]  02/13/2020, 8:06 PM

## 2020-02-13 NOTE — Plan of Care (Signed)
  Problem: Acute Rehab PT Goals(only PT should resolve) Goal: Pt Will Go Supine/Side To Sit Outcome: Progressing Flowsheets (Taken 02/13/2020 1028) Pt will go Supine/Side to Sit: with minimal assist Goal: Pt Will Go Sit To Supine/Side Outcome: Progressing Flowsheets (Taken 02/13/2020 1028) Pt will go Sit to Supine/Side: with minimal assist Goal: Patient Will Transfer Sit To/From Stand Outcome: Progressing Flowsheets (Taken 02/13/2020 1028) Patient will transfer sit to/from stand: with moderate assist Goal: Pt Will Transfer Bed To Chair/Chair To Bed Outcome: Progressing Flowsheets (Taken 02/13/2020 1028) Pt will Transfer Bed to Chair/Chair to Bed:  with mod assist  with +2 Goal: Pt Will Ambulate Outcome: Progressing Flowsheets (Taken 02/13/2020 1028) Pt will Ambulate:  10 feet  with minimal assist  with rolling walker   Tori Hamdi Vari PT, DPT 02/13/20, 10:29 AM [phone]

## 2020-02-13 NOTE — Evaluation (Signed)
Physical Therapy Evaluation Patient Details Name: Alexandra Hall MRN: [id number] DOB: [birth year] Today's Date: 02/13/2020   History of Present Illness  86 y.o. female, with past medical history significant for osteoporosis, MI, HTN, CABG in 2004 and pacemaker placement,  presents to the ED with a chief complaint of shortness of breath.  Clinical Impression  Pt admitted with above diagnosis. Unable to determine pt's PLOF, per chart pt unable to transfer independently but unsure how much assist pt required. Pt able to come to sitting EOB with mod A from therapist and constant cues. Pt with poor sitting balance at EOB, requiring mod A to maintain upright balance to prevent losing balance backwards or laterally in both directions. Pt unable to coordinate standing at EOB with therapist despite cues and multiple attempts so assisted pt back to supine. Pt able to roll into L sidelying with min A and pillow at back to position pt into L sidelying to provide R side relief; attempted to educate pt on importance of L sidelying and turned on TV in hopes to encourage L sidelying. Pt with nasal cannula not in place upon arrival and SpO2 89% so returned 1L with SpO2 90-93% throughout remainder of eval. Will continue to assess for DME needs once determined what pt has at home. Pt currently with functional limitations due to the deficits listed below (see PT Problem List). Pt will benefit from skilled PT to increase their independence and safety with mobility to allow discharge to the venue listed below.       Follow Up Recommendations SNF;Supervision/Assistance - 24 hour    Equipment Recommendations  Other (comment) (TBD)    Recommendations for Other Services       Precautions / Restrictions Precautions Precautions: Fall Restrictions Weight Bearing Restrictions: No      Mobility  Bed Mobility Overal bed mobility: Needs Assistance Bed Mobility: Supine to Sit;Sit to Supine;Rolling Rolling: Min assist   Supine to sit: Mod assist;HOB elevated Sit to supine: Mod assist;HOB elevated   General bed mobility comments: slow, labored movement, pt actively slides BLE 3-4 inches over to EOB and attempts to upright trunk, ultimately mod A to come to sitting EOB with multimodal cues, poor sitting balance with significant posterior pelvic tilt requiring mod A to maintain upright sitting; able to roll into L sidelying with min A and use of bedrail  Transfers  General transfer comment: unable to cue pt to stand from EOB with therapist assisting  Ambulation/Gait  General Gait Details: not attempted  Stairs            Wheelchair Mobility    Modified Rankin (Stroke Patients Only)       Balance Overall balance assessment: Needs assistance Sitting-balance support: Feet supported;Bilateral upper extremity supported Sitting balance-Leahy Scale: Poor Sitting balance - Comments: seated EOB, significant posterior pelvic tilt, posterior lean with lateral lean both directions when therapist releases assist Postural control: Posterior lean     Standing balance comment: not attempted        Pertinent Vitals/Pain Pain Assessment: Faces (Pt shakes head and states "no" when asked if has any pain) Faces Pain Scale: No hurt    Home Living Family/patient expects to be discharged to:: Private residence Living Arrangements: Children (per chart review, daughter and son in law) Available Help at Discharge: Family;Available 24 hours/day  Home Equipment: Walker - 2 wheels;Hospital bed      Prior Function Level of Independence: Needs assistance   Gait / Transfers Assistance Needed: Per chart review- pt  has hospital bed and RW, pt requires physical assist with mobility  ADL's / Homemaking Assistance Needed: Per chart review- family assists with medications  Comments: Information obtained from chart review, pt unable to report home set up or PLOF.  Per chart review, family provides transportation      Hand Dominance        Extremity/Trunk Assessment   Upper Extremity Assessment Upper Extremity Assessment: Generalized weakness;Difficult to assess due to impaired cognition    Lower Extremity Assessment Lower Extremity Assessment: Generalized weakness;Difficult to assess due to impaired cognition (AROM ~50% throughout, unable to MMT due to cognition, reports "feels a little less" to light touch in bil feet)    Cervical / Trunk Assessment Cervical / Trunk Assessment: Kyphotic  Communication   Communication: HOH  Cognition Arousal/Alertness: Lethargic Behavior During Therapy: Flat affect Overall Cognitive Status: No family/caregiver present to determine baseline cognitive functioning  General Comments: Pt is pleasant confused. Pt oriented to name only. Pt states current age is 86, President Radford Pax is in office and unable to state location or situation. Once reoriented, pt unable to repeat back to therapist.      General Comments General comments (skin integrity, edema, etc.): pt on RA upon arrival with nasal cannula around neck and SpO2 89%, returned 1L and cued for pursed lip breathing with SpO2 93-90% throughout session    Exercises     Assessment/Plan    PT Assessment Patient needs continued PT services  PT Problem List Decreased strength;Decreased range of motion;Decreased activity tolerance;Decreased balance;Decreased mobility;Decreased cognition;Decreased knowledge of use of DME;Decreased safety awareness;Impaired sensation       PT Treatment Interventions DME instruction;Gait training;Functional mobility training;Therapeutic activities;Therapeutic exercise;Balance training;Neuromuscular re-education;Cognitive remediation;Patient/family education    PT Goals (Current goals can be found in the Care Plan section)  Acute Rehab PT Goals Patient Stated Goal: none stated PT Goal Formulation: Patient unable to participate in goal setting Time For Goal Achievement:  02/27/20 Potential to Achieve Goals: Fair    Frequency Min 2X/week   Barriers to discharge        Co-evaluation               AM-PAC PT "6 Clicks" Mobility  Outcome Measure Help needed turning from your back to your side while in a flat bed without using bedrails?: A Little Help needed moving from lying on your back to sitting on the side of a flat bed without using bedrails?: Total Help needed moving to and from a bed to a chair (including a wheelchair)?: Total Help needed standing up from a chair using your arms (e.g., wheelchair or bedside chair)?: Total Help needed to walk in hospital room?: Total Help needed climbing 3-5 steps with a railing? : Total 6 Click Score: 8    End of Session Equipment Utilized During Treatment: Gait belt Activity Tolerance: Patient limited by fatigue;Patient limited by lethargy Patient left: in bed;with call bell/phone within reach;with bed alarm set Nurse Communication: Mobility status PT Visit Diagnosis: Unsteadiness on feet (R26.81);Other abnormalities of gait and mobility (R26.89);Muscle weakness (generalized) (M62.81)    Time: 1191-4782 PT Time Calculation (min) (ACUTE ONLY): 18 min   Charges:   PT Evaluation $PT Eval Moderate Complexity: 1 Mod           Tori Maksymilian Mabey PT, DPT 02/13/20, 10:23 AM [phone]

## 2020-02-13 NOTE — TOC Progression Note (Signed)
Transition of Care [facility]) - Progression Note    Patient Details  Name: Alexandra Hall MRN: [id number] Date of Birth: 1922/01/12  [facility]) CM/SW Contact  Boneta Lucks, RN Phone Number: 02/13/2020, 12:55 PM  Clinical Narrative:   PT is recommending SNF.  TOC spoke with granddaugher Becky Sax. Patient lives at home with [place] parents. Becky Sax provides transportation and Curator. She is wanting Home Health RN/PT.  Linda with [facility] accepted the referral. Added to AVS. Discharge planning is for tomorrow. RN updated     Expected Discharge Plan: [place] Barriers to Discharge: Continued Medical Work up  Expected Discharge Plan and Services Expected Discharge Plan: [place] In-house Referral: Clinical Social Work     Living arrangements for the past 2 months: [place]: RN, PT [facility] Agency: [place] ([facility]) Date [place]: 02/13/20 Time [place]: 1253 Representative spoke with at [place]: Romualdo Bolk   Readmission Risk Interventions Readmission Risk Prevention Plan 02/12/2020  Transportation Screening Complete  Home Care Screening Complete  Medication Review (RN CM) Complete  Some recent data might be hidden

## 2020-02-14 DIAGNOSIS — I10 Essential (primary) hypertension: Secondary | ICD-10-CM

## 2020-02-14 LAB — BASIC METABOLIC PANEL
Anion gap: 13 (ref 5–15)
BUN: 54 mg/dL — ABNORMAL HIGH (ref 8–23)
CO2: 24 mmol/L (ref 22–32)
Calcium: 8.2 mg/dL — ABNORMAL LOW (ref 8.9–10.3)
Chloride: 101 mmol/L (ref 98–111)
Creatinine, Ser: 1.84 mg/dL — ABNORMAL HIGH (ref 0.44–1.00)
GFR calc Af Amer: 26 mL/min — ABNORMAL LOW (ref >60)
GFR calc non Af Amer: 23 mL/min — ABNORMAL LOW (ref >60)
Glucose, Bld: 139 mg/dL — ABNORMAL HIGH (ref 70–99)
Potassium: 3.4 mmol/L — ABNORMAL LOW (ref 3.5–5.1)
Sodium: 138 mmol/L (ref 135–145)

## 2020-02-14 MED ORDER — FUROSEMIDE 40 MG PO TABS: 40.0000 mg | ORAL_TABLET | Freq: Every day | ORAL | 0 refills | Status: AC

## 2020-02-14 NOTE — TOC Transition Note (Signed)
Transition of Care [facility]) - CM/SW Discharge Note   Patient Details  Name: Alexandra Hall MRN: [id number] Date of Birth: 11-Oct-1921  Transition of Care [facility]) CM/SW Contact:  Iona Beard, [place] Phone Number: 02/14/2020, 11:18 AM   Clinical Narrative:    Pt discharging home today. Pt qualifies for home O2, Rodney with [facility] has accepted referral. Updated Vaughan Basta with [facility]. Updated granddaughter/Sonja who will transport pt home.    Final next level of care: [place] Barriers to Discharge: Barriers Resolved   Patient Goals and CMS Choice Patient states their goals for this hospitalization and ongoing recovery are:: Patient would like to return home. CMS Medicare.gov Compare Post Acute Care list provided to:: Patient Represenative (must comment) Choice offered to / list presented to : Adult Children  Discharge Placement                Patient to be transferred to facility by: Family Name of family member notified: Esaw Grandchild Patient and family notified of of transfer: 02/14/20  Discharge Plan and Services In-house Referral: Clinical Social Work              DME Arranged: Oxygen DME Agency: [facility] Date DME Agency Contacted: 02/14/20 Time DME Agency Contacted: [phone] Representative spoke with at DME Agency: Barbaraann Rondo HH Arranged: RN, PT [facility] Agency: [place] ([place]) Date [place]: 02/13/20 Time [place]: 1253 Representative spoke with at [place]: [place] ([place]) Interventions     Readmission Risk Interventions Readmission Risk Prevention Plan 02/12/2020  Transportation Screening Complete  Home Care Screening Complete  Medication Review (RN CM) Complete  Some recent data might be hidden

## 2020-02-14 NOTE — Progress Notes (Signed)
SATURATION QUALIFICATIONS: (This note is used to comply with regulatory documentation for home oxygen)  Patient Saturations on Room Air at Rest = 88%  Patient Saturations on Room Air while Ambulating = 84%  Patient Saturations on 2 Liters of oxygen while Ambulating = % 97  

## 2020-02-14 NOTE — Progress Notes (Signed)
Patient had loss of IV access.  Notified mid-level. Received okay to leave if access out for now.

## 2020-02-14 NOTE — Plan of Care (Signed)

## 2020-02-14 NOTE — Discharge Summary (Signed)
Physician Discharge Summary  Alexandra Hall BPZ:[id number] DOB: [birth year] DOA: 02/10/2020  PCP: Kathyrn Drown, MD  Admit date: 02/10/2020 Discharge date: 02/14/2020  Admitted From: Home Disposition: Home  Recommendations for Outpatient Follow-up:  1. Follow up with PCP in 1-2 weeks 2. Please obtain BMP/CBC in one week 3. Outpatient follow-up with cardiology has been scheduled 4. Consider outpatient referral to hematology for thrombocytopenia if further work-up desired.  Home Health: Home health PT, RN Equipment/Devices: Oxygen at 2 L  Discharge Condition: Stable CODE STATUS: DNR Diet recommendation: Heart healthy  Brief/Interim Summary: [name] a86 y.o.female,with history of sinus node dysfunction, renal insufficiency, renal artery stenosis, osteoporosis, myocardial infarction, hypothyroidism, hypertension, hyperlipidemia, GERD, coronary artery disease, and more presents to the ED with a chief complaint of shortness of breath. Patient opens eyes to voice but is nonverbal at this time. History is taken from daughter. She reports that initially patient was brought into the ER for labored breathing. It started 2 days ago. Patient does not wear oxygen at baseline. He noticed increased difficulty breathing throughout the day. They called PCP who advised to take patient in to be seen. At baseline patient cannot get up on her own, into a car, onto an exam table, etc. so they decided to bring her into the ED versus urgent care. At baseline patient is coherent she knows what year it is she recognizes people around her. They report that her mental status has not been at baseline in last 48 hours. She has been very quiet, fatigued and wanting to sleep all the time. She has not been drinking or eating. And she has not been coherent when she does speak. No further history can be obtained.  ED course Temperature 97.4, heart rate 75, respiratory rate 24, blood pressure 157/72  satting at 95% on 2 L nasal cannula No leukocytosis with a white blood cell count of 10.7 CHEM panel shows hyperkalemia at 5.7, baseline creatinine at 2.25, hyperglycemia 148 BNP is greater than 4500 Troponins downtrending from 75-65 Covid is negative Chest x-ray shows small lung volumes right pleural effusion atelectasis versus infiltrate within the left midlung zone Lasix 40 mg given one-time IV in the ED Last echo was August 2013-results cannot be seen in record  Discharge Diagnoses:  Active Problems:   Hypothyroidism   Hyperlipidemia   CKD (chronic kidney disease), stage IV (HCC)   HTN (hypertension)   PACEMAKER, PERMANENT   CHF (congestive heart failure) (HCC)   Thrombocytopenia (HCC)   Pressure injury of skin   Acute on chronic combined systolic and diastolic CHF (congestive heart failure) ([place])   Acute respiratory failure with hypoxia (HCC)   History of complete heart block  1. Acute respiratory failure with hypoxia.  Secondary to decompensated CHF.    Patient was discharged home with 2 L of oxygen.  Respiratory status currently stable. 2. Acute combined CHF.  Ejection fraction 30 to 35% with grade 2 diastolic dysfunction on echocardiogram.  On previous echo from 2013, EF was noted to be 55%.  There was mention of significant wall motion abnormalities on echocardiogram.  Unclear if these are acute changes.  Troponins have been unremarkable.  Discussed further work-up with patient's family who do not wish to pursue invasive cardiac work-up including cardiac catheterization.  They are agreeable for medical management which is reasonable.  The patient is already on beta-blockers.  Would avoid ACE inhibitors in light of renal dysfunction.  Overall volume status has improved with IV diuretics and she is been  transitioned back to oral diuretics.  Follow-up with cardiology has been scheduled. 3. Chronic kidney disease stage IV.  Mild improvement of creatinine with diuresis.  We will  continue to monitor in the setting of diuresis. 4. Thrombocytopenia.  Unclear etiology.  She does not appear septic or toxic.  TSH noted to be normal.  B12 is elevated.  No signs of bleeding at this time.  Continue to monitor.  Consider outpatient hematology referral 5. History of complete heart block status post pacemaker 6. Hypertension.  Blood pressure currently stable. 7. Hypothyroidism.  Continue Synthroid 8. Hyperlipidemia.  We will continue statin. 9. Hypokalemia.  Replace  Pressure Injury 02/11/20 Sacrum Mid Stage 2 -  Partial thickness loss of dermis presenting as a shallow open injury with a red, pink wound bed without slough. (Active)  02/11/20 0215  Location: Sacrum  Location Orientation: Mid  Staging: Stage 2 -  Partial thickness loss of dermis presenting as a shallow open injury with a red, pink wound bed without slough.  Wound Description (Comments):   Present on Admission: Yes   -Continue supportive care.      Discharge Instructions  Discharge Instructions    Diet - low sodium heart healthy   Complete by: As directed    Discharge wound care:   Complete by: As directed    Continue current padded dressings daily, turn frequently and keep wound clean   Increase activity slowly   Complete by: As directed      Allergies as of 02/14/2020      Reactions   Adhesive [tape] Other (See Comments)   Makes skin blister   Bactrim [sulfamethoxazole-trimethoprim]    Unknown   Propoxyphene N-acetaminophen Nausea And Vomiting      Medication List    STOP taking these medications   calcitRIOL 0.25 MCG capsule Commonly known as: ROCALTROL   colchicine 0.6 MG tablet   fexofenadine 180 MG tablet Commonly known as: ALLEGRA   omeprazole 20 MG capsule Commonly known as: PRILOSEC     TAKE these medications   acetaminophen 500 MG tablet Commonly known as: TYLENOL Take 500 mg by mouth every 6 (six) hours as needed (pain).   allopurinol 100 MG tablet Commonly  known as: ZYLOPRIM Take 100 mg by mouth 2 (two) times daily.   aspirin 81 MG tablet Take 81 mg by mouth daily.   furosemide 40 MG tablet Commonly known as: LASIX Take 1 tablet (40 mg total) by mouth daily. What changed:   medication strength  how much to take   levothyroxine 88 MCG tablet Commonly known as: SYNTHROID TAKE 1 TABLET BY MOUTH ONCE A DAY IN THE MORNING FOR THYROID.   metoprolol succinate 25 MG 24 hr tablet Commonly known as: TOPROL-XL Take 25 mg by mouth 2 (two) times daily.   potassium chloride 10 MEQ tablet Commonly known as: KLOR-CON Take 10 mEq by mouth daily.   silver sulfADIAZINE 1 % cream Commonly known as: Silvadene Apply small amount to affected area twice day as needed   VITAMIN B-12 PO Take 2,500 mcg by mouth daily.            Durable Medical Equipment  (From admission, onward)         Start     Ordered   02/14/20 1145  For home use only DME oxygen  Once       Question Answer Comment  Length of Need Lifetime   Mode or (Route) Nasal cannula   Liters per Minute 2  Oxygen conserving device Yes   Oxygen delivery system Gas      02/14/20 1146           Discharge Care Instructions  (From admission, onward)         Start     Ordered   02/14/20 0000  Discharge wound care:       Comments: Continue current padded dressings daily, turn frequently and keep wound clean   02/14/20 [facility] Follow up.   Why:   RN/ PT  - they will call and schedule an appointment within 48 hour of discharge.       [facility] Follow up.   Why: Home oxygen       Erma Heritage, PA-C Follow up on 02/17/2020.   Specialties: Physician Assistant, Cardiology Why: Cardiology Hospital Follow-up on 02/17/2020 at 1:30 with Bernerd Pho, PA-C (works with Dr. Lovena Le).  Contact information: [address] [place] [address] [phone]              Allergies   Allergen Reactions  . Adhesive [Tape] Other (See Comments)    Makes skin blister  . Bactrim [Sulfamethoxazole-Trimethoprim]     Unknown  . Propoxyphene N-Acetaminophen Nausea And Vomiting    Consultations:     Procedures/Studies: CT HEAD WO CONTRAST  Result Date: 02/11/2020 CLINICAL DATA:  Encephalopathy EXAM: CT HEAD WITHOUT CONTRAST TECHNIQUE: Contiguous axial images were obtained from the base of the skull through the vertex without intravenous contrast. COMPARISON:  None. FINDINGS: Brain: There is no mass, hemorrhage or extra-axial collection. The size and configuration of the ventricles and extra-axial CSF spaces are normal. There is hypoattenuation of the white matter, most commonly indicating chronic small vessel disease. Vascular: No abnormal hyperdensity of the major intracranial arteries or dural venous sinuses. No intracranial atherosclerosis. Skull: The visualized skull base, calvarium and extracranial soft tissues are normal. Sinuses/Orbits: No fluid levels or advanced mucosal thickening of the visualized paranasal sinuses. No mastoid or middle ear effusion. The orbits are normal. Other: There is an inferior left parotid mass is incompletely visualized but measures at least 2.5 cm. This is increased in size compared to 08/14/2005. IMPRESSION: 1. No acute intracranial abnormality. 2. Chronic small vessel disease. 3. Incompletely visualized inferior left parotid mass, measuring at least 2.5 cm. This is increased in size compared to 08/14/2005. Electronically Signed   By: Ulyses Jarred M.D.   On: 02/11/2020 02:06   DG Chest Port 1 View  Result Date: 02/10/2020 CLINICAL DATA:  Dyspnea EXAM: PORTABLE CHEST 1 VIEW COMPARISON:  12/23/2010 FINDINGS: Lung volumes are extremely small with resultant vascular crowding at the hila. Small right pleural effusion is present. Atelectasis or infiltrate is seen within the left mid lung zone, partially obscured by overlying pacemaker. No pneumothorax.  Left subclavian dual lead pacemaker is unchanged. Aortic and tricuspid valve replacement has been performed. Cardiac size is mildly enlarged, unchanged. IMPRESSION: 1. Extremely small lung volumes. 2. Small right pleural effusion. 3. Atelectasis or infiltrate within the left mid lung zone. Electronically Signed   By: Fidela Salisbury MD   On: 02/10/2020 23:15   ECHOCARDIOGRAM COMPLETE  Result Date: 02/11/2020    ECHOCARDIOGRAM REPORT   Patient Name:   Keamber A Lenger Date of Exam: 02/11/2020 Medical Rec #:  [id number]      Height:       65.0 in Accession #:  [id number]     Weight:       119.3 lb Date of Birth:  1921/12/13       BSA:          1.588 m Patient Age:    85 years       BP:           114/58 mmHg Patient Gender: F              HR:           74 bpm. Exam Location:  [facility] Procedure: 2D Echo Indications:    CHF-Acute Systolic 222.97 / L89.21  History:        Patient has no prior history of Echocardiogram examinations.                 CHF, Signs/Symptoms:Dyspnea; Risk Factors:Hypertension and                 Dyslipidemia. COLON CANCER, BREAST CANCER, Bradycardia, Chronic                 renal disease, TRICUSPID VALVE DISORDER, Sinus node dysfunction.                 Aortic Valve: bioprosthetic valve is present in the aortic                 position.  Sonographer:    Leavy Cella RDCS (AE) Referring Phys: [id number] [address]  1. Left ventricular ejection fraction, by estimation, is 30 to 35%. The left ventricle has moderately decreased function. The left ventricle demonstrates regional wall motion abnormalities (see scoring diagram/findings for description). There is mild left ventricular hypertrophy. Left ventricular diastolic parameters are consistent with Grade II diastolic dysfunction (pseudonormalization). There is akinesis of the left ventricular, anterior wall, anteroseptal wall and apical segment.  2. There is a filamentous. mobile vegetation on the RV lead. . Right  ventricular systolic function is moderately reduced. The right ventricular size is moderately enlarged. There is severely elevated pulmonary artery systolic pressure.  3. Left atrial size was severely dilated.  4. Right atrial size was moderately dilated.  5. The mitral valve is abnormal. Moderate mitral valve regurgitation. Mild mitral stenosis.  6. The tricuspid valve is abnormal. The tricuspid valve is status post repair with an annuloplasty ring. Tricuspid valve regurgitation is severe.  7. The aortic valve has been repaired/replaced. Aortic valve regurgitation is mild to moderate. Moderate to severe aortic valve stenosis. There is a bioprosthetic valve present in the aortic position.  8. The inferior vena cava is normal in size with greater than 50% respiratory variability, suggesting right atrial pressure of 3 mmHg. FINDINGS  Left Ventricle: Left ventricular ejection fraction, by estimation, is 30 to 35%. The left ventricle has moderately decreased function. The left ventricle demonstrates regional wall motion abnormalities. The left ventricular internal cavity size was normal in size. There is mild left ventricular hypertrophy. Left ventricular diastolic parameters are consistent with Grade II diastolic dysfunction (pseudonormalization). Right Ventricle: There is a filamentous. mobile vegetation on the RV lead. The right ventricular size is moderately enlarged. No increase in right ventricular wall thickness. Right ventricular systolic function is moderately reduced. There is severely elevated pulmonary artery systolic pressure. The tricuspid regurgitant velocity is 4.04 m/s, and with an assumed right atrial pressure of 10 mmHg, the estimated right ventricular systolic pressure is 81.4 mmHg. Left Atrium: Left atrial size was severely dilated. Right Atrium: Right atrial size was moderately dilated. Pericardium:  There is no evidence of pericardial effusion. Mitral Valve: The mitral valve is abnormal. There is  moderate thickening of the mitral valve leaflet(s). Moderately decreased mobility of the mitral valve leaflets. Severe mitral annular calcification. Moderate mitral valve regurgitation. Mild mitral valve stenosis. MV peak gradient, 6.6 mmHg. The mean mitral valve gradient is 3.0 mmHg. Tricuspid Valve: The tricuspid valve is abnormal. Tricuspid valve regurgitation is severe. The tricuspid valve is status post repair with an annuloplasty ring. Aortic Valve: The aortic valve has been repaired/replaced. Aortic valve regurgitation is mild to moderate. Aortic regurgitation PHT measures 295 msec. Moderate to severe aortic stenosis is present. Aortic valve mean gradient measures 36.7 mmHg. Aortic valve peak gradient measures 62.7 mmHg. Aortic valve area, by VTI measures 0.39 cm. There is a bioprosthetic valve present in the aortic position. Pulmonic Valve: The pulmonic valve was not well visualized. Pulmonic valve regurgitation is trivial. Aorta: The aortic root and ascending aorta are structurally normal, with no evidence of dilitation. Venous: The inferior vena cava is normal in size with greater than 50% respiratory variability, suggesting right atrial pressure of 3 mmHg. IAS/Shunts: No atrial level shunt detected by color flow Doppler. Additional Comments: A pacer wire is visualized.  LEFT VENTRICLE PLAX 2D LVIDd:         3.64 cm  Diastology LVIDs:         3.06 cm  LV e' lateral:   4.65 cm/s LV PW:         1.30 cm  LV E/e' lateral: 28.8 LV IVS:        1.25 cm LVOT diam:     2.00 cm LV SV:         39 LV SV Index:   25 LVOT Area:     3.14 cm  RIGHT VENTRICLE RV S prime:     7.05 cm/s TAPSE (M-mode): 1.2 cm LEFT ATRIUM             Index       RIGHT ATRIUM           Index LA diam:        4.40 cm 2.77 cm/m  RA Area:     18.70 cm LA Vol (A2C):   79.9 ml 50.32 ml/m RA Volume:   52.60 ml  33.12 ml/m LA Vol (A4C):   69.5 ml 43.77 ml/m LA Biplane Vol: 79.8 ml 50.25 ml/m  AORTIC VALVE AV Area (Vmax):    0.45 cm AV Area  (Vmean):   0.43 cm AV Area (VTI):     0.39 cm AV Vmax:           396.00 cm/s AV Vmean:          285.000 cm/s AV VTI:            0.994 m AV Peak Grad:      62.7 mmHg AV Mean Grad:      36.7 mmHg LVOT Vmax:         56.10 cm/s LVOT Vmean:        38.933 cm/s LVOT VTI:          0.125 m LVOT/AV VTI ratio: 0.13 AI PHT:            295 msec  AORTA Ao Root diam: 2.60 cm MITRAL VALVE                TRICUSPID VALVE MV Area (PHT): 4.41 cm     TR Peak grad:   65.3 mmHg MV  Peak grad:  6.6 mmHg     TR Vmax:        404.00 cm/s MV Mean grad:  3.0 mmHg MV Vmax:       1.28 m/s     SHUNTS MV Vmean:      85.3 cm/s    Systemic VTI:  0.12 m MV Decel Time: 172 msec     Systemic Diam: 2.00 cm MR Peak grad: 169.0 mmHg MR Mean grad: 101.0 mmHg MR Vmax:      650.00 cm/s MR Vmean:     460.0 cm/s MV E velocity: 134.00 cm/s MV A velocity: 108.00 cm/s MV E/A ratio:  1.24 Glori Bickers MD Electronically signed by Glori Bickers MD Signature Date/Time: 02/11/2020/3:05:47 PM    Final    CUP PACEART REMOTE DEVICE CHECK  Result Date: 02/01/2020 Scheduled remote reviewed. Normal device function.  Next remote 91 days. JM      Subjective: Feeling better.  Shortness of breath improving.  Discharge Exam: Vitals:   02/13/20 2029 02/14/20 0459 02/14/20 0500 02/14/20 1519  BP: (!) 149/66 (!) 130/53  (!) 122/52  Pulse: 76 75  75  Resp: 18 16  18   Temp: 98 F (36.7 C)     TempSrc: Oral     SpO2: 93% 97%  100%  Weight:   52.7 kg   Height:        General: Pt is alert, awake, not in acute distress Cardiovascular: RRR, S1/S2 +, no rubs, no gallops Respiratory: Coarse breath sounds at left base Abdominal: Soft, NT, ND, bowel sounds + Extremities: no edema, no cyanosis    The results of significant diagnostics from this hospitalization (including imaging, microbiology, ancillary and laboratory) are listed below for reference.     Microbiology: Recent Results (from the past 240 hour(s))  SARS Coronavirus 2 by RT PCR  (hospital order, performed in [facility] hospital lab) Nasopharyngeal Nasopharyngeal Swab     Status: None   Collection Time: 02/10/20  7:53 PM   Specimen: Nasopharyngeal Swab  Result Value Ref Range Status   SARS Coronavirus 2 NEGATIVE NEGATIVE Final    Comment: (NOTE) SARS-CoV-2 target nucleic acids are NOT DETECTED.  The SARS-CoV-2 RNA is generally detectable in upper and lower respiratory specimens during the acute phase of infection. The lowest concentration of SARS-CoV-2 viral copies this assay can detect is 250 copies / mL. A negative result does not preclude SARS-CoV-2 infection and should not be used as the sole basis for treatment or other patient management decisions.  A negative result may occur with improper specimen collection / handling, submission of specimen other than nasopharyngeal swab, presence of viral mutation(s) within the areas targeted by this assay, and inadequate number of viral copies (<250 copies / mL). A negative result must be combined with clinical observations, patient history, and epidemiological information.  Fact Sheet for Patients:   StrictlyIdeas.no  Fact Sheet for Healthcare Providers: BankingDealers.co.za  This test is not yet approved or  cleared by the [place] FDA and has been authorized for detection and/or diagnosis of SARS-CoV-2 by FDA under an Emergency Use Authorization (EUA).  This EUA will remain in effect (meaning this test can be used) for the duration of the COVID-19 declaration under Section 564(b)(1) of the Act, 21 U.S.C. section 360bbb-3(b)(1), unless the authorization is terminated or revoked sooner.  Performed at [facility], [address], [place],  [address]      Labs: BNP (last 3 results) Recent Labs    02/10/20  1948  BNP >6,063.0*   Basic Metabolic Panel: Recent Labs  Lab 02/10/20 1948 02/10/20 2345 02/11/20 0611 02/11/20 1146 02/12/20 0641  02/13/20 0511 02/14/20 0705  NA 141  --  142  --  139 138 138  K 5.7*   < > 5.1 4.7 3.8 3.3* 3.4*  CL 107  --  110  --  104 103 101  CO2 23  --  24  --  23 23 24   GLUCOSE 148*  --  105*  --  85 91 139*  BUN 58*  --  60*  --  60* 55* 54*  CREATININE 2.25*  --  2.23*  --  1.98* 1.80* 1.84*  CALCIUM 8.6*  --  8.4*  --  8.5* 8.0* 8.2*  MG  --   --  2.4  --   --   --   --    < > = values in this interval not displayed.   Liver Function Tests: Recent Labs  Lab 02/10/20 1948 02/11/20 0611 02/12/20 0641  AST 35 27 30  ALT 39 32 31  ALKPHOS 181* 168* 161*  BILITOT 2.2* 1.7* 2.2*  PROT 7.0 6.0* 6.2*  ALBUMIN 3.5 3.0* 3.0*   No results for input(s): LIPASE, AMYLASE in the last 168 hours. No results for input(s): AMMONIA in the last 168 hours. CBC: Recent Labs  Lab 02/10/20 1948 02/11/20 0611 02/12/20 0641 02/13/20 0511  WBC 10.7* 9.4 11.7* 9.7  NEUTROABS 8.8* 7.1  --   --   HGB 13.8 13.0 13.7 12.6  HCT 43.4 41.9 45.0 39.7  MCV 108.8* 108.3* 110.0* 105.9*  PLT 64* 57* 58* 56*   Cardiac Enzymes: No results for input(s): CKTOTAL, CKMB, CKMBINDEX, TROPONINI in the last 168 hours. BNP: Invalid input(s): POCBNP CBG: Recent Labs  Lab 02/11/20 0734 02/11/20 1057 02/11/20 1636 02/11/20 2114  GLUCAP 100* 113* 93 94   D-Dimer No results for input(s): DDIMER in the last 72 hours. Hgb A1c No results for input(s): HGBA1C in the last 72 hours. Lipid Profile No results for input(s): CHOL, HDL, LDLCALC, TRIG, CHOLHDL, LDLDIRECT in the last 72 hours. Thyroid function studies No results for input(s): TSH, T4TOTAL, T3FREE, THYROIDAB in the last 72 hours.  Invalid input(s): FREET3 Anemia work up No results for input(s): VITAMINB12, FOLATE, FERRITIN, TIBC, IRON, RETICCTPCT in the last 72 hours. Urinalysis    Component Value Date/Time   COLORURINE YELLOW 02/11/2020 0615   APPEARANCEUR CLEAR 02/11/2020 0615   LABSPEC 1.013 02/11/2020 0615   PHURINE 5.0 02/11/2020 0615    GLUCOSEU NEGATIVE 02/11/2020 0615   HGBUR NEGATIVE 02/11/2020 0615   BILIRUBINUR NEGATIVE 02/11/2020 0615   KETONESUR NEGATIVE 02/11/2020 0615   PROTEINUR 100 (A) 02/11/2020 0615   NITRITE NEGATIVE 02/11/2020 0615   LEUKOCYTESUR TRACE (A) 02/11/2020 0615   Sepsis Labs Invalid input(s): PROCALCITONIN,  WBC,  LACTICIDVEN Microbiology Recent Results (from the past 240 hour(s))  SARS Coronavirus 2 by RT PCR (hospital order, performed in [facility] hospital lab) Nasopharyngeal Nasopharyngeal Swab     Status: None   Collection Time: 02/10/20  7:53 PM   Specimen: Nasopharyngeal Swab  Result Value Ref Range Status   SARS Coronavirus 2 NEGATIVE NEGATIVE Final    Comment: (NOTE) SARS-CoV-2 target nucleic acids are NOT DETECTED.  The SARS-CoV-2 RNA is generally detectable in upper and lower respiratory specimens during the acute phase of infection. The lowest concentration of SARS-CoV-2 viral copies this assay can detect is 250 copies / mL. A negative result  does not preclude SARS-CoV-2 infection and should not be used as the sole basis for treatment or other patient management decisions.  A negative result may occur with improper specimen collection / handling, submission of specimen other than nasopharyngeal swab, presence of viral mutation(s) within the areas targeted by this assay, and inadequate number of viral copies (<250 copies / mL). A negative result must be combined with clinical observations, patient history, and epidemiological information.  Fact Sheet for Patients:   StrictlyIdeas.no  Fact Sheet for Healthcare Providers: BankingDealers.co.za  This test is not yet approved or  cleared by the [place] FDA and has been authorized for detection and/or diagnosis of SARS-CoV-2 by FDA under an Emergency Use Authorization (EUA).  This EUA will remain in effect (meaning this test can be used) for the duration of the COVID-19  declaration under Section 564(b)(1) of the Act, 21 U.S.C. section 360bbb-3(b)(1), unless the authorization is terminated or revoked sooner.  Performed at [facility], [address] [place], [place] [address]      Time coordinating discharge: 90mins  SIGNED:   Kathie Dike, MD  [facility] 02/14/2020, 8:30 PM   If 7PM-7AM, please contact night-coverage [email or web address]

## 2020-02-15 DIAGNOSIS — I2510 Atherosclerotic heart disease of native coronary artery without angina pectoris: Secondary | ICD-10-CM | POA: Diagnosis not present

## 2020-02-15 DIAGNOSIS — Z950 Presence of cardiac pacemaker: Secondary | ICD-10-CM | POA: Diagnosis not present

## 2020-02-15 DIAGNOSIS — D696 Thrombocytopenia, unspecified: Secondary | ICD-10-CM | POA: Diagnosis not present

## 2020-02-15 DIAGNOSIS — J9601 Acute respiratory failure with hypoxia: Secondary | ICD-10-CM | POA: Diagnosis not present

## 2020-02-15 DIAGNOSIS — E785 Hyperlipidemia, unspecified: Secondary | ICD-10-CM | POA: Diagnosis not present

## 2020-02-15 DIAGNOSIS — Z7982 Long term (current) use of aspirin: Secondary | ICD-10-CM | POA: Diagnosis not present

## 2020-02-15 DIAGNOSIS — I5023 Acute on chronic systolic (congestive) heart failure: Secondary | ICD-10-CM | POA: Diagnosis not present

## 2020-02-15 DIAGNOSIS — I442 Atrioventricular block, complete: Secondary | ICD-10-CM | POA: Diagnosis not present

## 2020-02-15 DIAGNOSIS — I252 Old myocardial infarction: Secondary | ICD-10-CM | POA: Diagnosis not present

## 2020-02-15 DIAGNOSIS — N184 Chronic kidney disease, stage 4 (severe): Secondary | ICD-10-CM | POA: Diagnosis not present

## 2020-02-15 DIAGNOSIS — I130 Hypertensive heart and chronic kidney disease with heart failure and stage 1 through stage 4 chronic kidney disease, or unspecified chronic kidney disease: Secondary | ICD-10-CM | POA: Diagnosis not present

## 2020-02-15 DIAGNOSIS — C50919 Malignant neoplasm of unspecified site of unspecified female breast: Secondary | ICD-10-CM | POA: Diagnosis not present

## 2020-02-15 DIAGNOSIS — Z87891 Personal history of nicotine dependence: Secondary | ICD-10-CM | POA: Diagnosis not present

## 2020-02-15 DIAGNOSIS — K219 Gastro-esophageal reflux disease without esophagitis: Secondary | ICD-10-CM | POA: Diagnosis not present

## 2020-02-15 DIAGNOSIS — C189 Malignant neoplasm of colon, unspecified: Secondary | ICD-10-CM | POA: Diagnosis not present

## 2020-02-15 DIAGNOSIS — E039 Hypothyroidism, unspecified: Secondary | ICD-10-CM | POA: Diagnosis not present

## 2020-02-15 DIAGNOSIS — I701 Atherosclerosis of renal artery: Secondary | ICD-10-CM | POA: Diagnosis not present

## 2020-02-15 DIAGNOSIS — E875 Hyperkalemia: Secondary | ICD-10-CM | POA: Diagnosis not present

## 2020-02-15 DIAGNOSIS — I495 Sick sinus syndrome: Secondary | ICD-10-CM | POA: Diagnosis not present

## 2020-02-15 DIAGNOSIS — Z9981 Dependence on supplemental oxygen: Secondary | ICD-10-CM | POA: Diagnosis not present

## 2020-02-15 DIAGNOSIS — M810 Age-related osteoporosis without current pathological fracture: Secondary | ICD-10-CM | POA: Diagnosis not present

## 2020-02-15 DIAGNOSIS — I5043 Acute on chronic combined systolic (congestive) and diastolic (congestive) heart failure: Secondary | ICD-10-CM | POA: Diagnosis not present

## 2020-02-15 DIAGNOSIS — G9341 Metabolic encephalopathy: Secondary | ICD-10-CM | POA: Diagnosis not present

## 2020-02-16 ENCOUNTER — Telehealth: Payer: Self-pay | Admitting: Family Medicine

## 2020-02-16 NOTE — Telephone Encounter (Signed)
Please advise. Thank you

## 2020-02-16 NOTE — Telephone Encounter (Signed)
May do so

## 2020-02-16 NOTE — Telephone Encounter (Signed)
Cassandra wants to add Social Worker to orders   Pt [phone]

## 2020-02-16 NOTE — Telephone Encounter (Signed)
Plz give

## 2020-02-16 NOTE — Telephone Encounter (Signed)
Orders needed nursing 2 x week for 2 weeks, 1x  week for 7 weeks and 3 prn, eval  for ot, speech therapy  Call back [place] [phone]

## 2020-02-17 ENCOUNTER — Encounter: Payer: Self-pay | Admitting: Student

## 2020-02-17 ENCOUNTER — Telehealth: Payer: Self-pay | Admitting: Family Medicine

## 2020-02-17 ENCOUNTER — Ambulatory Visit (INDEPENDENT_AMBULATORY_CARE_PROVIDER_SITE_OTHER): Payer: Medicare Other | Admitting: Student

## 2020-02-17 ENCOUNTER — Other Ambulatory Visit: Payer: Self-pay

## 2020-02-17 VITALS — BP 104/55 | HR 75 | Ht 65.0 in | Wt 111.0 lb

## 2020-02-17 DIAGNOSIS — I5042 Chronic combined systolic (congestive) and diastolic (congestive) heart failure: Secondary | ICD-10-CM

## 2020-02-17 DIAGNOSIS — N184 Chronic kidney disease, stage 4 (severe): Secondary | ICD-10-CM | POA: Diagnosis not present

## 2020-02-17 DIAGNOSIS — I495 Sick sinus syndrome: Secondary | ICD-10-CM | POA: Diagnosis not present

## 2020-02-17 DIAGNOSIS — I5043 Acute on chronic combined systolic (congestive) and diastolic (congestive) heart failure: Secondary | ICD-10-CM | POA: Diagnosis not present

## 2020-02-17 DIAGNOSIS — I350 Nonrheumatic aortic (valve) stenosis: Secondary | ICD-10-CM | POA: Diagnosis not present

## 2020-02-17 DIAGNOSIS — I252 Old myocardial infarction: Secondary | ICD-10-CM | POA: Diagnosis not present

## 2020-02-17 DIAGNOSIS — I2510 Atherosclerotic heart disease of native coronary artery without angina pectoris: Secondary | ICD-10-CM | POA: Diagnosis not present

## 2020-02-17 DIAGNOSIS — I130 Hypertensive heart and chronic kidney disease with heart failure and stage 1 through stage 4 chronic kidney disease, or unspecified chronic kidney disease: Secondary | ICD-10-CM | POA: Diagnosis not present

## 2020-02-17 DIAGNOSIS — Z79899 Other long term (current) drug therapy: Secondary | ICD-10-CM

## 2020-02-17 DIAGNOSIS — J9601 Acute respiratory failure with hypoxia: Secondary | ICD-10-CM | POA: Diagnosis not present

## 2020-02-17 NOTE — Telephone Encounter (Addendum)
Verbal order given to Cassandra at [facility]

## 2020-02-17 NOTE — Patient Instructions (Signed)
Medication Instructions:  Your physician recommends that you continue on your current medications as directed. Please refer to the Current Medication list given to you today.  *If you need a refill on your cardiac medications before your next appointment, please call your pharmacy*   Lab Work: Your physician recommends that you return for lab work in: 1 week BMET   If you have labs (blood work) drawn today and your tests are completely normal, you will receive your results only by: Marland Kitchen MyChart Message (if you have MyChart) OR . A paper copy in the mail If you have any lab test that is abnormal or we need to change your treatment, we will call you to review the results.   Testing/Procedures: NONE    Follow-Up: At [facility], you and your health needs are our priority.  As part of our continuing mission to provide you with exceptional heart care, we have created designated Provider Care Teams.  These Care Teams include your primary Cardiologist (physician) and Advanced Practice Providers (APPs -  Physician Assistants and Nurse Practitioners) who all work together to provide you with the care you need, when you need it.  We recommend signing up for the patient portal called "MyChart".  Sign up information is provided on this After Visit Summary.  MyChart is used to connect with patients for Virtual Visits (Telemedicine).  Patients are able to view lab/test results, encounter notes, upcoming appointments, etc.  Non-urgent messages can be sent to your provider as well.   To learn more about what you can do with MyChart, go to [email or web address].    Your next appointment:   4-6 week(s)  The format for your next appointment:   In Person  Provider:   Bernerd Pho, PA-C   Other Instructions Thank you for choosing [facility]!

## 2020-02-17 NOTE — Telephone Encounter (Signed)
[facility] from [facility] went to see patient and states she was doing fine and wont need their services. [phone]

## 2020-02-17 NOTE — Telephone Encounter (Signed)
Alexandra Hall with [facility] wanting verbal orders for OT 1 x 4 weeks.   CB# [phone]
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
## 2020-02-17 NOTE — Telephone Encounter (Signed)
May have

## 2020-02-17 NOTE — Telephone Encounter (Addendum)
Verbal order given to [facility] at [facility]

## 2020-02-17 NOTE — Telephone Encounter (Signed)
So noted 

## 2020-02-18 ENCOUNTER — Encounter: Payer: Self-pay | Admitting: Student

## 2020-02-21 DIAGNOSIS — I10 Essential (primary) hypertension: Secondary | ICD-10-CM | POA: Diagnosis not present

## 2020-02-21 DIAGNOSIS — I509 Heart failure, unspecified: Secondary | ICD-10-CM | POA: Diagnosis not present

## 2020-02-21 DIAGNOSIS — I359 Nonrheumatic aortic valve disorder, unspecified: Secondary | ICD-10-CM | POA: Diagnosis not present

## 2020-02-21 DIAGNOSIS — Z950 Presence of cardiac pacemaker: Secondary | ICD-10-CM | POA: Diagnosis not present

## 2020-02-21 DIAGNOSIS — N184 Chronic kidney disease, stage 4 (severe): Secondary | ICD-10-CM | POA: Diagnosis not present

## 2020-02-22 DIAGNOSIS — N184 Chronic kidney disease, stage 4 (severe): Secondary | ICD-10-CM | POA: Diagnosis not present

## 2020-02-22 DIAGNOSIS — I509 Heart failure, unspecified: Secondary | ICD-10-CM | POA: Diagnosis not present

## 2020-02-22 DIAGNOSIS — I359 Nonrheumatic aortic valve disorder, unspecified: Secondary | ICD-10-CM | POA: Diagnosis not present

## 2020-02-22 DIAGNOSIS — Z950 Presence of cardiac pacemaker: Secondary | ICD-10-CM | POA: Diagnosis not present

## 2020-02-22 DIAGNOSIS — I10 Essential (primary) hypertension: Secondary | ICD-10-CM | POA: Diagnosis not present

## 2020-02-23 DIAGNOSIS — I359 Nonrheumatic aortic valve disorder, unspecified: Secondary | ICD-10-CM | POA: Diagnosis not present

## 2020-02-23 DIAGNOSIS — N184 Chronic kidney disease, stage 4 (severe): Secondary | ICD-10-CM | POA: Diagnosis not present

## 2020-02-23 DIAGNOSIS — I10 Essential (primary) hypertension: Secondary | ICD-10-CM | POA: Diagnosis not present

## 2020-02-23 DIAGNOSIS — Z950 Presence of cardiac pacemaker: Secondary | ICD-10-CM | POA: Diagnosis not present

## 2020-02-23 DIAGNOSIS — I509 Heart failure, unspecified: Secondary | ICD-10-CM | POA: Diagnosis not present

## 2020-02-24 DIAGNOSIS — Z950 Presence of cardiac pacemaker: Secondary | ICD-10-CM | POA: Diagnosis not present

## 2020-02-24 DIAGNOSIS — I509 Heart failure, unspecified: Secondary | ICD-10-CM | POA: Diagnosis not present

## 2020-02-24 DIAGNOSIS — I10 Essential (primary) hypertension: Secondary | ICD-10-CM | POA: Diagnosis not present

## 2020-02-24 DIAGNOSIS — N184 Chronic kidney disease, stage 4 (severe): Secondary | ICD-10-CM | POA: Diagnosis not present

## 2020-02-24 DIAGNOSIS — I359 Nonrheumatic aortic valve disorder, unspecified: Secondary | ICD-10-CM | POA: Diagnosis not present

## 2020-02-26 ENCOUNTER — Telehealth: Payer: Self-pay | Admitting: Family Medicine

## 2020-02-26 NOTE — Telephone Encounter (Signed)
Nurses Please connect with Alexandra Hall her daughter or Alexandra Hall the son-in-law Please let them know I am aware that Alexandra Hall is doing poorly (recently in the hospital, currently under a palliative care) Our thoughts are with them If they need anything please have them let us know If they feel that they would benefit from a home visit to let me know as well Please find out currently is patient under palliative care or hospice care? (And who with)

## 2020-02-28 DIAGNOSIS — I509 Heart failure, unspecified: Secondary | ICD-10-CM | POA: Diagnosis not present

## 2020-02-28 DIAGNOSIS — I359 Nonrheumatic aortic valve disorder, unspecified: Secondary | ICD-10-CM | POA: Diagnosis not present

## 2020-02-28 DIAGNOSIS — N184 Chronic kidney disease, stage 4 (severe): Secondary | ICD-10-CM | POA: Diagnosis not present

## 2020-02-28 DIAGNOSIS — Z950 Presence of cardiac pacemaker: Secondary | ICD-10-CM | POA: Diagnosis not present

## 2020-02-28 DIAGNOSIS — I10 Essential (primary) hypertension: Secondary | ICD-10-CM | POA: Diagnosis not present

## 2020-02-28 NOTE — Telephone Encounter (Signed)
Discussed with daughter shelia and son in law frank. He states according to what his daughter told him who set everything up she is under the care of a hospice daughter now and they appreciated the call.

## 2020-02-28 NOTE — Telephone Encounter (Signed)
Hospice care

## 2020-02-29 DIAGNOSIS — Z950 Presence of cardiac pacemaker: Secondary | ICD-10-CM | POA: Diagnosis not present

## 2020-02-29 DIAGNOSIS — I509 Heart failure, unspecified: Secondary | ICD-10-CM | POA: Diagnosis not present

## 2020-02-29 DIAGNOSIS — I10 Essential (primary) hypertension: Secondary | ICD-10-CM | POA: Diagnosis not present

## 2020-02-29 DIAGNOSIS — I359 Nonrheumatic aortic valve disorder, unspecified: Secondary | ICD-10-CM | POA: Diagnosis not present

## 2020-02-29 DIAGNOSIS — N184 Chronic kidney disease, stage 4 (severe): Secondary | ICD-10-CM | POA: Diagnosis not present

## 2020-03-02 DIAGNOSIS — I10 Essential (primary) hypertension: Secondary | ICD-10-CM | POA: Diagnosis not present

## 2020-03-02 DIAGNOSIS — I359 Nonrheumatic aortic valve disorder, unspecified: Secondary | ICD-10-CM | POA: Diagnosis not present

## 2020-03-02 DIAGNOSIS — Z950 Presence of cardiac pacemaker: Secondary | ICD-10-CM | POA: Diagnosis not present

## 2020-03-02 DIAGNOSIS — I509 Heart failure, unspecified: Secondary | ICD-10-CM | POA: Diagnosis not present

## 2020-03-02 DIAGNOSIS — N184 Chronic kidney disease, stage 4 (severe): Secondary | ICD-10-CM | POA: Diagnosis not present

## 2020-03-03 DIAGNOSIS — I359 Nonrheumatic aortic valve disorder, unspecified: Secondary | ICD-10-CM | POA: Diagnosis not present

## 2020-03-03 DIAGNOSIS — Z950 Presence of cardiac pacemaker: Secondary | ICD-10-CM | POA: Diagnosis not present

## 2020-03-03 DIAGNOSIS — N184 Chronic kidney disease, stage 4 (severe): Secondary | ICD-10-CM | POA: Diagnosis not present

## 2020-03-03 DIAGNOSIS — I509 Heart failure, unspecified: Secondary | ICD-10-CM | POA: Diagnosis not present

## 2020-03-03 DIAGNOSIS — I10 Essential (primary) hypertension: Secondary | ICD-10-CM | POA: Diagnosis not present

## 2020-03-04 DIAGNOSIS — I359 Nonrheumatic aortic valve disorder, unspecified: Secondary | ICD-10-CM | POA: Diagnosis not present

## 2020-03-04 DIAGNOSIS — Z950 Presence of cardiac pacemaker: Secondary | ICD-10-CM | POA: Diagnosis not present

## 2020-03-04 DIAGNOSIS — I509 Heart failure, unspecified: Secondary | ICD-10-CM | POA: Diagnosis not present

## 2020-03-04 DIAGNOSIS — I10 Essential (primary) hypertension: Secondary | ICD-10-CM | POA: Diagnosis not present

## 2020-03-04 DIAGNOSIS — N184 Chronic kidney disease, stage 4 (severe): Secondary | ICD-10-CM | POA: Diagnosis not present

## 2020-03-05 DIAGNOSIS — I10 Essential (primary) hypertension: Secondary | ICD-10-CM | POA: Diagnosis not present

## 2020-03-05 DIAGNOSIS — N184 Chronic kidney disease, stage 4 (severe): Secondary | ICD-10-CM | POA: Diagnosis not present

## 2020-03-05 DIAGNOSIS — I359 Nonrheumatic aortic valve disorder, unspecified: Secondary | ICD-10-CM | POA: Diagnosis not present

## 2020-03-05 DIAGNOSIS — Z950 Presence of cardiac pacemaker: Secondary | ICD-10-CM | POA: Diagnosis not present

## 2020-03-05 DIAGNOSIS — I509 Heart failure, unspecified: Secondary | ICD-10-CM | POA: Diagnosis not present

## 2020-03-06 DIAGNOSIS — N184 Chronic kidney disease, stage 4 (severe): Secondary | ICD-10-CM | POA: Diagnosis not present

## 2020-03-06 DIAGNOSIS — Z950 Presence of cardiac pacemaker: Secondary | ICD-10-CM | POA: Diagnosis not present

## 2020-03-06 DIAGNOSIS — I509 Heart failure, unspecified: Secondary | ICD-10-CM | POA: Diagnosis not present

## 2020-03-06 DIAGNOSIS — I10 Essential (primary) hypertension: Secondary | ICD-10-CM | POA: Diagnosis not present

## 2020-03-06 DIAGNOSIS — I359 Nonrheumatic aortic valve disorder, unspecified: Secondary | ICD-10-CM | POA: Diagnosis not present

## 2020-03-07 DIAGNOSIS — N184 Chronic kidney disease, stage 4 (severe): Secondary | ICD-10-CM | POA: Diagnosis not present

## 2020-03-07 DIAGNOSIS — Z950 Presence of cardiac pacemaker: Secondary | ICD-10-CM | POA: Diagnosis not present

## 2020-03-07 DIAGNOSIS — I509 Heart failure, unspecified: Secondary | ICD-10-CM | POA: Diagnosis not present

## 2020-03-07 DIAGNOSIS — I10 Essential (primary) hypertension: Secondary | ICD-10-CM | POA: Diagnosis not present

## 2020-03-07 DIAGNOSIS — I252 Old myocardial infarction: Secondary | ICD-10-CM | POA: Diagnosis not present

## 2020-03-07 DIAGNOSIS — I359 Nonrheumatic aortic valve disorder, unspecified: Secondary | ICD-10-CM | POA: Diagnosis not present

## 2020-03-07 DIAGNOSIS — I2510 Atherosclerotic heart disease of native coronary artery without angina pectoris: Secondary | ICD-10-CM | POA: Diagnosis not present

## 2020-03-07 DIAGNOSIS — I130 Hypertensive heart and chronic kidney disease with heart failure and stage 1 through stage 4 chronic kidney disease, or unspecified chronic kidney disease: Secondary | ICD-10-CM | POA: Diagnosis not present

## 2020-03-08 DIAGNOSIS — N184 Chronic kidney disease, stage 4 (severe): Secondary | ICD-10-CM | POA: Diagnosis not present

## 2020-03-08 DIAGNOSIS — I509 Heart failure, unspecified: Secondary | ICD-10-CM | POA: Diagnosis not present

## 2020-03-08 DIAGNOSIS — I10 Essential (primary) hypertension: Secondary | ICD-10-CM | POA: Diagnosis not present

## 2020-03-08 DIAGNOSIS — Z950 Presence of cardiac pacemaker: Secondary | ICD-10-CM | POA: Diagnosis not present

## 2020-03-08 DIAGNOSIS — I359 Nonrheumatic aortic valve disorder, unspecified: Secondary | ICD-10-CM | POA: Diagnosis not present

## 2020-03-09 DIAGNOSIS — I10 Essential (primary) hypertension: Secondary | ICD-10-CM | POA: Diagnosis not present

## 2020-03-09 DIAGNOSIS — I509 Heart failure, unspecified: Secondary | ICD-10-CM | POA: Diagnosis not present

## 2020-03-09 DIAGNOSIS — N184 Chronic kidney disease, stage 4 (severe): Secondary | ICD-10-CM | POA: Diagnosis not present

## 2020-03-09 DIAGNOSIS — I359 Nonrheumatic aortic valve disorder, unspecified: Secondary | ICD-10-CM | POA: Diagnosis not present

## 2020-03-09 DIAGNOSIS — Z950 Presence of cardiac pacemaker: Secondary | ICD-10-CM | POA: Diagnosis not present

## 2020-03-10 DIAGNOSIS — N184 Chronic kidney disease, stage 4 (severe): Secondary | ICD-10-CM | POA: Diagnosis not present

## 2020-03-10 DIAGNOSIS — I359 Nonrheumatic aortic valve disorder, unspecified: Secondary | ICD-10-CM | POA: Diagnosis not present

## 2020-03-10 DIAGNOSIS — Z950 Presence of cardiac pacemaker: Secondary | ICD-10-CM | POA: Diagnosis not present

## 2020-03-10 DIAGNOSIS — I10 Essential (primary) hypertension: Secondary | ICD-10-CM | POA: Diagnosis not present

## 2020-03-10 DIAGNOSIS — I509 Heart failure, unspecified: Secondary | ICD-10-CM | POA: Diagnosis not present

## 2020-03-11 DIAGNOSIS — I509 Heart failure, unspecified: Secondary | ICD-10-CM | POA: Diagnosis not present

## 2020-03-11 DIAGNOSIS — I10 Essential (primary) hypertension: Secondary | ICD-10-CM | POA: Diagnosis not present

## 2020-03-11 DIAGNOSIS — N184 Chronic kidney disease, stage 4 (severe): Secondary | ICD-10-CM | POA: Diagnosis not present

## 2020-03-11 DIAGNOSIS — Z950 Presence of cardiac pacemaker: Secondary | ICD-10-CM | POA: Diagnosis not present

## 2020-03-11 DIAGNOSIS — I359 Nonrheumatic aortic valve disorder, unspecified: Secondary | ICD-10-CM | POA: Diagnosis not present

## 2020-03-12 DIAGNOSIS — I10 Essential (primary) hypertension: Secondary | ICD-10-CM | POA: Diagnosis not present

## 2020-03-12 DIAGNOSIS — Z950 Presence of cardiac pacemaker: Secondary | ICD-10-CM | POA: Diagnosis not present

## 2020-03-12 DIAGNOSIS — N184 Chronic kidney disease, stage 4 (severe): Secondary | ICD-10-CM | POA: Diagnosis not present

## 2020-03-12 DIAGNOSIS — I509 Heart failure, unspecified: Secondary | ICD-10-CM | POA: Diagnosis not present

## 2020-03-12 DIAGNOSIS — I359 Nonrheumatic aortic valve disorder, unspecified: Secondary | ICD-10-CM | POA: Diagnosis not present

## 2020-03-13 DIAGNOSIS — I10 Essential (primary) hypertension: Secondary | ICD-10-CM | POA: Diagnosis not present

## 2020-03-13 DIAGNOSIS — Z950 Presence of cardiac pacemaker: Secondary | ICD-10-CM | POA: Diagnosis not present

## 2020-03-13 DIAGNOSIS — I359 Nonrheumatic aortic valve disorder, unspecified: Secondary | ICD-10-CM | POA: Diagnosis not present

## 2020-03-13 DIAGNOSIS — N184 Chronic kidney disease, stage 4 (severe): Secondary | ICD-10-CM | POA: Diagnosis not present

## 2020-03-13 DIAGNOSIS — I509 Heart failure, unspecified: Secondary | ICD-10-CM | POA: Diagnosis not present

## 2020-03-14 DIAGNOSIS — I509 Heart failure, unspecified: Secondary | ICD-10-CM | POA: Diagnosis not present

## 2020-03-14 DIAGNOSIS — N184 Chronic kidney disease, stage 4 (severe): Secondary | ICD-10-CM | POA: Diagnosis not present

## 2020-03-14 DIAGNOSIS — I359 Nonrheumatic aortic valve disorder, unspecified: Secondary | ICD-10-CM | POA: Diagnosis not present

## 2020-03-14 DIAGNOSIS — Z950 Presence of cardiac pacemaker: Secondary | ICD-10-CM | POA: Diagnosis not present

## 2020-03-14 DIAGNOSIS — I10 Essential (primary) hypertension: Secondary | ICD-10-CM | POA: Diagnosis not present

## 2020-03-15 DIAGNOSIS — N184 Chronic kidney disease, stage 4 (severe): Secondary | ICD-10-CM | POA: Diagnosis not present

## 2020-03-15 DIAGNOSIS — I509 Heart failure, unspecified: Secondary | ICD-10-CM | POA: Diagnosis not present

## 2020-03-15 DIAGNOSIS — I359 Nonrheumatic aortic valve disorder, unspecified: Secondary | ICD-10-CM | POA: Diagnosis not present

## 2020-03-15 DIAGNOSIS — I10 Essential (primary) hypertension: Secondary | ICD-10-CM | POA: Diagnosis not present

## 2020-03-15 DIAGNOSIS — Z950 Presence of cardiac pacemaker: Secondary | ICD-10-CM | POA: Diagnosis not present

## 2020-03-16 DIAGNOSIS — I509 Heart failure, unspecified: Secondary | ICD-10-CM | POA: Diagnosis not present

## 2020-03-16 DIAGNOSIS — I10 Essential (primary) hypertension: Secondary | ICD-10-CM | POA: Diagnosis not present

## 2020-03-16 DIAGNOSIS — I359 Nonrheumatic aortic valve disorder, unspecified: Secondary | ICD-10-CM | POA: Diagnosis not present

## 2020-03-16 DIAGNOSIS — N184 Chronic kidney disease, stage 4 (severe): Secondary | ICD-10-CM | POA: Diagnosis not present

## 2020-03-16 DIAGNOSIS — Z950 Presence of cardiac pacemaker: Secondary | ICD-10-CM | POA: Diagnosis not present

## 2020-03-19 ENCOUNTER — Ambulatory Visit: Payer: Medicare Other | Admitting: Family Medicine

## 2020-03-19 DIAGNOSIS — I10 Essential (primary) hypertension: Secondary | ICD-10-CM | POA: Diagnosis not present

## 2020-03-19 DIAGNOSIS — N184 Chronic kidney disease, stage 4 (severe): Secondary | ICD-10-CM | POA: Diagnosis not present

## 2020-03-19 DIAGNOSIS — Z950 Presence of cardiac pacemaker: Secondary | ICD-10-CM | POA: Diagnosis not present

## 2020-03-19 DIAGNOSIS — I509 Heart failure, unspecified: Secondary | ICD-10-CM | POA: Diagnosis not present

## 2020-03-19 DIAGNOSIS — I359 Nonrheumatic aortic valve disorder, unspecified: Secondary | ICD-10-CM | POA: Diagnosis not present

## 2020-03-20 DIAGNOSIS — I359 Nonrheumatic aortic valve disorder, unspecified: Secondary | ICD-10-CM | POA: Diagnosis not present

## 2020-03-20 DIAGNOSIS — Z950 Presence of cardiac pacemaker: Secondary | ICD-10-CM | POA: Diagnosis not present

## 2020-03-20 DIAGNOSIS — I10 Essential (primary) hypertension: Secondary | ICD-10-CM | POA: Diagnosis not present

## 2020-03-20 DIAGNOSIS — N184 Chronic kidney disease, stage 4 (severe): Secondary | ICD-10-CM | POA: Diagnosis not present

## 2020-03-20 DIAGNOSIS — I509 Heart failure, unspecified: Secondary | ICD-10-CM | POA: Diagnosis not present

## 2020-03-21 DIAGNOSIS — I10 Essential (primary) hypertension: Secondary | ICD-10-CM | POA: Diagnosis not present

## 2020-03-21 DIAGNOSIS — N184 Chronic kidney disease, stage 4 (severe): Secondary | ICD-10-CM | POA: Diagnosis not present

## 2020-03-21 DIAGNOSIS — I509 Heart failure, unspecified: Secondary | ICD-10-CM | POA: Diagnosis not present

## 2020-03-21 DIAGNOSIS — Z950 Presence of cardiac pacemaker: Secondary | ICD-10-CM | POA: Diagnosis not present

## 2020-03-21 DIAGNOSIS — I359 Nonrheumatic aortic valve disorder, unspecified: Secondary | ICD-10-CM | POA: Diagnosis not present

## 2020-03-22 DIAGNOSIS — I359 Nonrheumatic aortic valve disorder, unspecified: Secondary | ICD-10-CM | POA: Diagnosis not present

## 2020-03-22 DIAGNOSIS — I509 Heart failure, unspecified: Secondary | ICD-10-CM | POA: Diagnosis not present

## 2020-03-22 DIAGNOSIS — N184 Chronic kidney disease, stage 4 (severe): Secondary | ICD-10-CM | POA: Diagnosis not present

## 2020-03-22 DIAGNOSIS — I10 Essential (primary) hypertension: Secondary | ICD-10-CM | POA: Diagnosis not present

## 2020-03-22 DIAGNOSIS — Z950 Presence of cardiac pacemaker: Secondary | ICD-10-CM | POA: Diagnosis not present

## 2020-03-23 DIAGNOSIS — I10 Essential (primary) hypertension: Secondary | ICD-10-CM | POA: Diagnosis not present

## 2020-03-23 DIAGNOSIS — I359 Nonrheumatic aortic valve disorder, unspecified: Secondary | ICD-10-CM | POA: Diagnosis not present

## 2020-03-23 DIAGNOSIS — I509 Heart failure, unspecified: Secondary | ICD-10-CM | POA: Diagnosis not present

## 2020-03-23 DIAGNOSIS — Z950 Presence of cardiac pacemaker: Secondary | ICD-10-CM | POA: Diagnosis not present

## 2020-03-23 DIAGNOSIS — N184 Chronic kidney disease, stage 4 (severe): Secondary | ICD-10-CM | POA: Diagnosis not present

## 2020-03-26 DIAGNOSIS — Z950 Presence of cardiac pacemaker: Secondary | ICD-10-CM | POA: Diagnosis not present

## 2020-03-26 DIAGNOSIS — I509 Heart failure, unspecified: Secondary | ICD-10-CM | POA: Diagnosis not present

## 2020-03-26 DIAGNOSIS — I359 Nonrheumatic aortic valve disorder, unspecified: Secondary | ICD-10-CM | POA: Diagnosis not present

## 2020-03-26 DIAGNOSIS — I10 Essential (primary) hypertension: Secondary | ICD-10-CM | POA: Diagnosis not present

## 2020-03-26 DIAGNOSIS — N184 Chronic kidney disease, stage 4 (severe): Secondary | ICD-10-CM | POA: Diagnosis not present

## 2020-03-27 DIAGNOSIS — I10 Essential (primary) hypertension: Secondary | ICD-10-CM | POA: Diagnosis not present

## 2020-03-27 DIAGNOSIS — I359 Nonrheumatic aortic valve disorder, unspecified: Secondary | ICD-10-CM | POA: Diagnosis not present

## 2020-03-27 DIAGNOSIS — I509 Heart failure, unspecified: Secondary | ICD-10-CM | POA: Diagnosis not present

## 2020-03-27 DIAGNOSIS — Z950 Presence of cardiac pacemaker: Secondary | ICD-10-CM | POA: Diagnosis not present

## 2020-03-27 DIAGNOSIS — N184 Chronic kidney disease, stage 4 (severe): Secondary | ICD-10-CM | POA: Diagnosis not present

## 2020-03-28 DIAGNOSIS — Z950 Presence of cardiac pacemaker: Secondary | ICD-10-CM | POA: Diagnosis not present

## 2020-03-28 DIAGNOSIS — N184 Chronic kidney disease, stage 4 (severe): Secondary | ICD-10-CM | POA: Diagnosis not present

## 2020-03-28 DIAGNOSIS — I359 Nonrheumatic aortic valve disorder, unspecified: Secondary | ICD-10-CM | POA: Diagnosis not present

## 2020-03-28 DIAGNOSIS — I10 Essential (primary) hypertension: Secondary | ICD-10-CM | POA: Diagnosis not present

## 2020-03-28 DIAGNOSIS — I509 Heart failure, unspecified: Secondary | ICD-10-CM | POA: Diagnosis not present

## 2020-03-29 DIAGNOSIS — N184 Chronic kidney disease, stage 4 (severe): Secondary | ICD-10-CM | POA: Diagnosis not present

## 2020-03-29 DIAGNOSIS — I509 Heart failure, unspecified: Secondary | ICD-10-CM | POA: Diagnosis not present

## 2020-03-29 DIAGNOSIS — Z950 Presence of cardiac pacemaker: Secondary | ICD-10-CM | POA: Diagnosis not present

## 2020-03-29 DIAGNOSIS — I359 Nonrheumatic aortic valve disorder, unspecified: Secondary | ICD-10-CM | POA: Diagnosis not present

## 2020-03-29 DIAGNOSIS — I10 Essential (primary) hypertension: Secondary | ICD-10-CM | POA: Diagnosis not present

## 2020-03-30 DIAGNOSIS — Z950 Presence of cardiac pacemaker: Secondary | ICD-10-CM | POA: Diagnosis not present

## 2020-03-30 DIAGNOSIS — I10 Essential (primary) hypertension: Secondary | ICD-10-CM | POA: Diagnosis not present

## 2020-03-30 DIAGNOSIS — N184 Chronic kidney disease, stage 4 (severe): Secondary | ICD-10-CM | POA: Diagnosis not present

## 2020-03-30 DIAGNOSIS — I509 Heart failure, unspecified: Secondary | ICD-10-CM | POA: Diagnosis not present

## 2020-03-30 DIAGNOSIS — I359 Nonrheumatic aortic valve disorder, unspecified: Secondary | ICD-10-CM | POA: Diagnosis not present

## 2020-04-23 DEATH — deceased

## 2020-04-24 ENCOUNTER — Ambulatory Visit: Payer: Medicare Other | Admitting: Student

## 2022-02-11 IMAGING — CT CT HEAD W/O CM
3 series · 15 of 47 positions shown, 18 images · non-contrast
Comparison: None.

CLINICAL DATA: Encephalopathy

EXAM:
CT HEAD WITHOUT CONTRAST
TECHNIQUE: Contiguous axial images were obtained from the base of the skull
through the vertex without intravenous contrast.

[Series 2: head w o · axial · 0.42mm/px · z∈[-113,+12]mm · 9 of 31 slices shown, 12 images]
[im 3/31  brain]
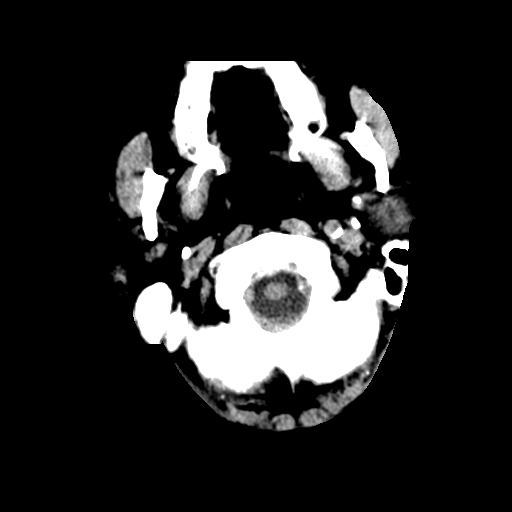
[im 3/31  bone]
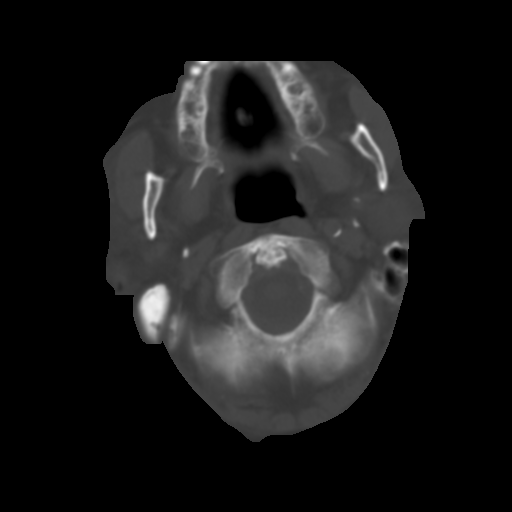
[im 6/31  brain]
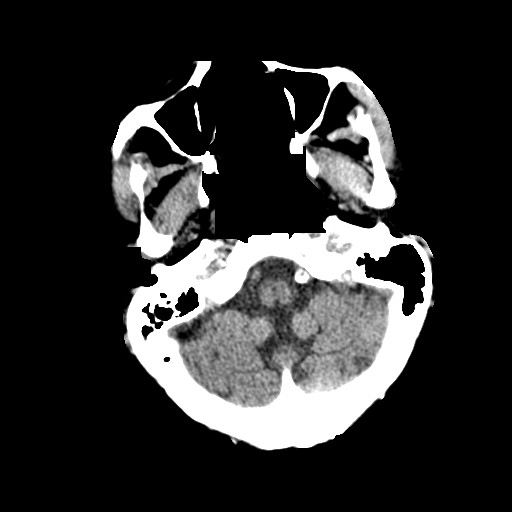
[im 9/31  brain]
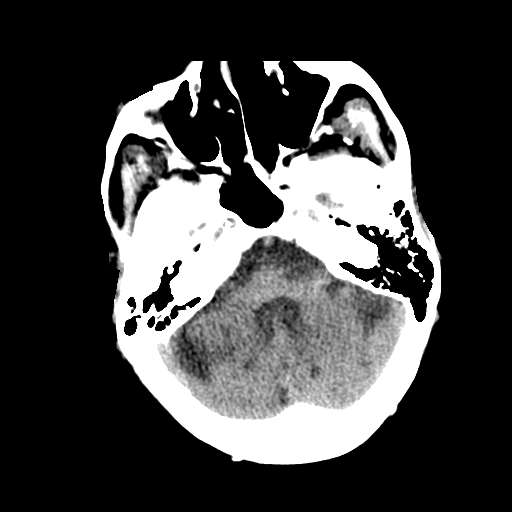
[im 12/31  brain]
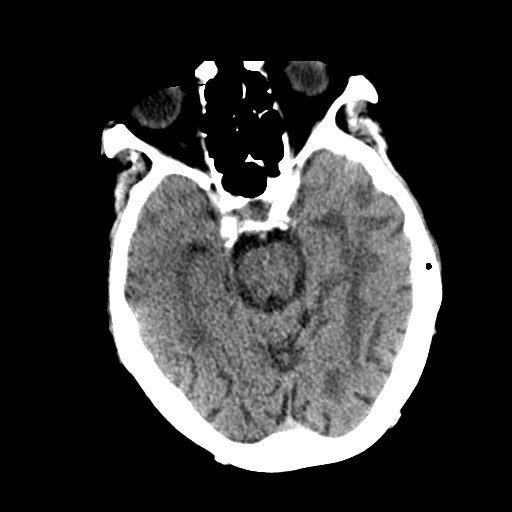
[im 16/31  brain]
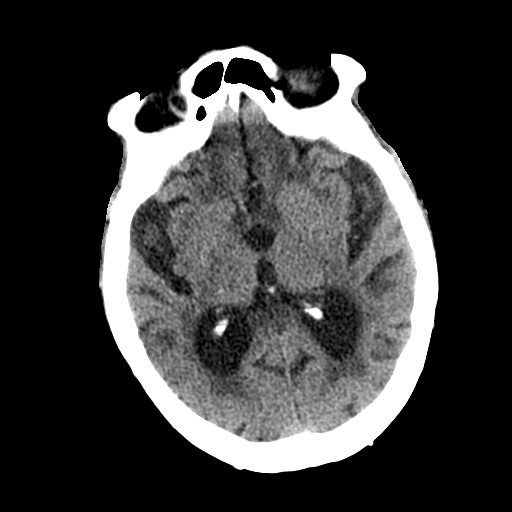
[im 16/31  bone]
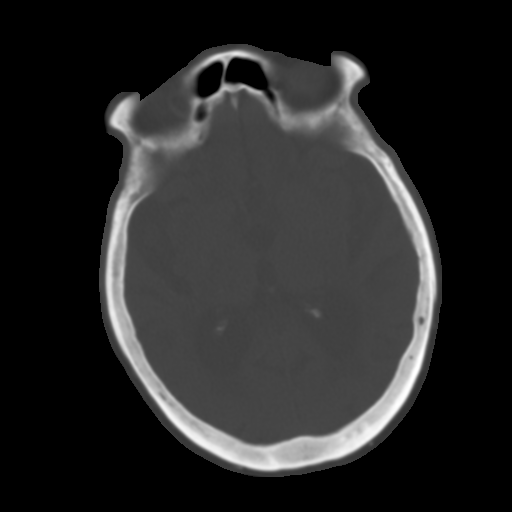
[im 19/31  brain]
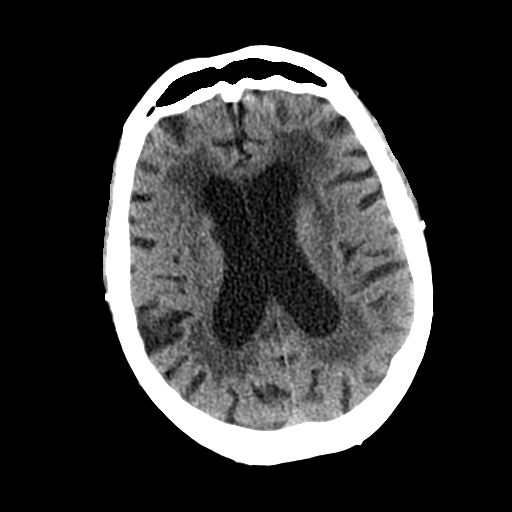
[im 22/31  brain]
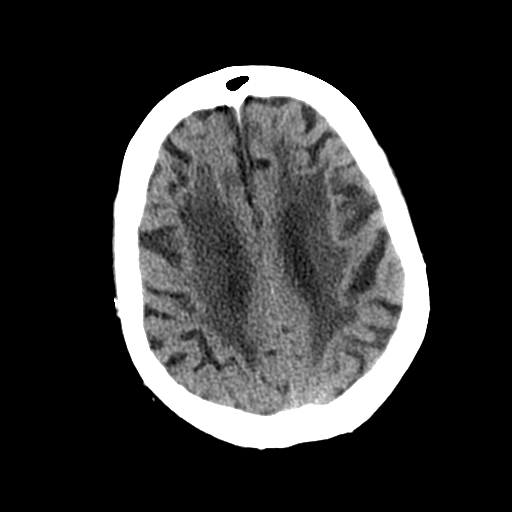
[im 25/31  brain]
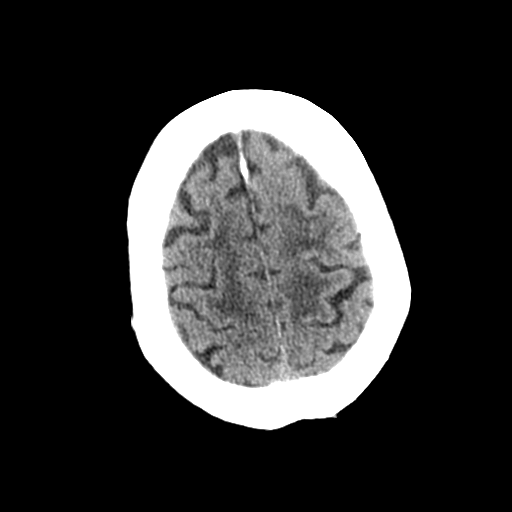
[im 28/31  brain]
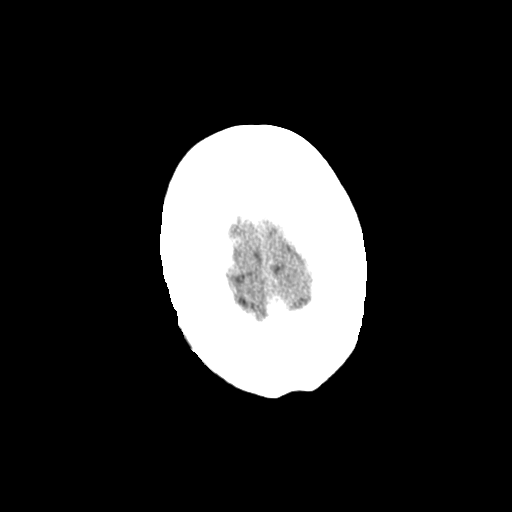
[im 28/31  bone]
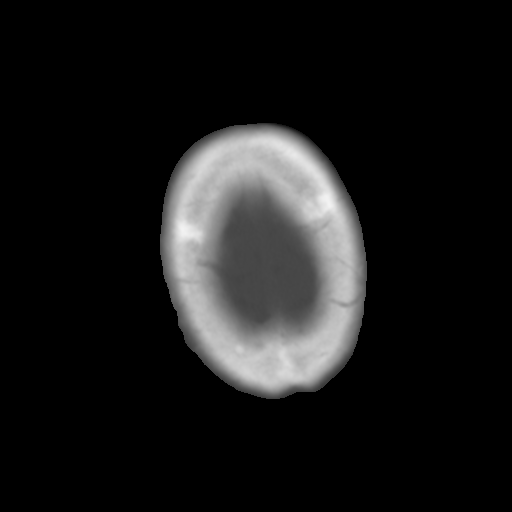

[Series 4: coronal soft · coronal · 0.34mm/px · 3 of 81 slices shown]
[im 27/81  brain]
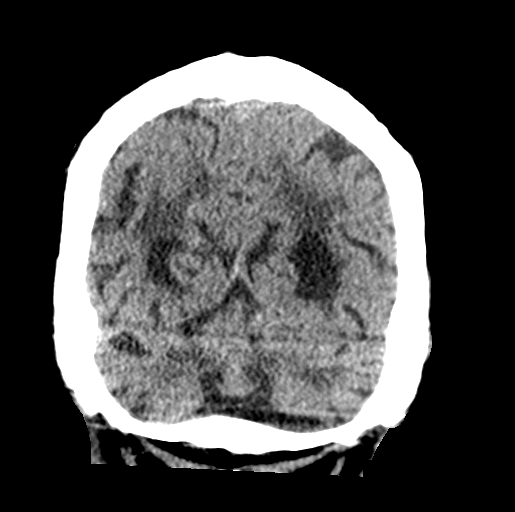
[im 36/81  brain]
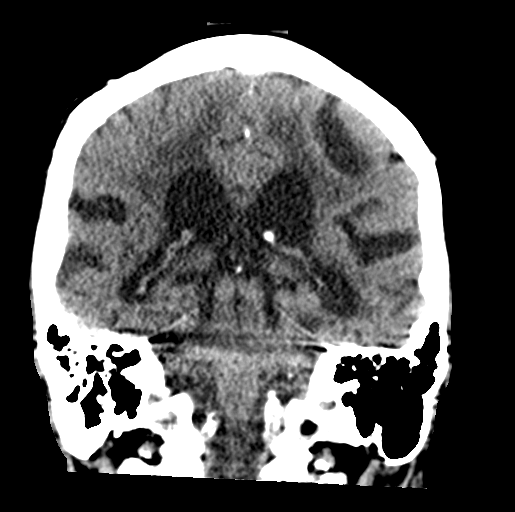
[im 45/81  brain]
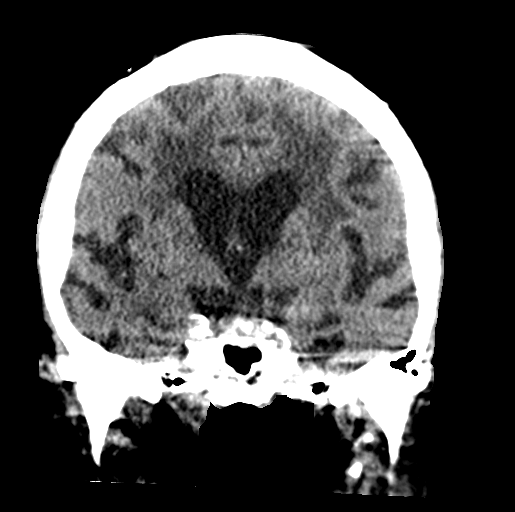

[Series 5: sagittal soft · sagittal · 0.34mm/px · 3 of 53 slices shown]
[im 18/53  brain]
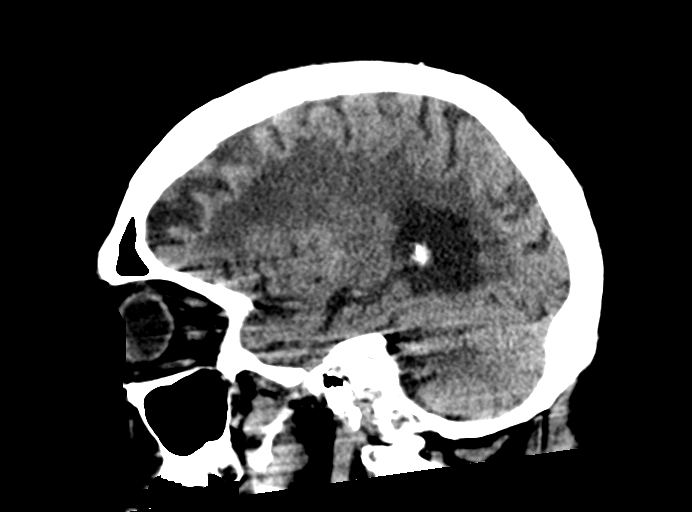
[im 27/53  brain]
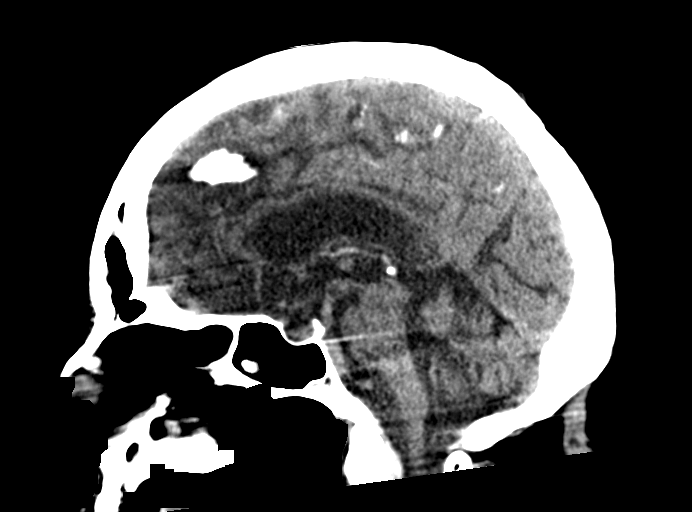
[im 35/53  brain]
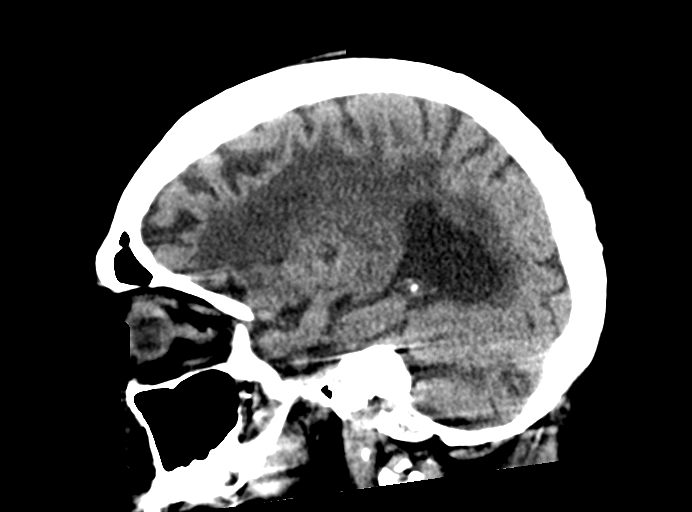

[15 of 47 positions shown; findings below may reference images not displayed]

FINDINGS: Brain: There is no mass, hemorrhage or extra-axial collection. The
size and configuration of the ventricles and extra-axial CSF spaces
are normal. There is hypoattenuation of the white matter, most
commonly indicating chronic small vessel disease.

Vascular: No abnormal hyperdensity of the major intracranial
arteries or dural venous sinuses. No intracranial atherosclerosis.

Skull: The visualized skull base, calvarium and extracranial soft
tissues are normal.

Sinuses/Orbits: No fluid levels or advanced mucosal thickening of
the visualized paranasal sinuses. No mastoid or middle ear effusion.
The orbits are normal.

Other: There is an inferior left parotid mass is incompletely
visualized but measures at least 2.5 cm. This is increased in size
compared to 08/14/2005.
IMPRESSION: 1. No acute intracranial abnormality.
2. Chronic small vessel disease.
3. Incompletely visualized inferior left parotid mass, measuring at
least 2.5 cm. This is increased in size compared to 08/14/2005.
